# Patient Record
Sex: Male | Born: 1950 | Race: Black or African American | Hispanic: No | Marital: Married | Smoking: Former smoker
Health system: Southern US, Community
[De-identification: ages and names within clinical notes are randomized; demographics above are authoritative.]

## PROBLEM LIST (undated history)

## (undated) DIAGNOSIS — G4733 Obstructive sleep apnea (adult) (pediatric): Secondary | ICD-10-CM

## (undated) DIAGNOSIS — G4730 Sleep apnea, unspecified: Secondary | ICD-10-CM

## (undated) DIAGNOSIS — I2510 Atherosclerotic heart disease of native coronary artery without angina pectoris: Secondary | ICD-10-CM

## (undated) DIAGNOSIS — F419 Anxiety disorder, unspecified: Secondary | ICD-10-CM

## (undated) DIAGNOSIS — I252 Old myocardial infarction: Secondary | ICD-10-CM

## (undated) DIAGNOSIS — K219 Gastro-esophageal reflux disease without esophagitis: Secondary | ICD-10-CM

## (undated) DIAGNOSIS — J189 Pneumonia, unspecified organism: Secondary | ICD-10-CM

## (undated) DIAGNOSIS — M1990 Unspecified osteoarthritis, unspecified site: Secondary | ICD-10-CM

## (undated) DIAGNOSIS — I10 Essential (primary) hypertension: Secondary | ICD-10-CM

## (undated) DIAGNOSIS — E785 Hyperlipidemia, unspecified: Secondary | ICD-10-CM

## (undated) DIAGNOSIS — I253 Aneurysm of heart: Secondary | ICD-10-CM

## (undated) HISTORY — DX: Obstructive sleep apnea (adult) (pediatric): G47.33

## (undated) HISTORY — PX: HERNIA REPAIR: SHX51

## (undated) HISTORY — DX: Hyperlipidemia, unspecified: E78.5

## (undated) HISTORY — PX: CORONARY STENT PLACEMENT: SHX1402

## (undated) HISTORY — DX: Essential (primary) hypertension: I10

## (undated) HISTORY — DX: Old myocardial infarction: I25.2

## (undated) HISTORY — DX: Atherosclerotic heart disease of native coronary artery without angina pectoris: I25.10

## (undated) HISTORY — PX: CARDIAC CATHETERIZATION: SHX172

## (undated) HISTORY — DX: Aneurysm of heart: I25.3

---

## 1998-08-04 ENCOUNTER — Emergency Department (HOSPITAL_COMMUNITY): Admission: EM | Admit: 1998-08-04 | Discharge: 1998-08-04 | Payer: Self-pay | Admitting: Emergency Medicine

## 1999-02-17 ENCOUNTER — Observation Stay (HOSPITAL_COMMUNITY): Admission: EM | Admit: 1999-02-17 | Discharge: 1999-02-18 | Payer: Self-pay | Admitting: Emergency Medicine

## 1999-02-18 ENCOUNTER — Encounter: Payer: Self-pay | Admitting: Cardiology

## 1999-12-09 ENCOUNTER — Emergency Department (HOSPITAL_COMMUNITY): Admission: EM | Admit: 1999-12-09 | Discharge: 1999-12-09 | Payer: Self-pay | Admitting: Emergency Medicine

## 2000-01-22 ENCOUNTER — Encounter (INDEPENDENT_AMBULATORY_CARE_PROVIDER_SITE_OTHER): Payer: Self-pay | Admitting: Specialist

## 2000-01-22 ENCOUNTER — Ambulatory Visit (HOSPITAL_COMMUNITY): Admission: RE | Admit: 2000-01-22 | Discharge: 2000-01-22 | Payer: Self-pay | Admitting: General Surgery

## 2000-06-18 ENCOUNTER — Emergency Department (HOSPITAL_COMMUNITY): Admission: EM | Admit: 2000-06-18 | Discharge: 2000-06-19 | Payer: Self-pay | Admitting: Emergency Medicine

## 2000-06-18 ENCOUNTER — Encounter: Payer: Self-pay | Admitting: Emergency Medicine

## 2001-12-12 ENCOUNTER — Emergency Department (HOSPITAL_COMMUNITY): Admission: EM | Admit: 2001-12-12 | Discharge: 2001-12-13 | Payer: Self-pay | Admitting: Emergency Medicine

## 2003-07-29 ENCOUNTER — Emergency Department (HOSPITAL_COMMUNITY): Admission: EM | Admit: 2003-07-29 | Discharge: 2003-07-29 | Payer: Self-pay | Admitting: Emergency Medicine

## 2005-08-29 ENCOUNTER — Emergency Department (HOSPITAL_COMMUNITY): Admission: EM | Admit: 2005-08-29 | Discharge: 2005-08-29 | Payer: Self-pay | Admitting: Emergency Medicine

## 2005-12-21 ENCOUNTER — Emergency Department (HOSPITAL_COMMUNITY): Admission: EM | Admit: 2005-12-21 | Discharge: 2005-12-21 | Payer: Self-pay | Admitting: Emergency Medicine

## 2007-02-02 ENCOUNTER — Encounter: Admission: RE | Admit: 2007-02-02 | Discharge: 2007-02-02 | Payer: Self-pay | Admitting: Family Medicine

## 2008-04-05 ENCOUNTER — Emergency Department (HOSPITAL_COMMUNITY): Admission: EM | Admit: 2008-04-05 | Discharge: 2008-04-05 | Payer: Self-pay | Admitting: Emergency Medicine

## 2008-06-24 ENCOUNTER — Emergency Department (HOSPITAL_COMMUNITY): Admission: EM | Admit: 2008-06-24 | Discharge: 2008-06-24 | Payer: Self-pay | Admitting: Emergency Medicine

## 2009-05-14 ENCOUNTER — Emergency Department (HOSPITAL_COMMUNITY): Admission: EM | Admit: 2009-05-14 | Discharge: 2009-05-14 | Payer: Self-pay | Admitting: Emergency Medicine

## 2010-03-31 LAB — DIFFERENTIAL
Basophils Absolute: 0 10*3/uL (ref 0.0–0.1)
Basophils Relative: 1 % (ref 0–1)
Eosinophils Absolute: 0 10*3/uL (ref 0.0–0.7)
Eosinophils Relative: 0 % (ref 0–5)
Lymphocytes Relative: 27 % (ref 12–46)
Lymphs Abs: 1.3 10*3/uL (ref 0.7–4.0)
Monocytes Absolute: 0.5 10*3/uL (ref 0.1–1.0)
Monocytes Relative: 10 % (ref 3–12)
Neutro Abs: 3.1 10*3/uL (ref 1.7–7.7)
Neutrophils Relative %: 63 % (ref 43–77)

## 2010-03-31 LAB — COMPREHENSIVE METABOLIC PANEL
ALT: 32 U/L (ref 0–53)
AST: 28 U/L (ref 0–37)
Albumin: 4.1 g/dL (ref 3.5–5.2)
Alkaline Phosphatase: 67 U/L (ref 39–117)
BUN: 12 mg/dL (ref 6–23)
CO2: 27 mEq/L (ref 19–32)
Calcium: 8.7 mg/dL (ref 8.4–10.5)
Chloride: 107 mEq/L (ref 96–112)
Creatinine, Ser: 1.08 mg/dL (ref 0.4–1.5)
GFR calc non Af Amer: >60 mL/min (ref >60)
Glucose, Bld: 100 mg/dL — ABNORMAL HIGH (ref 70–99)
Potassium: 4.5 mEq/L (ref 3.5–5.1)
Sodium: 139 mEq/L (ref 135–145)
Total Bilirubin: 0.4 mg/dL (ref 0.3–1.2)
Total Protein: 7.1 g/dL (ref 6.0–8.3)

## 2010-03-31 LAB — APTT: aPTT: 28 seconds (ref 24–37)

## 2010-03-31 LAB — URINE MICROSCOPIC-ADD ON

## 2010-03-31 LAB — CBC
HCT: 45 % (ref 39.0–52.0)
Hemoglobin: 15.5 g/dL (ref 13.0–17.0)
MCHC: 34.5 g/dL (ref 30.0–36.0)
MCV: 92.5 fL (ref 78.0–100.0)
Platelets: 194 10*3/uL (ref 150–400)
RBC: 4.86 MIL/uL (ref 4.22–5.81)
RDW: 13.3 % (ref 11.5–15.5)
WBC: 4.9 10*3/uL (ref 4.0–10.5)

## 2010-03-31 LAB — URINE CULTURE
Colony Count: NO GROWTH
Culture: NO GROWTH

## 2010-03-31 LAB — URINALYSIS, ROUTINE W REFLEX MICROSCOPIC
Bilirubin Urine: NEGATIVE
Glucose, UA: NEGATIVE mg/dL
Ketones, ur: NEGATIVE mg/dL
Leukocytes, UA: NEGATIVE
Nitrite: NEGATIVE
Protein, ur: NEGATIVE mg/dL
Specific Gravity, Urine: 1.015 (ref 1.005–1.030)
Urobilinogen, UA: 0.2 mg/dL (ref 0.0–1.0)
pH: 6 (ref 5.0–8.0)

## 2010-03-31 LAB — PROTIME-INR
INR: 0.96 (ref 0.00–1.49)
Prothrombin Time: 12.7 seconds (ref 11.6–15.2)

## 2010-03-31 LAB — TROPONIN I

## 2010-03-31 LAB — LACTIC ACID, PLASMA: Lactic Acid, Venous: 1.4 mmol/L (ref 0.5–2.2)

## 2010-03-31 LAB — CK TOTAL AND CKMB (NOT AT ARMC)
CK, MB: 1.4 ng/mL (ref 0.3–4.0)
Relative Index: 0.7 (ref 0.0–2.5)
Total CK: 205 U/L (ref 7–232)

## 2010-04-20 LAB — POCT I-STAT, CHEM 8
BUN: 17 mg/dL (ref 6–23)
Calcium, Ion: 1.12 mmol/L (ref 1.12–1.32)
Chloride: 106 mEq/L (ref 96–112)
Creatinine, Ser: 1.1 mg/dL (ref 0.4–1.5)
Glucose, Bld: 93 mg/dL (ref 70–99)
HCT: 42 % (ref 39.0–52.0)
Hemoglobin: 14.3 g/dL (ref 13.0–17.0)
Potassium: 3.7 mEq/L (ref 3.5–5.1)
Sodium: 140 mEq/L (ref 135–145)
TCO2: 25 mmol/L (ref 0–100)

## 2010-05-29 NOTE — Op Note (Signed)
[place]  Patient:    Lawrence [name], Lawrence [name]                        MRN: [id number] Proc. Date: 01/22/00 Adm. Date:  [id number] Disc. Date: [id number] Attending:  Sonda Primes                           Operative Report  PREOPERATIVE DIAGNOSIS:  Right inguinal hernia.  POSTOPERATIVE DIAGNOSIS:  Right direct and indirect inguinal hernia.  PROCEDURE:  Right inguinal herniorrhaphy with repair with mesh patch.  SURGEON:  Mardene Celeste. Lurene Shadow, M.D.  ASSISTANT:  Nurse.  ANESTHESIA:  General.  INDICATIONS:  This patient is a 60 year old man presenting with a right-sided groin bulge extending down into the scrotum.  He was brought for repair of this right side inguinal hernia.  DESCRIPTION OF PROCEDURE:  Following the induction of anesthesia, first with sedation, the right groin was infiltrated with 1% Xylocaine with epinephrine. We eventually switched to general anesthetic due to the patients ongoing discomfort despite much medication.  Dissection was carried down through the skin and subcutaneous tissues to the external oblique aponeurosis.  This was opened up through the external inguinal ring with protection of the ilioinguinal nerve, which was retracted inferiorly and laterally.  Spermatic cord was elevated from the floor.  It contained a very large indirect sac, which was dissected free from the surrounding spermatic cord up to the internal ring.  The sac was opened and the incarcerated omentum in the sac was dissected free from the sac wall and reduced into the peritoneal cavity.  The sac was then suture ligated at its base with 2-0 silk sutures.  Redundant sac was amputated and forwarded for pathologic evaluation.  The floor of the inguinal canal then repaired with an onlay patch of Prolene mesh sewn in at the pubic tubercle with a 2-0 Novofil continued up along the conjoin tendon to the internal ring and then again from the pubic tubercle up  along the shelving edge of Pouparts ligament to the internal ring.  The mesh was split so as to allow normal protrusion of the spermatic cord and the tails of the mesh were then sutured down to the internal oblique muscles with 2-0 Prolene sutures. Sponge, instrument and sharp counts were then verified, spermatic cord returned to its usual anatomic position and the external oblique aponeurosis closed over the cord with a running 2-0 Vicryl so as to reapproximate the external ring.  Subcutaneous tissues were irrigated.  Additional bleeding points were treated with electrocautery and closed with a running suture of 3-0 Vicryl.  Skin was closed with a 4-0 Monocryl running subcuticular stitch. Wounds were then reinforced with Steri-Strips and sterile dressings were applied.  Anesthetic reversed and the patient removed from the operating room to the recovery room in stable condition.  He tolerated this procedure well. DD:  01/22/00 TD:  01/22/00 Job: [id number]

## 2010-05-29 NOTE — Discharge Summary (Signed)
[place]  Patient:    Lawrence [name], Lawrence [name]                        MRN: [id number] Adm. Date:  [id number] Disc. Date: 02/18/99 Attending:  Ronaldo Miyamoto Dictator:   Delton See, P.A. CC:         Lindell Spar. Chestine Spore, M.D.             Dietrich Pates, M.D., [facility]                           Discharge Summary  BRIEF HISTORY OF PRESENT ILLNESS:  Lawrence [name] is a 60 year old male with a history of chest pain radiating to his left arm associated with tingling of his arm. In  January of 2000, he had an exercise stress test. In January of 2000, he performed 11 minutes, 16 seconds of the Bruce protocol at 12.9 mets without EKG changes. e was seen in the emergency room in August of 2000 with similar symptoms and again February 16, 1999 at [facility]. Enzymes and an EKG revealed no  significant changes. The patient described the pain as epigastric and radiating to the left side of his chest, sharp, worse with deep inspiration, associated with  mild diaphoresis, left arm tingling, better in the emergency room following nitroglycerin x 1, no history of exertional symptoms. He was admitted on February 17, 1999 for further evaluation of the above noted symptoms.  PAST MEDICAL HISTORY:  The patient has elevated cholesterol levels with an LDL f 170. He has a history of sleep apnea and is on CPAP. He has a history of a hernia repair.  ALLERGIES:  No known drug allergies.  MEDICATIONS PRIOR TO ADMISSION:  None.  SOCIAL HISTORY:  The patient is married. He works in Airline pilot. He quit smoking 5 years ago.  HOSPITAL COURSE:  As noted, this patient was admitted for further evaluation of  atypical chest pain. He was scheduled for an exercise Cardiolite. This was performed on February 18, 1999. The patient exercised 9 minutes and 35 seconds reaching stage 4. There were no EKG changes and no symptoms other than shortness of breath and fatigue. The  preliminary Cardiolite report showed no ischemia, ejection fraction of 59%. Arrangements were made to discharge the patient later that evening with follow up with ______ . The patient also requested follow up with Dietrich Pates, M.D. He was told to call the office the following morning to get an appointment to see her.  LABORATORY DATA:  A troponin I was 0.3. A comprehensive metabolic panel was within normal limits. A CBC was within normal limits with hemoglobin 13.6, hematocrit 9, WBC 5.9000, platelets 238,000. A repeat troponin I was 0.04. Comprehensive metabolic panel on admission was also within normal limits with a BUN of 17, creatinine 1.2, potassium 3.9. A repeat CBC was also normal.  There was no chest x-ray report in the chart or in the computer at this time.  There is no EKG in the chart at this time.  DISCHARGE MEDICATIONS:  The patient was instructed to try some over-the-counter  Pepcid as needed for symptoms.  ACTIVITIES:  As tolerated.  DIET:  He was told to stay on a low-fat diet.  FOLLOW-UP:  He was to follow up with Lindell Spar. Chestine Spore, M.D. in two to three weeks, Dietrich Pates, M.D. he was told to call the office in the  morning for a follow-up appointment.  PROBLEM LIST AT TIME OF DISCHARGE: 1. Atypical chest pain. 2. Negative exercise Cardiolite performed February 18, 1999. No ischemia. Ejection    fraction 59%. 3. History of elevated LDL levels. 4. Former smoker. 5. Previous exercise treadmill January 2000 which was also negative.  The patient may need a referral to a gastroenterologist if symptoms continue. DD:  02/18/99 TD:  02/18/99 Job: [id number]

## 2010-07-22 ENCOUNTER — Telehealth: Payer: Self-pay | Admitting: Internal Medicine

## 2010-07-22 NOTE — Telephone Encounter (Signed)
I did not see any record of the pt seeing CY here in this office so I had Lawrence [name] check in old system and pt last saw CY in 2001 and the chart was here in [facility] records, so I transferred Lawrence [name] to [employer]. Lawrence [name], CMA

## 2012-01-04 ENCOUNTER — Emergency Department (INDEPENDENT_AMBULATORY_CARE_PROVIDER_SITE_OTHER): Payer: BC Managed Care – PPO

## 2012-01-04 ENCOUNTER — Emergency Department (HOSPITAL_COMMUNITY)
Admission: EM | Admit: 2012-01-04 | Discharge: 2012-01-04 | Disposition: A | Discharge status: Home / Self Care | Payer: BC Managed Care – PPO | Source: Home / Self Care | Attending: Emergency Medicine | Admitting: Emergency Medicine

## 2012-01-04 ENCOUNTER — Encounter (HOSPITAL_COMMUNITY): Payer: Self-pay | Admitting: *Deleted

## 2012-01-04 DIAGNOSIS — J209 Acute bronchitis, unspecified: Secondary | ICD-10-CM

## 2012-01-04 DIAGNOSIS — J986 Disorders of diaphragm: Secondary | ICD-10-CM

## 2012-01-04 HISTORY — DX: Essential (primary) hypertension: I10

## 2012-01-04 MED ORDER — AZITHROMYCIN 250 MG PO TABS: ORAL_TABLET | ORAL | Status: DC

## 2012-01-04 MED ORDER — HYDROCOD POLST-CHLORPHEN POLST 10-8 MG/5ML PO LQCR: 5.0000 mL | Freq: Two times a day (BID) | ORAL | Status: DC | PRN

## 2012-01-04 MED ORDER — ALBUTEROL SULFATE HFA 108 (90 BASE) MCG/ACT IN AERS: 2.0000 | INHALATION_SPRAY | Freq: Four times a day (QID) | RESPIRATORY_TRACT | Status: DC | PRN

## 2012-01-04 NOTE — ED Notes (Signed)
Pt  Reports  Symptoms  Of  Cough  /  Congested     As     Well  As  Discomfort  In  Chest  When  He  Coughs   He  Reports   Yellow  /  Blood  Tinged       Mucous       Returned        He  Is  Sitting  Upright on  Exam table  He  Is  Speaking in  Complete  sentances   He   Appears  In no    Cute  Distress

## 2012-01-04 NOTE — ED Provider Notes (Signed)
Chief Complaint  Patient presents with  . 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 He denies any wheezing, shortness of breath, fever, or chills. He has had no GI symptoms. He also has high blood pressure and hyperlipidemia and is on lisinopril and pravastatin.  Review of Systems:  Other than noted above, the patient denies any of the following symptoms. Systemic:  No fever, chills, sweats, fatigue, myalgias, headache, or anorexia. Eye:  No redness, pain or drainage. ENT:  No earache, ear congestion, nasal congestion, sneezing, rhinorrhea, sinus pressure, sinus pain, post nasal drip, or sore throat. Lungs:  No cough, sputum production, wheezing, shortness of breath, or chest pain. GI:  No abdominal pain, nausea, vomiting, or diarrhea.  PMFSH:  Past medical history, family history, social history, meds, and allergies were reviewed.  Physical Exam:   Vital signs:  BP 145/95  Pulse 96  Temp 98.4 F (36.9 C) (Oral)  Resp 18  SpO2 96% General:  Alert, in no distress. Eye:  No conjunctival injection or drainage. Lids were normal. ENT:  TMs and canals were normal, without erythema or inflammation.  Nasal mucosa was clear and uncongested, without drainage.  Mucous membranes were moist.  Pharynx was clear, without exudate or drainage.  There were no oral ulcerations or lesions. Neck:  Supple, no adenopathy, tenderness or mass. Lungs:  No respiratory distress.  Lungs were clear to auscultation, without wheezes, rales or rhonchi.  Breath sounds were clear and equal bilaterally.  Heart:  Regular rhythm, without gallops, murmers or rubs. Skin:  Clear, warm, and dry, without rash or lesions.  Radiology:  Dg Chest 2 View  01/04/2012  *RADIOLOGY REPORT*  Clinical Data: Cough and hemoptysis   CHEST - 2 VIEW  Comparison: May 14, 2009  Findings: There is stable elevation of the right hemidiaphragm. Lungs clear.  Heart size and pulmonary vascularity are normal.  No adenopathy.  No bone lesions.  IMPRESSION: No edema or consolidation.  If hemoptysis persists, correlation with chest CT may be advisable.   Original Report Authenticated By: Bretta Bang, M.D.    I reviewed the images independently and personally and concur with the radiologist's findings.  Assessment:  The primary encounter diagnosis was Acute bronchitis. 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 Patient was unaware that this has been diagnosed with this. He's not had any previous respiratory problems. I do not think this requires any further workup. I told him that as result of this he may be more susceptible to getting pneumonia, thus I elected to go ahead and start a Z-Pak, even though he only been sick for 2 or 3 days.  Plan:   1.  The following meds were prescribed:   New Prescriptions   ALBUTEROL (PROVENTIL HFA;VENTOLIN HFA) 108 (90 BASE) MCG/ACT INHALER    Inhale 2 puffs into the lungs every 6 (six) hours as needed for wheezing.   AZITHROMYCIN (ZITHROMAX Z-PAK) 250 MG TABLET    Take as directed.   CHLORPHENIRAMINE-HYDROCODONE (TUSSIONEX) 10-8 MG/5ML LQCR    Take 5 mLs by mouth every 12 (twelve) hours as needed.   2.  The patient was instructed in symptomatic care and  handouts were given. 3.  The patient was told to return if becoming worse in any way, if no better in 3 or 4 days, and given some red flag symptoms that would indicate earlier return.   Reuben Likes, MD 01/04/12 1332

## 2012-01-22 ENCOUNTER — Inpatient Hospital Stay (HOSPITAL_COMMUNITY)
Admission: EM | Admit: 2012-01-22 | Discharge: 2012-01-27 | DRG: 808 | Disposition: A | Discharge status: Home / Self Care | Payer: BC Managed Care – PPO | Attending: Interventional Cardiology | Admitting: Interventional Cardiology

## 2012-01-22 ENCOUNTER — Encounter (HOSPITAL_COMMUNITY): Payer: Self-pay | Admitting: *Deleted

## 2012-01-22 DIAGNOSIS — G4733 Obstructive sleep apnea (adult) (pediatric): Secondary | ICD-10-CM | POA: Diagnosis present

## 2012-01-22 DIAGNOSIS — I214 Non-ST elevation (NSTEMI) myocardial infarction: Principal | ICD-10-CM | POA: Diagnosis present

## 2012-01-22 DIAGNOSIS — I2582 Chronic total occlusion of coronary artery: Secondary | ICD-10-CM | POA: Diagnosis present

## 2012-01-22 DIAGNOSIS — R079 Chest pain, unspecified: Secondary | ICD-10-CM | POA: Diagnosis present

## 2012-01-22 DIAGNOSIS — E669 Obesity, unspecified: Secondary | ICD-10-CM | POA: Diagnosis present

## 2012-01-22 DIAGNOSIS — I10 Essential (primary) hypertension: Secondary | ICD-10-CM | POA: Diagnosis present

## 2012-01-22 DIAGNOSIS — I253 Aneurysm of heart: Secondary | ICD-10-CM | POA: Diagnosis present

## 2012-01-22 DIAGNOSIS — E785 Hyperlipidemia, unspecified: Secondary | ICD-10-CM | POA: Diagnosis present

## 2012-01-22 DIAGNOSIS — I252 Old myocardial infarction: Secondary | ICD-10-CM | POA: Diagnosis not present

## 2012-01-22 DIAGNOSIS — I2510 Atherosclerotic heart disease of native coronary artery without angina pectoris: Secondary | ICD-10-CM | POA: Diagnosis present

## 2012-01-22 DIAGNOSIS — Z7901 Long term (current) use of anticoagulants: Secondary | ICD-10-CM

## 2012-01-22 DIAGNOSIS — F17200 Nicotine dependence, unspecified, uncomplicated: Secondary | ICD-10-CM | POA: Diagnosis present

## 2012-01-22 NOTE — ED Notes (Signed)
Pt c/o sudden onset of burning chest pain when he was climbing stairs. Pt c/o slight radiation to L arm. Pt denies prior cardiac hx beyond HTN. Pt denies n/v at this time. Pt presents as pale, slightly diaphoretic and in some visible distress.

## 2012-01-23 ENCOUNTER — Emergency Department (HOSPITAL_COMMUNITY): Payer: BC Managed Care – PPO

## 2012-01-23 ENCOUNTER — Encounter (HOSPITAL_COMMUNITY)
Admission: EM | Disposition: A | Discharge status: Home / Self Care | Payer: Self-pay | Source: Home / Self Care | Attending: Interventional Cardiology

## 2012-01-23 ENCOUNTER — Encounter (HOSPITAL_COMMUNITY): Payer: Self-pay | Admitting: Emergency Medicine

## 2012-01-23 DIAGNOSIS — I10 Essential (primary) hypertension: Secondary | ICD-10-CM | POA: Diagnosis present

## 2012-01-23 DIAGNOSIS — I252 Old myocardial infarction: Secondary | ICD-10-CM

## 2012-01-23 DIAGNOSIS — R079 Chest pain, unspecified: Secondary | ICD-10-CM

## 2012-01-23 HISTORY — DX: Essential (primary) hypertension: I10

## 2012-01-23 HISTORY — PX: LEFT HEART CATHETERIZATION WITH CORONARY ANGIOGRAM: SHX5451

## 2012-01-23 HISTORY — DX: Old myocardial infarction: I25.2

## 2012-01-23 LAB — PRO B NATRIURETIC PEPTIDE
Pro B Natriuretic peptide (BNP): <5 pg/mL (ref 0–125)
Pro B Natriuretic peptide (BNP): 128.6 pg/mL — ABNORMAL HIGH (ref 0–125)

## 2012-01-23 LAB — CBC WITH DIFFERENTIAL/PLATELET
Basophils Absolute: 0.1 10*3/uL (ref 0.0–0.1)
Basophils Relative: 1 % (ref 0–1)
Eosinophils Absolute: 0.4 10*3/uL (ref 0.0–0.7)
Eosinophils Relative: 3 % (ref 0–5)
HCT: 42.2 % (ref 39.0–52.0)
Hemoglobin: 14.9 g/dL (ref 13.0–17.0)
Lymphocytes Relative: 56 % — ABNORMAL HIGH (ref 12–46)
Lymphs Abs: 6.8 10*3/uL — ABNORMAL HIGH (ref 0.7–4.0)
MCH: 32.1 pg (ref 26.0–34.0)
MCHC: 35.3 g/dL (ref 30.0–36.0)
MCV: 90.9 fL (ref 78.0–100.0)
Monocytes Absolute: 0.8 10*3/uL (ref 0.1–1.0)
Monocytes Relative: 7 % (ref 3–12)
Neutro Abs: 4 10*3/uL (ref 1.7–7.7)
Neutrophils Relative %: 33 % — ABNORMAL LOW (ref 43–77)
Platelets: 256 10*3/uL (ref 150–400)
RBC: 4.64 MIL/uL (ref 4.22–5.81)
RDW: 13.3 % (ref 11.5–15.5)
WBC: 12.1 10*3/uL — ABNORMAL HIGH (ref 4.0–10.5)

## 2012-01-23 LAB — TROPONIN I
Troponin I: 1.52 ng/mL (ref <0.30)
Troponin I: 8.02 ng/mL (ref <0.30)
Troponin I: >20 ng/mL (ref <0.30)

## 2012-01-23 LAB — POCT I-STAT, CHEM 8
BUN: 23 mg/dL (ref 6–23)
Calcium, Ion: 1.08 mmol/L — ABNORMAL LOW (ref 1.12–1.23)
Chloride: 108 mEq/L (ref 96–112)
Creatinine, Ser: 1.2 mg/dL (ref 0.50–1.35)
Glucose, Bld: 109 mg/dL — ABNORMAL HIGH (ref 70–99)
HCT: 44 % (ref 39.0–52.0)
Hemoglobin: 15 g/dL (ref 13.0–17.0)
Potassium: 4 mEq/L (ref 3.5–5.1)
Sodium: 139 mEq/L (ref 135–145)
TCO2: 25 mmol/L (ref 0–100)

## 2012-01-23 LAB — CBC
HCT: 41.1 % (ref 39.0–52.0)
Hemoglobin: 14.1 g/dL (ref 13.0–17.0)
MCH: 31.4 pg (ref 26.0–34.0)
MCHC: 34.3 g/dL (ref 30.0–36.0)
MCV: 91.5 fL (ref 78.0–100.0)
Platelets: 253 10*3/uL (ref 150–400)
RBC: 4.49 MIL/uL (ref 4.22–5.81)
RDW: 13.3 % (ref 11.5–15.5)
WBC: 8.1 10*3/uL (ref 4.0–10.5)

## 2012-01-23 LAB — BASIC METABOLIC PANEL
BUN: 17 mg/dL (ref 6–23)
CO2: 22 mEq/L (ref 19–32)
Calcium: 8.9 mg/dL (ref 8.4–10.5)
Chloride: 101 mEq/L (ref 96–112)
Creatinine, Ser: 0.88 mg/dL (ref 0.50–1.35)
GFR calc Af Amer: >90 mL/min (ref >90)
GFR calc non Af Amer: >90 mL/min (ref >90)
Glucose, Bld: 151 mg/dL — ABNORMAL HIGH (ref 70–99)
Potassium: 4.2 mEq/L (ref 3.5–5.1)
Sodium: 137 mEq/L (ref 135–145)

## 2012-01-23 LAB — PROTIME-INR
INR: 1 (ref 0.00–1.49)
Prothrombin Time: 13.1 seconds (ref 11.6–15.2)

## 2012-01-23 LAB — MAGNESIUM: Magnesium: 2 mg/dL (ref 1.5–2.5)

## 2012-01-23 LAB — POCT I-STAT TROPONIN I: Troponin i, poc: 0 ng/mL (ref 0.00–0.08)

## 2012-01-23 LAB — LIPID PANEL
Cholesterol: 252 mg/dL — ABNORMAL HIGH (ref 0–200)
HDL: 41 mg/dL (ref >39)
LDL Cholesterol: 167 mg/dL — ABNORMAL HIGH (ref 0–99)
Total CHOL/HDL Ratio: 6.1 RATIO
Triglycerides: 220 mg/dL — ABNORMAL HIGH (ref <150)
VLDL: 44 mg/dL — ABNORMAL HIGH (ref 0–40)

## 2012-01-23 LAB — TSH: TSH: 2.476 u[IU]/mL (ref 0.350–4.500)

## 2012-01-23 LAB — APTT: aPTT: 29 seconds (ref 24–37)

## 2012-01-23 LAB — HEMOGLOBIN A1C
Hgb A1c MFr Bld: 5.9 % — ABNORMAL HIGH (ref <5.7)
Mean Plasma Glucose: 123 mg/dL — ABNORMAL HIGH (ref <117)

## 2012-01-23 LAB — MRSA PCR SCREENING: MRSA by PCR: NEGATIVE

## 2012-01-23 SURGERY — LEFT HEART CATHETERIZATION WITH CORONARY ANGIOGRAM
Anesthesia: LOCAL

## 2012-01-23 MED ORDER — SODIUM CHLORIDE 0.9 % IJ SOLN: 3.0000 mL | Freq: Two times a day (BID) | INTRAMUSCULAR | Status: DC

## 2012-01-23 MED ORDER — NITROGLYCERIN 0.2 MG/ML ON CALL CATH LAB
INTRAVENOUS | Status: AC
  Filled 2012-01-23: qty 1

## 2012-01-23 MED ORDER — ASPIRIN EC 81 MG PO TBEC: 81.0000 mg | DELAYED_RELEASE_TABLET | Freq: Every day | ORAL | Status: DC

## 2012-01-23 MED ORDER — BIVALIRUDIN 250 MG IV SOLR
INTRAVENOUS | Status: AC
  Filled 2012-01-23: qty 250

## 2012-01-23 MED ORDER — IOHEXOL 350 MG/ML SOLN: 100.0000 mL | Freq: Once | INTRAVENOUS | Status: DC | PRN

## 2012-01-23 MED ORDER — SODIUM CHLORIDE 0.9 % IV SOLN: INTRAVENOUS | Status: DC

## 2012-01-23 MED ORDER — NITROGLYCERIN 0.4 MG SL SUBL
0.4000 mg | SUBLINGUAL_TABLET | SUBLINGUAL | Status: DC | PRN
  Administered 2012-01-23 (×2): 0.4 mg via SUBLINGUAL
  Filled 2012-01-23: qty 25

## 2012-01-23 MED ORDER — TICAGRELOR 90 MG PO TABS
90.0000 mg | ORAL_TABLET | Freq: Two times a day (BID) | ORAL | Status: DC
  Filled 2012-01-23 (×2): qty 1

## 2012-01-23 MED ORDER — LIDOCAINE HCL (PF) 1 % IJ SOLN
INTRAMUSCULAR | Status: AC
  Filled 2012-01-23: qty 30

## 2012-01-23 MED ORDER — FENTANYL CITRATE 0.05 MG/ML IJ SOLN
100.0000 ug | Freq: Once | INTRAMUSCULAR | Status: AC
  Administered 2012-01-23: 100 ug via INTRAVENOUS
  Filled 2012-01-23: qty 2

## 2012-01-23 MED ORDER — OXYCODONE-ACETAMINOPHEN 5-325 MG PO TABS
1.0000 | ORAL_TABLET | ORAL | Status: DC | PRN
  Administered 2012-01-23 (×2): 2 via ORAL
  Filled 2012-01-23 (×2): qty 2

## 2012-01-23 MED ORDER — DIAZEPAM 2 MG PO TABS: 2.0000 mg | ORAL_TABLET | ORAL | Status: DC

## 2012-01-23 MED ORDER — ATORVASTATIN CALCIUM 80 MG PO TABS
80.0000 mg | ORAL_TABLET | Freq: Every day | ORAL | Status: DC
  Administered 2012-01-23 – 2012-01-26 (×4): 80 mg via ORAL
  Filled 2012-01-23 (×5): qty 1

## 2012-01-23 MED ORDER — LISINOPRIL 10 MG PO TABS: 10.0000 mg | ORAL_TABLET | Freq: Every day | ORAL | Status: DC

## 2012-01-23 MED ORDER — ENOXAPARIN SODIUM 40 MG/0.4ML ~~LOC~~ SOLN
40.0000 mg | SUBCUTANEOUS | Status: DC
  Administered 2012-01-24: 40 mg via SUBCUTANEOUS
  Filled 2012-01-23 (×2): qty 0.4

## 2012-01-23 MED ORDER — SODIUM CHLORIDE 0.9 % IV SOLN
INTRAVENOUS | Status: AC
  Administered 2012-01-23: 13:00:00 via INTRAVENOUS

## 2012-01-23 MED ORDER — FENTANYL CITRATE 0.05 MG/ML IJ SOLN
INTRAMUSCULAR | Status: AC
  Filled 2012-01-23: qty 2

## 2012-01-23 MED ORDER — METOPROLOL TARTRATE 25 MG PO TABS
25.0000 mg | ORAL_TABLET | Freq: Two times a day (BID) | ORAL | Status: DC
  Administered 2012-01-23: 25 mg via ORAL
  Administered 2012-01-23: 12.5 mg via ORAL
  Filled 2012-01-23 (×4): qty 1

## 2012-01-23 MED ORDER — NITROGLYCERIN 0.4 MG SL SUBL: 0.4000 mg | SUBLINGUAL_TABLET | SUBLINGUAL | Status: DC | PRN

## 2012-01-23 MED ORDER — IOHEXOL 350 MG/ML SOLN
125.0000 mL | Freq: Once | INTRAVENOUS | Status: AC | PRN
  Administered 2012-01-23: 125 mL via INTRAVENOUS

## 2012-01-23 MED ORDER — MORPHINE SULFATE 2 MG/ML IJ SOLN
4.0000 mg | INTRAMUSCULAR | Status: DC | PRN
  Administered 2012-01-23: 2 mg via INTRAVENOUS
  Administered 2012-01-23 (×3): 4 mg via INTRAVENOUS
  Filled 2012-01-23: qty 2
  Filled 2012-01-23: qty 1
  Filled 2012-01-23: qty 2
  Filled 2012-01-23: qty 1

## 2012-01-23 MED ORDER — METOPROLOL TARTRATE 12.5 MG HALF TABLET
12.5000 mg | ORAL_TABLET | Freq: Two times a day (BID) | ORAL | Status: DC
  Administered 2012-01-23 (×2): 12.5 mg via ORAL
  Filled 2012-01-23 (×3): qty 1

## 2012-01-23 MED ORDER — TICAGRELOR 90 MG PO TABS
ORAL_TABLET | ORAL | Status: AC
  Filled 2012-01-23: qty 2

## 2012-01-23 MED ORDER — ASPIRIN 81 MG PO CHEW
81.0000 mg | CHEWABLE_TABLET | Freq: Every day | ORAL | Status: DC
  Administered 2012-01-23 – 2012-01-27 (×5): 81 mg via ORAL
  Filled 2012-01-23 (×5): qty 1

## 2012-01-23 MED ORDER — KETOROLAC TROMETHAMINE 60 MG/2ML IM SOLN
60.0000 mg | Freq: Once | INTRAMUSCULAR | Status: AC
  Administered 2012-01-23: 60 mg via INTRAMUSCULAR
  Filled 2012-01-23: qty 2

## 2012-01-23 MED ORDER — ONDANSETRON HCL 4 MG/2ML IJ SOLN: 4.0000 mg | Freq: Four times a day (QID) | INTRAMUSCULAR | Status: DC | PRN

## 2012-01-23 MED ORDER — ENOXAPARIN SODIUM 40 MG/0.4ML ~~LOC~~ SOLN
40.0000 mg | SUBCUTANEOUS | Status: DC
  Filled 2012-01-23: qty 0.4

## 2012-01-23 MED ORDER — ASPIRIN 325 MG PO TABS: 325.0000 mg | ORAL_TABLET | ORAL | Status: DC

## 2012-01-23 MED ORDER — TICAGRELOR 90 MG PO TABS
90.0000 mg | ORAL_TABLET | Freq: Two times a day (BID) | ORAL | Status: DC
  Administered 2012-01-23: 90 mg via ORAL
  Filled 2012-01-23 (×3): qty 1

## 2012-01-23 MED ORDER — SODIUM CHLORIDE 0.9 % IV SOLN: 250.0000 mL | INTRAVENOUS | Status: DC | PRN

## 2012-01-23 MED ORDER — ACETAMINOPHEN 325 MG PO TABS: 650.0000 mg | ORAL_TABLET | ORAL | Status: DC | PRN

## 2012-01-23 MED ORDER — HEPARIN (PORCINE) IN NACL 2-0.9 UNIT/ML-% IJ SOLN
INTRAMUSCULAR | Status: AC
  Filled 2012-01-23: qty 1500

## 2012-01-23 MED ORDER — LISINOPRIL 10 MG PO TABS
10.0000 mg | ORAL_TABLET | Freq: Every day | ORAL | Status: DC
  Administered 2012-01-23 – 2012-01-24 (×2): 10 mg via ORAL
  Filled 2012-01-23 (×3): qty 1

## 2012-01-23 MED ORDER — ASPIRIN 81 MG PO CHEW: 324.0000 mg | CHEWABLE_TABLET | ORAL | Status: DC

## 2012-01-23 MED ORDER — ASPIRIN 81 MG PO CHEW
CHEWABLE_TABLET | ORAL | Status: AC
  Filled 2012-01-23: qty 4

## 2012-01-23 MED ORDER — ASPIRIN EC 81 MG PO TBEC
81.0000 mg | DELAYED_RELEASE_TABLET | Freq: Every morning | ORAL | Status: DC
  Filled 2012-01-23: qty 1

## 2012-01-23 MED ORDER — ACETAMINOPHEN 325 MG PO TABS
650.0000 mg | ORAL_TABLET | ORAL | Status: DC | PRN
  Administered 2012-01-23: 650 mg via ORAL
  Filled 2012-01-23: qty 2

## 2012-01-23 MED ORDER — ENOXAPARIN SODIUM 120 MG/0.8ML ~~LOC~~ SOLN
110.0000 mg | Freq: Two times a day (BID) | SUBCUTANEOUS | Status: DC
  Filled 2012-01-23 (×2): qty 0.8

## 2012-01-23 MED ORDER — VERAPAMIL HCL 2.5 MG/ML IV SOLN
INTRAVENOUS | Status: AC
  Filled 2012-01-23: qty 2

## 2012-01-23 MED ORDER — NITROGLYCERIN IN D5W 200-5 MCG/ML-% IV SOLN: 3.0000 ug/min | INTRAVENOUS | Status: DC

## 2012-01-23 MED ORDER — ENOXAPARIN SODIUM 120 MG/0.8ML ~~LOC~~ SOLN
110.0000 mg | Freq: Two times a day (BID) | SUBCUTANEOUS | Status: DC
  Filled 2012-01-23 (×3): qty 0.8

## 2012-01-23 MED ORDER — MIDAZOLAM HCL 2 MG/2ML IJ SOLN
INTRAMUSCULAR | Status: AC
  Filled 2012-01-23: qty 2

## 2012-01-23 MED ORDER — NITROGLYCERIN IN D5W 200-5 MCG/ML-% IV SOLN
5.0000 ug/min | Freq: Once | INTRAVENOUS | Status: AC
  Administered 2012-01-23: 5 ug/min via INTRAVENOUS
  Filled 2012-01-23: qty 250

## 2012-01-23 MED ORDER — NITROGLYCERIN IN D5W 200-5 MCG/ML-% IV SOLN: 5.0000 ug/min | INTRAVENOUS | Status: DC

## 2012-01-23 MED ORDER — AMLODIPINE BESYLATE 10 MG PO TABS
10.0000 mg | ORAL_TABLET | Freq: Once | ORAL | Status: AC
  Administered 2012-01-23: 10 mg via ORAL
  Filled 2012-01-23: qty 1

## 2012-01-23 MED ORDER — SODIUM CHLORIDE 0.9 % IV SOLN: 0.2500 mg/kg/h | INTRAVENOUS | Status: DC

## 2012-01-23 MED ORDER — SODIUM CHLORIDE 0.9 % IJ SOLN: 3.0000 mL | INTRAMUSCULAR | Status: DC | PRN

## 2012-01-23 MED ORDER — GI COCKTAIL ~~LOC~~
30.0000 mL | Freq: Once | ORAL | Status: AC
  Administered 2012-01-23: 30 mL via ORAL
  Filled 2012-01-23: qty 30

## 2012-01-23 MED ORDER — ASPIRIN 81 MG PO CHEW
324.0000 mg | CHEWABLE_TABLET | Freq: Once | ORAL | Status: AC
  Administered 2012-01-23: 324 mg via ORAL
  Filled 2012-01-23: qty 4

## 2012-01-23 MED ORDER — HEPARIN SODIUM (PORCINE) 1000 UNIT/ML IJ SOLN
INTRAMUSCULAR | Status: AC
  Filled 2012-01-23: qty 1

## 2012-01-23 NOTE — Progress Notes (Signed)
ANTICOAGULATION CONSULT NOTE - Initial Consult  Pharmacy Consult for Lovenox Indication: chest pain/ACS  No Known Allergies  Patient Measurements: Height: 5\' 11"  (180.3 cm) Weight: 236 lb 1.8 oz (107.1 kg) IBW/kg (Calculated) : 75.3   Vital Signs: Temp: 97.6 F (36.4 C) (01/12 0430) Temp src: Oral (01/12 0430) BP: 174/102 mmHg (01/12 0508) Pulse Rate: 93  (01/12 0508)  Labs:  Basename 01/23/12 0537 01/23/12 0112 01/23/12 0100  HGB 14.1 15.0 --  HCT 41.1 44.0 42.2  PLT 253 -- 256  APTT 29 -- --  LABPROT 13.1 -- --  INR 1.00 -- --  HEPARINUNFRC -- -- --  CREATININE 0.88 1.20 --  CKTOTAL -- -- --  CKMB -- -- --  TROPONINI 1.52* -- --    Estimated Creatinine Clearance: 112.5 ml/min (by C-G formula based on Cr of 0.88).   Medical History: Past Medical History  Diagnosis Date  . Hypertension   . Bronchitis     Medications:  Scheduled:    . [COMPLETED] amLODipine  10 mg Oral Once  . aspirin      . [COMPLETED] aspirin  324 mg Oral Once  . aspirin EC  81 mg Oral q morning - 10a  . enoxaparin (LOVENOX) injection  40 mg Subcutaneous Q24H  . [COMPLETED] fentaNYL  100 mcg Intravenous Once  . [COMPLETED] gi cocktail  30 mL Oral Once  . [COMPLETED] ketorolac  60 mg Intramuscular Once  . lisinopril  10 mg Oral Daily  . metoprolol tartrate  12.5 mg Oral BID  . [COMPLETED] nitroGLYCERIN  5 mcg/min Intravenous Once  . [DISCONTINUED] aspirin EC  81 mg Oral Daily  . [DISCONTINUED] aspirin  325 mg Oral STAT   Infusions:    . nitroGLYCERIN 15 mcg/min (01/23/12 0508)   PRN: acetaminophen, [COMPLETED] iohexol, morphine, nitroGLYCERIN, ondansetron (ZOFRAN) IV, [DISCONTINUED] iohexol, [DISCONTINUED] nitroGLYCERIN  Assessment:  61 YOM admit 01/23/12 with c/o chest pain with hx of HTN and hyperlipedemia.  Troponin elevated (1.52)  Renal function wnl, CrCl ~ 92 ml/min (normalized)  CBC is wnl  Goal of Therapy:  Anti-Xa level 0.6-1.2 units/ml 4hrs after LMWH dose  given Monitor platelets by anticoagulation protocol: Yes   Plan:   Lovenox 1 mg/kg (110mg ) SQ q12h   CBC and SCr at least [address] PharmD, BCPS Pager [phone] 01/23/2012 7:21 AM

## 2012-01-23 NOTE — Progress Notes (Signed)
Patient transferred from the 4th floor with active CP. Patient rating CP 5/10. 4 mg IV Morphine given. No change in physical assessment from report given. Patient is A&Ox3, independent with all activities.

## 2012-01-23 NOTE — H&P (Addendum)
Lawrence [name] is an 62 y.o. male.    Chief Complaint: Chest pain  HPI: 62 y/o male with a past medical history of HTN and hyperlipidemia presenting for chest pain evaluation.  His chest pain started at about 10 pm while he was climbing stairs. The patient is a poor historian, but he describes the chest pain as aching, 6/10 in severity, increases with inspiration and associated with shortness of breath.  He denies radiation, nausea/vomiting, diaphoresis, PND, orthopnea or syncope. His chest pain has been persistent since presentation, and he is still having 6/10 chest pain. He has no family history of premature CAD, and he does not smoke cigarettes. His ECG shows sinus rhythm (87 bpm), LAFB no ST or T wave changes to suggest ischemia. Patient's wife states that the patient had upper respiratory tract infection about 3 weeks prior to presentation.  His prior evaluation of chest pain in the past includes a negative exercise stress test 01/1998 and a nuclear stress test 02/18/1999 which was negative for ischemia, LVEF 59%. He has a prior history of non-compliant with his anti-hypertensive medication.   Past Medical History  Diagnosis Date  . Hypertension   . Bronchitis     History reviewed. No pertinent past surgical history.  History reviewed. No pertinent family history. Social History:  reports that he has been smoking Cigars.  He does not have any smokeless tobacco history on file. He reports that he does not drink alcohol. His drug history not on file.  Allergies: No Known Allergies   (Not in a hospital admission)  Results for orders placed during the hospital encounter of 01/22/12 (from the past 48 hour(s))  PRO B NATRIURETIC PEPTIDE     Status: Normal   Collection Time   01/23/12 12:53 AM      Component Value Range Comment   Pro B Natriuretic peptide (BNP) <5.0  0 - 125 pg/mL   POCT I-STAT TROPONIN I     Status: Normal   Collection Time   01/23/12  1:10 AM      Component Value Range  Comment   Troponin i, poc 0.00  0.00 - 0.08 ng/mL    Comment 3            POCT I-STAT, CHEM 8     Status: Abnormal   Collection Time   01/23/12  1:12 AM      Component Value Range Comment   Sodium 139  135 - 145 mEq/L    Potassium 4.0  3.5 - 5.1 mEq/L    Chloride 108  96 - 112 mEq/L    BUN 23  6 - 23 mg/dL    Creatinine, Ser 2.13  0.50 - 1.35 mg/dL    Glucose, Bld 086 (*) 70 - 99 mg/dL    Calcium, Ion 5.78 (*) 1.12 - 1.23 mmol/L    TCO2 25  0 - 100 mmol/L    Hemoglobin 15.0  13.0 - 17.0 g/dL    HCT 46.9  62.9 - 52.8 %    Dg Chest Port 1 View  01/23/2012  *RADIOLOGY REPORT*  Clinical Data: Chest pain, shortness of breath.  PORTABLE CHEST - 1 VIEW  Comparison: 01/04/2012  Findings: Very low lung volumes.  Heart is mildly enlarged. Vascular congestion, interstitial prominence and bibasilar atelectasis.  No effusions.  No acute bony abnormality.  IMPRESSION: Very low lung volumes with bibasilar atelectasis.  Mild cardiomegaly and vascular congestion.  Cannot exclude interstitial edema.   Original Report Authenticated By: Caryn Bee  Kearney Hard, M.D.     Review of Systems  Constitutional: Negative for fever, chills, weight loss, malaise/fatigue and diaphoresis.  HENT: Negative for hearing loss, ear pain, nosebleeds, congestion, neck pain, tinnitus and ear discharge.   Eyes: Negative for blurred vision, double vision, photophobia, pain, discharge and redness.  Respiratory: Positive for shortness of breath. Negative for cough, hemoptysis, sputum production, wheezing and stridor.   Cardiovascular: Positive for chest pain. Negative for palpitations, orthopnea, claudication, leg swelling and PND.  Gastrointestinal: Negative for heartburn, nausea, vomiting, abdominal pain, diarrhea, constipation, blood in stool and melena.  Genitourinary: Negative for dysuria, urgency, frequency and hematuria.  Musculoskeletal: Negative for myalgias, back pain and joint pain.  Skin: Negative for itching and rash.    Neurological: Negative for dizziness, tingling, tremors, sensory change, speech change, focal weakness, seizures, loss of consciousness, weakness and headaches.  Endo/Heme/Allergies: Negative for environmental allergies and polydipsia. Does not bruise/bleed easily.  Psychiatric/Behavioral: Negative for depression, suicidal ideas, hallucinations and substance abuse. The patient is not nervous/anxious and does not have insomnia.     Blood pressure 139/92, pulse 86, resp. rate 19, height 5\' 11"  (1.803 m), weight 105.235 kg (232 lb), SpO2 98.00%. Physical Exam  Constitutional: He is oriented to person, place, and time. He appears well-developed and well-nourished. No distress.  HENT:  Head: Normocephalic and atraumatic.  Eyes: EOM are normal. Pupils are equal, round, and reactive to light. Right eye exhibits no discharge. Left eye exhibits no discharge. No scleral icterus.  Neck: Normal range of motion. Neck supple. No JVD present.  Cardiovascular: Normal rate, regular rhythm, normal heart sounds and intact distal pulses.  Exam reveals no gallop and no friction rub.   No murmur heard. Respiratory: Effort normal and breath sounds normal. No stridor. No respiratory distress. He has no wheezes. He has no rales. He exhibits no tenderness.  GI: Soft. Bowel sounds are normal. He exhibits no distension. There is no tenderness. There is no rebound and no guarding.  Musculoskeletal: Normal range of motion. He exhibits no edema and no tenderness.  Neurological: He is alert and oriented to person, place, and time. No cranial nerve deficit.  Skin: No rash noted. He is not diaphoretic. No pallor.  Psychiatric: He has a normal mood and affect.     Assessment/Plan  1. Chest pain 2. Hypertension 3. Hyperlipidemia 4. Sleep Apnea  I will admit the patient to cardiology to be observed on telemetry. I will obtain serial cardiac markers to rule out myocardial infarction and start him on Aspirin, low-dose beta  blockers and continue his Lisinopril and Statin.  Since he reportedly had upper respiratory tract infection about 3 weeks ago and he has pleuritic chest pain, I will give him Toradol tonight to see if that eases his chest pain. I will also start him on Nitro drip since he is still having 6/10 chest pain and titrate to chest pain free.  We will review his labs and re-evaluate the patient in the morning to determine if he should get a nuclear stress test (ordered) or a cardiac catheterization (if he has dynamic ECG changes or he rules in for MI). I will obtain fasting lipids in the morning.    Addendum 7am: Called by nurse that second set of troponin is now abnormal. Lovenox started. He is still on Nitro-drip. Will d/c stress test since he will likely need a cardiac cath and transfer to [facility] cardiac ICU.   Elmer Boutelle E 01/23/2012, 2:36 AM

## 2012-01-23 NOTE — ED Notes (Signed)
Patient transported to CT 

## 2012-01-23 NOTE — H&P (Signed)
This is the updated history and physical on Mr. Lawrence [name], a 62 year old gentleman who developed chest discomfort while climbing stairs last evening. The pain was of moderate to severe intensity. He came to the emergency room of [facility] where initial EKGs demonstrated left anterior hemiblock. A CT angiography chest was performed demonstrating left coronary calcification. He subsequently had resolution of discomfort that recurred early this morning. A troponin, which was initially normal is not elevated at 1.5. He is now classified as an acute coronary syndrome. Dr. Mayford Knife has requested that we cath the patient urgently. He has history of hypertension, and hyperlipidemia.  The procedure and its risks of stroke, death, myocardial infarction, emergency surgery, limb ischemia, bleeding, contrast allergy, kidney injury, among others were discussed in detail and accepted by the patient .

## 2012-01-23 NOTE — ED Notes (Signed)
Dr Nicanor Alcon at bedside speaking with wife and daughter at this time

## 2012-01-23 NOTE — ED Notes (Signed)
Pt arrived from home via POV  Drove himself,  Stated he started having chest pain about an hour ago,  When he went down stairs to get something to drink and ran back up steps and he started having chest pain and some sob and diaphoretic,  Denies nausea vomiting, diarrhea or fevers

## 2012-01-23 NOTE — CV Procedure (Addendum)
Diagnostic Cardiac Catheterization and Emergency PCI Report  Lawrence [name]  62 y.o.  male 11-04-1950  Procedure Date: 01/23/2012 Referring Physician: Maggie Font, MD Primary Cardiologist: H.W.B.Fatiha Guzy,III, MD   PROCEDURE:  Left heart catheterization with selective coronary angiography, left ventriculogram, and angioplasty of the distal LAD  INDICATIONS:  Acute coronary syndrome with elevated troponin, chest pain, but no acute EKG changes  The risks, benefits, and details of the procedure were explained to the patient.  The patient verbalized understanding and wanted to proceed.  Informed written consent was obtained.  PROCEDURE TECHNIQUE:  After Xylocaine anesthesia a 6 French sheath was placed in the right radial artery with a single anterior needle wall stick.   Coronary angiography was done using a 6 [place] A2 MP and 6 [place] XB LAD catheter.  Left ventriculography was done using a 6 French A2 MP catheter by hand injection    CONTRAST:  Total of 280 cc.  COMPLICATIONS:  None.    HEMODYNAMICS:  Aortic pressure was 143/94 mmHg; LV pressure was 146/29 mmHg; LVEDP 29 mmHg.  There was no gradient between the left ventricle and aorta.    ANGIOGRAPHIC DATA:   The left main coronary artery is widely patent.  The left anterior descending artery is contains proximal/ostial segmental 50% stenosis. This region is somewhat suggestive of recanalized thrombus. This region is slightly distal to the origin of a large first diagonal. It is non-flow-limiting. In the distal third, the LAD is totally occluded near the apex. Faint left to left collaterals are noted in the apical segment no right to left collaterals are noted..  The left circumflex artery is gives origin to one obtuse marginal that bifurcates on the lateral wall and is free of significant obstruction.  The ramus intermedius branch is free of any significant obstruction  The right coronary artery is dominant. The PDA supplies the  inferoapical segment. No significant obstruction is noted. 2 large left ventricular branches arise from the distal right coronary to.  LEFT VENTRICULOGRAM:  Left ventricular angiogram was done in the 30 RAO projection and revealed apical akinesis with EF of 50%. Poor visualization was obtained due to ventricular ectopy.  PERCUTANEOUS CORONARY INTERVENTION: The distal LAD before the apex was totally occluded. We were able to successfully advance a guidewire into the apical segment and to the inferior portion of the apex. We did overlapping balloon inflations throughout the distal segment aiming to recanalize this region. However, this was unsuccessful. In the cath lab the patient was experiencing no chest discomfort. Given the limited distribution of this vessel further efforts were aborted. I chose not to do aspiration thrombectomy for fear that to aggressive manipulation of the proximal/mid LAD may worsen the appearance of this region.  IMPRESSIONS:  1. Acute coronary syndrome with total occlusion of the apical segment of the distal LAD.  2. Angioplasty was unsuccessful at recanalizing the apical segment of the LAD. Stenting and catheter-based thrombectomy was aborted because of technical and clinical issues including the small vessel caliber, distal location, and moderate proximal LAD disease.  3. Patent ramus, circumflex, and right coronary. Moderate proximal to mid LAD disease approximately 50% stenosed. It is still possible that the proximal disease was the culprit for the ACS and the distal lesion could be embolized thrombus.  4. Apical wall motion abnormality do to LAD occlusion   RECOMMENDATION:  1. Bivalirudin infusion at 0.25 mg per kilogram per hour for 2 hours.  2. Brilinta  3. Beta blocker therapy  4. Statin therapy, glycemic control, and aggressive blood pressure control.  5. Will likely be in the hospital for 72-96 hours do to absence of reperfusion and the remote possibility  of myocardial rupture.   6. Echocardiogram to look for apical thrombus. I'm a.m. We decide to use antithrombotic therapy for 6-8 weeks because of the infarct location to

## 2012-01-23 NOTE — Progress Notes (Signed)
SUBJECTIVE:  Had 6/10 CP this am around 4am and now has 2/10 pain.  His troponin is elevated at 1.52.  EKG with loss of R wave voltage in the lateral precordial leads.  OBJECTIVE:   Vitals:   Filed Vitals:   01/23/12 0715 01/23/12 0730 01/23/12 0745 01/23/12 0800  BP: 156/106  159/101 152/90  Pulse: 94 91 94 106  Temp:      TempSrc:      Resp: 18 21 22 23   Height:      Weight:      SpO2: 99% 99% 98% 97%   I&O's:   Intake/Output Summary (Last 24 hours) at 01/23/12 0835 Last data filed at 01/23/12 0800  Gross per 24 hour  Intake     12 ml  Output    250 ml  Net   -238 ml   TELEMETRY: Reviewed telemetry pt in NSR:     PHYSICAL EXAM General: Well developed, well nourished, in no acute distress Head: Eyes PERRLA, No xanthomas.   Normal cephalic and atramatic  Lungs:   Clear bilaterally to auscultation and percussion. Heart:   HRRR S1 S2 Pulses are 2+ & equal. Abdomen: Bowel sounds are positive, abdomen soft and non-tender without masses Extremities:   No clubbing, cyanosis or edema.  DP +1 Neuro: Alert and oriented X 3. Psych:  Good affect, responds appropriately   LABS: Basic Metabolic Panel:  Basename 01/23/12 0537 01/23/12 0112  NA 137 139  K 4.2 4.0  CL 101 108  CO2 22 --  GLUCOSE 151* 109*  BUN 17 23  CREATININE 0.88 1.20  CALCIUM 8.9 --  MG 2.0 --  PHOS -- --   Liver Function Tests: No results found for this basename: AST:2,ALT:2,ALKPHOS:2,BILITOT:2,PROT:2,ALBUMIN:2 in the last 72 hours No results found for this basename: LIPASE:2,AMYLASE:2 in the last 72 hours CBC:  Basename 01/23/12 0537 01/23/12 0112 01/23/12 0100  WBC 8.1 -- 12.1*  NEUTROABS -- -- 4.0  HGB 14.1 15.0 --  HCT 41.1 44.0 --  MCV 91.5 -- 90.9  PLT 253 -- 256   Cardiac Enzymes:  Basename 01/23/12 0537  CKTOTAL --  CKMB --  CKMBINDEX --  TROPONINI 1.52*   Coag Panel:   Lab Results  Component Value Date   INR 1.00 01/23/2012   INR 0.96 05/14/2009    RADIOLOGY: Dg Chest 2  View  01/04/2012  *RADIOLOGY REPORT*  Clinical Data: Cough and hemoptysis  CHEST - 2 VIEW  Comparison: May 14, 2009  Findings: There is stable elevation of the right hemidiaphragm. Lungs clear.  Heart size and pulmonary vascularity are normal.  No adenopathy.  No bone lesions.  IMPRESSION: No edema or consolidation.  If hemoptysis persists, correlation with chest CT may be advisable.   Original Report Authenticated By: Bretta Bang, M.D.    Ct Angio Chest Pe W/cm &/or Wo Cm  01/23/2012  *RADIOLOGY REPORT*  Clinical Data:  Chest pain  CT ANGIOGRAPHY CHEST, ABDOMEN AND PELVIS  Technique:  Multidetector CT imaging through the chest, abdomen and pelvis was performed using the standard protocol during bolus administration of intravenous contrast.  Multiplanar reconstructed images including MIPs were obtained and reviewed to evaluate the vascular anatomy.  Contrast: OMNIPAQUE IOHEXOL 350 MG/ML SOLN  Comparison:   None.  CTA CHEST  Findings:  Heart is upper limits normal in size.  Aorta is normal caliber.  No dissection.  No filling defects in the pulmonary arteries to suggest pulmonary emboli.  No mediastinal, hilar, or axillary  adenopathy.  Visualized thyroid and chest wall soft tissues unremarkable.  Low lung volumes with bibasilar atelectasis.  No effusions. Coronary artery calcifications in the left coronary artery.   Review of the MIP images confirms the above findings.  IMPRESSION: No evidence of aortic aneurysm or dissection.  Coronary artery calcifications.  CTA ABDOMEN AND PELVIS  Findings:  Aorta is normal caliber.  No dissection.  Mesenteric vessels and renal arteries are widely patent.  Liver, gallbladder, spleen, pancreas, adrenals and left kidney unremarkable.  Small simple appearing cyst in the mid pole of the right kidney.  No hydronephrosis.  Diffuse colonic diverticulosis.  No active diverticulitis. Appendix is visualized and is normal.  Small bowel is decompressed. No free fluid, free air  or adenopathy.   Review of the MIP images confirms the above findings.  IMPRESSION: No evidence of aortic aneurysm or dissection.  Colonic diverticulosis.   Original Report Authenticated By: Charlett Nose, M.D.    Dg Chest Port 1 View  01/23/2012  *RADIOLOGY REPORT*  Clinical Data: Chest pain, shortness of breath.  PORTABLE CHEST - 1 VIEW  Comparison: 01/04/2012  Findings: Very low lung volumes.  Heart is mildly enlarged. Vascular congestion, interstitial prominence and bibasilar atelectasis.  No effusions.  No acute bony abnormality.  IMPRESSION: Very low lung volumes with bibasilar atelectasis.  Mild cardiomegaly and vascular congestion.  Cannot exclude interstitial edema.   Original Report Authenticated By: Charlett Nose, M.D.    Ct Angio Abd/pel W/ And/or W/o  01/23/2012  *RADIOLOGY REPORT*  Clinical Data:  Chest pain  CT ANGIOGRAPHY CHEST, ABDOMEN AND PELVIS  Technique:  Multidetector CT imaging through the chest, abdomen and pelvis was performed using the standard protocol during bolus administration of intravenous contrast.  Multiplanar reconstructed images including MIPs were obtained and reviewed to evaluate the vascular anatomy.  Contrast: OMNIPAQUE IOHEXOL 350 MG/ML SOLN  Comparison:   None.  CTA CHEST  Findings:  Heart is upper limits normal in size.  Aorta is normal caliber.  No dissection.  No filling defects in the pulmonary arteries to suggest pulmonary emboli.  No mediastinal, hilar, or axillary adenopathy.  Visualized thyroid and chest wall soft tissues unremarkable.  Low lung volumes with bibasilar atelectasis.  No effusions. Coronary artery calcifications in the left coronary artery.   Review of the MIP images confirms the above findings.  IMPRESSION: No evidence of aortic aneurysm or dissection.  Coronary artery calcifications.  CTA ABDOMEN AND PELVIS  Findings:  Aorta is normal caliber.  No dissection.  Mesenteric vessels and renal arteries are widely patent.  Liver, gallbladder,  spleen, pancreas, adrenals and left kidney unremarkable.  Small simple appearing cyst in the mid pole of the right kidney.  No hydronephrosis.  Diffuse colonic diverticulosis.  No active diverticulitis. Appendix is visualized and is normal.  Small bowel is decompressed. No free fluid, free air or adenopathy.   Review of the MIP images confirms the above findings.  IMPRESSION: No evidence of aortic aneurysm or dissection.  Colonic diverticulosis.   Original Report Authenticated By: Charlett Nose, M.D.       ASSESSMENT:  1.  NSTEMI - he continues to have 2/10 CP with elevated tropoinin despite Lovenox and IV NTG.  He has not received Lovenox dose this am yet.  Chest CT angio negative for PE, aortic dissection or pericardial effusion.  There is evidence of coronary artery calcifications on chest CT. 2.  HTN 3.  Dyslipidemia 4.  Obesity  PLAN:   1.  Hold  Lovenox for cath 2.  I have discussed case with Dr. Katrinka Blazing.  Given ongoing CP and abnormal troponin I recommend proceeding with heart cath this am.  Cardiac catheterization was discussed with the patient fully including risks on myocardial infarction, death, stroke, bleeding, arrhythmia, dye allergy, renal insufficiency or bleeding.  All patient questions and concerns were discussed and the patient understands and is willing to proceed.   3.  Transfer to [facility] now for cath    Quintella Reichert, MD  01/23/2012  8:35 AM

## 2012-01-23 NOTE — Progress Notes (Signed)
Pt transferred from ED with active CP, SOB and diaphoresis. BP 184/110, HR 90-110's, 02 sats 100% on 2L. Minimal pain relief after titrating nitro drip up to (initially @ upon arrival to floor). Patient describes his current condition as the same as how he felt in the ED. Notified Cardiologist on call, new orders received and carried out. Maralyn Sago, [facility] made aware of situation. Pt to transfer to 1241, Report called to [facility], Charity fundraiser. Wife at bedside.

## 2012-01-23 NOTE — ED Provider Notes (Signed)
History     CSN: [id number]  Arrival date & time 01/22/12  2343   First MD Initiated Contact with Patient 01/23/12 0005      Chief Complaint  Patient presents with  . Chest Pain    (Consider location/radiation/quality/duration/timing/severity/associated sxs/prior treatment) Patient is a 62 y.o. male presenting with chest pain. The history is provided by the patient.  Chest Pain The chest pain began 1 - 2 hours ago. Chest pain occurs constantly. The chest pain is unchanged. The pain is associated with exertion (walking stairs). At its most intense, the pain is at 5/10. The pain is currently at 5/10. The severity of the pain is moderate. The quality of the pain is described as dull. Chest pain is worsened by exertion. Primary symptoms include shortness of breath. Pertinent negatives for primary symptoms include no syncope, no abdominal pain and no nausea.  Associated symptoms include diaphoresis. He tried aspirin for the symptoms. Risk factors include male gender.  Pertinent negatives for past medical history include no MI.  Procedure history is negative for cardiac catheterization.     Past Medical History  Diagnosis Date  . Hypertension   . Bronchitis     History reviewed. No pertinent past surgical history.  History reviewed. No pertinent family history.  History  Substance Use Topics  . Smoking status: Current Some Day Smoker    Types: Cigars  . Smokeless tobacco: Not on file  . Alcohol Use: No      Review of Systems  Constitutional: Positive for diaphoresis.  HENT: Negative for neck pain.   Respiratory: Positive for shortness of breath.   Cardiovascular: Positive for chest pain. Negative for syncope.  Gastrointestinal: Negative for nausea and abdominal pain.  All other systems reviewed and are negative.    Allergies  Review of patient's allergies indicates no known allergies.  Home Medications   Current Outpatient Rx  Name  Route  Sig  Dispense  Refill    . ASPIRIN EC 81 MG PO TBEC   Oral   Take 81 mg by mouth every morning.         Marland Kitchen LISINOPRIL 10 MG PO TABS   Oral   Take 10 mg by mouth daily.           BP 160/103  Pulse 86  Resp 19  Ht 5\' 11"  (1.803 m)  Wt 232 lb (105.235 kg)  BMI 32.36 kg/m2  SpO2 98%  Physical Exam  Constitutional: He is oriented to person, place, and time. He appears well-developed and well-nourished. No distress.  HENT:  Head: Normocephalic and atraumatic.  Mouth/Throat: Oropharynx is clear and moist.  Eyes: Conjunctivae normal are normal. Pupils are equal, round, and reactive to light.  Neck: Normal range of motion. Neck supple. No JVD present.  Cardiovascular: Normal rate, regular rhythm and intact distal pulses.   Pulmonary/Chest: Effort normal and breath sounds normal. No stridor.  Abdominal: Soft. Bowel sounds are normal. There is no tenderness. There is no rebound and no guarding.  Musculoskeletal: Normal range of motion. He exhibits no edema.  Neurological: He is alert and oriented to person, place, and time.  Skin: Skin is warm and dry.  Psychiatric: He has a normal mood and affect.    ED Course  Procedures (including critical care time)   Labs Reviewed  CBC WITH DIFFERENTIAL  PRO B NATRIURETIC PEPTIDE   Dg Chest Port 1 View  01/23/2012  *RADIOLOGY REPORT*  Clinical Data: Chest pain, shortness of breath.  PORTABLE  CHEST - 1 VIEW  Comparison: 01/04/2012  Findings: Very low lung volumes.  Heart is mildly enlarged. Vascular congestion, interstitial prominence and bibasilar atelectasis.  No effusions.  No acute bony abnormality.  IMPRESSION: Very low lung volumes with bibasilar atelectasis.  Mild cardiomegaly and vascular congestion.  Cannot exclude interstitial edema.   Original Report Authenticated By: Charlett Nose, M.D.      No diagnosis found.    MDM   Date: 01/23/2012  Rate: 87  Rhythm: normal sinus rhythm  QRS Axis: normal  Intervals: normal  ST/T Wave abnormalities:  nonspecific ST changes  Conduction Disutrbances:left anterior fascicular block  Narrative Interpretation:   Old EKG Reviewed: none available     2 L O2 via cannula was placed prior to EDP eval.  Patient given 324 mg ASA chewed and 3 SL NTG without change in pain.  100 mcg fentanyl ordered.  Family updated on EKG and informed cardiology was consulted and call was pending.    103 am Cardiology consulted for admission at 103 am.  Will see the patient.    Pain improved post fentanyl, resting comfortably in the room  121 patient and family updated on chemistry results and troponin negative.    MDM Reviewed: nursing note and vitals Interpretation: labs, ECG and x-ray Total time providing critical care: 30-74 minutes. This excludes time spent performing separately reportable procedures and services. Consults: cardiology  CRITICAL CARE Performed by: Jasmine Awe   Total critical care time:  60 minutes Critical care time was exclusive of separately billable procedures and treating other patients.  Critical care was necessary to treat or prevent imminent or life-threatening deterioration.  Critical care was time spent personally by me on the following activities: development of treatment plan with patient and/or surrogate as well as nursing, discussions with consultants, evaluation of patient's response to treatment, examination of patient, obtaining history from patient or surrogate, ordering and performing treatments and interventions, ordering and review of laboratory studies, ordering and review of radiographic studies, pulse oximetry and re-evaluation of patient's condition.    Jasmine Awe, MD 01/23/12 [phone]

## 2012-01-23 NOTE — ED Notes (Signed)
Dr Nicanor Alcon bedside to speak with family

## 2012-01-24 DIAGNOSIS — E785 Hyperlipidemia, unspecified: Secondary | ICD-10-CM

## 2012-01-24 DIAGNOSIS — I253 Aneurysm of heart: Secondary | ICD-10-CM | POA: Diagnosis present

## 2012-01-24 HISTORY — DX: Hyperlipidemia, unspecified: E78.5

## 2012-01-24 LAB — BASIC METABOLIC PANEL
BUN: 9 mg/dL (ref 6–23)
CO2: 26 mEq/L (ref 19–32)
Calcium: 9.2 mg/dL (ref 8.4–10.5)
Chloride: 100 mEq/L (ref 96–112)
Creatinine, Ser: 0.83 mg/dL (ref 0.50–1.35)
GFR calc Af Amer: >90 mL/min (ref >90)
GFR calc non Af Amer: >90 mL/min (ref >90)
Glucose, Bld: 102 mg/dL — ABNORMAL HIGH (ref 70–99)
Potassium: 4 mEq/L (ref 3.5–5.1)
Sodium: 136 mEq/L (ref 135–145)

## 2012-01-24 LAB — HEMOGLOBIN A1C
Hgb A1c MFr Bld: 5.6 % (ref <5.7)
Mean Plasma Glucose: 114 mg/dL (ref <117)

## 2012-01-24 LAB — CBC
HCT: 44.1 % (ref 39.0–52.0)
Hemoglobin: 14.8 g/dL (ref 13.0–17.0)
MCH: 30.8 pg (ref 26.0–34.0)
MCHC: 33.6 g/dL (ref 30.0–36.0)
MCV: 91.7 fL (ref 78.0–100.0)
Platelets: 210 10*3/uL (ref 150–400)
RBC: 4.81 MIL/uL (ref 4.22–5.81)
RDW: 13.2 % (ref 11.5–15.5)
WBC: 7.4 10*3/uL (ref 4.0–10.5)

## 2012-01-24 LAB — POCT ACTIVATED CLOTTING TIME: Activated Clotting Time: 470 seconds

## 2012-01-24 LAB — PATHOLOGIST SMEAR REVIEW

## 2012-01-24 LAB — TROPONIN I: Troponin I: >20 ng/mL (ref <0.30)

## 2012-01-24 MED ORDER — METOPROLOL TARTRATE 50 MG PO TABS
50.0000 mg | ORAL_TABLET | Freq: Two times a day (BID) | ORAL | Status: DC
  Administered 2012-01-24 – 2012-01-26 (×6): 50 mg via ORAL
  Filled 2012-01-24 (×9): qty 1

## 2012-01-24 MED ORDER — LISINOPRIL 10 MG PO TABS
10.0000 mg | ORAL_TABLET | Freq: Two times a day (BID) | ORAL | Status: DC
  Administered 2012-01-24 – 2012-01-26 (×4): 10 mg via ORAL
  Filled 2012-01-24 (×7): qty 1

## 2012-01-24 MED ORDER — ENOXAPARIN SODIUM 80 MG/0.8ML ~~LOC~~ SOLN
70.0000 mg | SUBCUTANEOUS | Status: AC
  Administered 2012-01-24: 70 mg via SUBCUTANEOUS
  Filled 2012-01-24: qty 0.8

## 2012-01-24 MED ORDER — WARFARIN SODIUM 7.5 MG PO TABS
7.5000 mg | ORAL_TABLET | Freq: Once | ORAL | Status: AC
  Administered 2012-01-24: 7.5 mg via ORAL
  Filled 2012-01-24: qty 1

## 2012-01-24 MED ORDER — ENOXAPARIN SODIUM 120 MG/0.8ML ~~LOC~~ SOLN
110.0000 mg | Freq: Two times a day (BID) | SUBCUTANEOUS | Status: DC
  Administered 2012-01-24 – 2012-01-25 (×3): 110 mg via SUBCUTANEOUS
  Filled 2012-01-24 (×5): qty 0.8

## 2012-01-24 MED ORDER — WARFARIN - PHARMACIST DOSING INPATIENT
Freq: Every day | Status: DC
  Administered 2012-01-26: 17:00:00

## 2012-01-24 MED ORDER — LORAZEPAM 0.5 MG PO TABS
0.5000 mg | ORAL_TABLET | Freq: Four times a day (QID) | ORAL | Status: DC | PRN
  Administered 2012-01-24: 0.5 mg via ORAL
  Filled 2012-01-24: qty 1

## 2012-01-24 MED ORDER — HYDROCHLOROTHIAZIDE 12.5 MG PO CAPS
12.5000 mg | ORAL_CAPSULE | Freq: Every day | ORAL | Status: DC
  Administered 2012-01-25 – 2012-01-27 (×3): 12.5 mg via ORAL
  Filled 2012-01-24 (×3): qty 1

## 2012-01-24 MED ORDER — CLOPIDOGREL BISULFATE 75 MG PO TABS
75.0000 mg | ORAL_TABLET | Freq: Every day | ORAL | Status: DC
  Administered 2012-01-25 – 2012-01-27 (×3): 75 mg via ORAL
  Filled 2012-01-24 (×4): qty 1

## 2012-01-24 MED FILL — Dextrose Inj 5%: INTRAVENOUS | Qty: 50 | Status: AC

## 2012-01-24 NOTE — Progress Notes (Signed)
ANTICOAGULATION CONSULT NOTE - Initial Consult  Pharmacy Consult for warfarin + lovenox Indication: ACS s/p unsuccessful angioplasty, apical infarct w/ risk of thrombus  No Known Allergies  Patient Measurements: Height: 5\' 11"  (180.3 cm) Weight: 235 lb 3.7 oz (106.7 kg) IBW/kg (Calculated) : 75.3   Vital Signs: Temp: 98.2 F (36.8 C) (01/13 0746) Temp src: Oral (01/13 0746) BP: 136/88 mmHg (01/13 0800) Pulse Rate: 89  (01/13 0800)  Labs:  Basename 01/24/12 0545 01/23/12 2319 01/23/12 1725 01/23/12 1159 01/23/12 0537 01/23/12 0112 01/23/12 0100  HGB 14.8 -- -- -- 14.1 -- --  HCT 44.1 -- -- -- 41.1 44.0 --  PLT 210 -- -- -- 253 -- 256  APTT -- -- -- -- 29 -- --  LABPROT -- -- -- -- 13.1 -- --  INR -- -- -- -- 1.00 -- --  HEPARINUNFRC -- -- -- -- -- -- --  CREATININE 0.83 -- -- -- 0.88 1.20 --  CKTOTAL -- -- -- -- -- -- --  CKMB -- -- -- -- -- -- --  TROPONINI -- >20.00* >20.00* 8.02* -- -- --    Estimated Creatinine Clearance: 119.1 ml/min (by C-G formula based on Cr of 0.83).   Medical History: Past Medical History  Diagnosis Date  . Hypertension   . Bronchitis     Medications:  Prescriptions prior to admission  Medication Sig Dispense Refill  . aspirin EC 81 MG tablet Take 81 mg by mouth every morning.      Marland Kitchen lisinopril (PRINIVIL,ZESTRIL) 10 MG tablet Take 10 mg by mouth daily.        Assessment: 60 yom presented to the hospital with CP. Initially started on full dose lovenox but transitioned to prophylactic dosing s/p PCI w/ unsuccessful angioplasty. Now to restart full dose lovenox with coumadin due to apical infarct and risk of thrombus. Of note, pt did receive lovenox 40mg  this AM. INR and CBC are WNL.   Goal of Therapy:  INR 2-3 Anti-Xa level 0.6-1.2 units/ml 4hrs after LMWH dose given Monitor platelets by anticoagulation protocol: Yes   Plan:  1. Lovenox 70mg  SQ x 1 now to make a total of 110mg  dose this AM 2. Lovenxo 110mg  SQ Q12H starting  tonight 3. Warfarin 7.5mg  PO x  1 tonight 4. CBC Q72H while on lovenox 5. Daily INR 6. F/u echo for r/o thrombus  Stormie Ventola, Drake Leach 01/24/2012,9:25 AM

## 2012-01-24 NOTE — Progress Notes (Signed)
Chaplain visited patient after receiving a referral from the On-call Chaplain. Patient was awake, alert and responsive. Patient expressed great improvement in his heart condition and appear to be very optimistic about life. Patient has a strong family ties and support. Chaplain provided ministry of presence and empathic listing. Chaplain shared words of hope and encouragement and will follow-up as needed. Patient expressed his appreciation for Chaplain's visit and presence.

## 2012-01-24 NOTE — Progress Notes (Signed)
CARDIAC REHAB PHASE I   PRE:  Rate/Rhythm: 78SR  BP:  Supine:   Sitting: 140/99  Standing:    SaO2: 98%RA  MODE:  Ambulation: 300 ft   POST:  Rate/Rhythem: 92SR  BP:  Supine:   Sitting: 141/99  Standing:    SaO2: 98%RA 1345-1500 Pt walked 300 ft with steady gait. Tolerated well. Stated he had walked twice already. Encouraged pt to rest 3-4 hours between walks and not to do more than one round which is  300 ft. Chart states to walk gingerly. Education completed except exercise with pt and wife. Questions answered. Discussed smoking cessation since pt smokes cigars. Handouts given. Permission given to refer to [facility] Phase 2. Discussed risk factors and importance of BP control and better lipid control.   Duanne Limerick

## 2012-01-24 NOTE — Progress Notes (Addendum)
Patient Name: Lawrence [name] Date of Encounter: 01/24/2012    SUBJECTIVE: Mr. Kamphaus has had no chest discomfort since he left [facility] yesterday in route to the Cath Lab. He had a quiet night but did not sleep. There is no discomfort with breathing. He denies dyspnea. There is no radiation of discomfort. He has no neck or back pain.  TELEMETRY:  Normal sinus rhythm: Filed Vitals:   01/24/12 0600 01/24/12 0700 01/24/12 0746 01/24/12 0800  BP:    136/88  Pulse: 90 80  89  Temp:   98.2 F (36.8 C)   TempSrc:   Oral   Resp: 21 17  14   Height:      Weight:      SpO2: 98% 96%  97%    Intake/Output Summary (Last 24 hours) at 01/24/12 0842 Last data filed at 01/24/12 0700  Gross per 24 hour  Intake 1174.95 ml  Output   2900 ml  Net -1725.05 ml    LABS: Basic Metabolic Panel:  Basename 01/24/12 0545 01/23/12 0537  NA 136 137  K 4.0 4.2  CL 100 101  CO2 26 22  GLUCOSE 102* 151*  BUN 9 17  CREATININE 0.83 0.88  CALCIUM 9.2 8.9  MG -- 2.0  PHOS -- --   CBC:  Basename 01/24/12 0545 01/23/12 0537 01/23/12 0100  WBC 7.4 8.1 --  NEUTROABS -- -- 4.0  HGB 14.8 14.1 --  HCT 44.1 41.1 --  MCV 91.7 91.5 --  PLT 210 253 --   Cardiac Enzymes:  Basename 01/23/12 2319 01/23/12 1725 01/23/12 1159  CKTOTAL -- -- --  CKMB -- -- --  CKMBINDEX -- -- --  TROPONINI >20.00* >20.00* 8.02*   Hemoglobin A1C:  Basename 01/23/12 0537  HGBA1C 5.9*   Fasting Lipid Panel:  Basename 01/23/12 0537  CHOL 252*  HDL 41  LDLCALC 167*  TRIG 220*  CHOLHDL 6.1  LDLDIRECT --   BNP    Component Value Date/Time   PROBNP 128.6* 01/23/2012 1159   ECG: Loss of R-wave forces in V1 through V6 and inferiorly, Q waves are present  Radiology/Studies:  No new findings  Physical Exam: Blood pressure 136/88, pulse 89, temperature 98.2 F (36.8 C), temperature source Oral, resp. rate 14, height 5\' 11"  (1.803 m), weight 106.7 kg (235 lb 3.7 oz), SpO2 97.00%. Weight change: 1.465 kg (3 lb 3.7  oz)   No JVD is noted on neck exam.  The chest is clear to auscultation and percussion.  A soft S4 gallop is audible. No murmur or rub is heard.  Right radial cath site is unremarkable.  No neurological abnormalities are noted. He is awake, coherent, and has no focal deficits.  ASSESSMENT:  1. Apical infarction due to either spontaneous occlusion or embolus to the distal LAD. Mechanical revascularization did not work despite angioplasty throughout the region of occlusion. Flow could not be reestablished.  2. Proximal LAD disease in the 50% range with the appearance of recanalized thrombus.  3. Apical akinesis by ventriculogram places the patient at increased risk for development of an apical aneurysm and thrombus.  4. Uncontrolled risk factors including hypertension and hyperlipidemia. LDL was over 160 on presentation.    Plan:  1. Aggressive statin and blood pressure therapy  2. Heart healthy diet  3. Phase I cardiac rehabilitation  4. Begin ambulation, gingerly, as the patient did have an infarct without reperfusion  5. Watch for evidence of apical thrombus development or mechanical complications over the  next 96 hours.  Selinda Eon 01/24/2012, 8:42 AM

## 2012-01-25 LAB — BASIC METABOLIC PANEL
BUN: 14 mg/dL (ref 6–23)
CO2: 26 mEq/L (ref 19–32)
Calcium: 9.3 mg/dL (ref 8.4–10.5)
Chloride: 100 mEq/L (ref 96–112)
Creatinine, Ser: 1.19 mg/dL (ref 0.50–1.35)
GFR calc Af Amer: 76 mL/min — ABNORMAL LOW (ref >90)
GFR calc non Af Amer: 65 mL/min — ABNORMAL LOW (ref >90)
Glucose, Bld: 96 mg/dL (ref 70–99)
Potassium: 4.4 mEq/L (ref 3.5–5.1)
Sodium: 136 mEq/L (ref 135–145)

## 2012-01-25 LAB — CBC
HCT: 41.9 % (ref 39.0–52.0)
Hemoglobin: 13.8 g/dL (ref 13.0–17.0)
MCH: 30.1 pg (ref 26.0–34.0)
MCHC: 32.9 g/dL (ref 30.0–36.0)
MCV: 91.5 fL (ref 78.0–100.0)
Platelets: 214 10*3/uL (ref 150–400)
RBC: 4.58 MIL/uL (ref 4.22–5.81)
RDW: 13.1 % (ref 11.5–15.5)
WBC: 7.2 10*3/uL (ref 4.0–10.5)

## 2012-01-25 LAB — PROTIME-INR
INR: 1.04 (ref 0.00–1.49)
Prothrombin Time: 13.5 seconds (ref 11.6–15.2)

## 2012-01-25 MED ORDER — WARFARIN SODIUM 7.5 MG PO TABS
7.5000 mg | ORAL_TABLET | Freq: Once | ORAL | Status: AC
  Administered 2012-01-25: 7.5 mg via ORAL
  Filled 2012-01-25: qty 1

## 2012-01-25 NOTE — Progress Notes (Signed)
Patient Name: Lawrence [name] Date of Encounter: 01/25/2012    SUBJECTIVE: Lawrence [name] says that he feels great. He denies chest pain. He denies shortness of breath. There no medication side effects.  TELEMETRY:  Sinus tachycardia: Filed Vitals:   01/25/12 0330 01/25/12 0500 01/25/12 0722 01/25/12 0800  BP: 117/79   115/68  Pulse:      Temp: 97.6 F (36.4 C)  97.9 F (36.6 C)   TempSrc: Oral  Axillary   Resp: 18   26  Height:      Weight:  105.1 kg (231 lb 11.3 oz)    SpO2: 98%   97%    Intake/Output Summary (Last 24 hours) at 01/25/12 0833 Last data filed at 01/24/12 1915  Gross per 24 hour  Intake    660 ml  Output    500 ml  Net    160 ml    LABS: Basic Metabolic Panel:  Basename 01/25/12 0330 01/24/12 0545 01/23/12 0537  NA 136 136 --  K 4.4 4.0 --  CL 100 100 --  CO2 26 26 --  GLUCOSE 96 102* --  BUN 14 9 --  CREATININE 1.19 0.83 --  CALCIUM 9.3 9.2 --  MG -- -- 2.0  PHOS -- -- --   CBC:  Basename 01/25/12 0330 01/24/12 0545 01/23/12 0100  WBC 7.2 7.4 --  NEUTROABS -- -- 4.0  HGB 13.8 14.8 --  HCT 41.9 44.1 --  MCV 91.5 91.7 --  PLT 214 210 --   Cardiac Enzymes:  Basename 01/23/12 2319 01/23/12 1725 01/23/12 1159  CKTOTAL -- -- --  CKMB -- -- --  CKMBINDEX -- -- --  TROPONINI >20.00* >20.00* 8.02*  Hemoglobin A1C:  Basename 01/24/12 0545  HGBA1C 5.6   Fasting Lipid Panel:  Basename 01/23/12 0537  CHOL 252*  HDL 41  LDLCALC 167*  TRIG 220*  CHOLHDL 6.1  LDLDIRECT --    Radiology/Studies:  No new  Physical Exam: Blood pressure 115/68, pulse 93, temperature 97.9 F (36.6 C), temperature source Axillary, resp. rate 26, height 5\' 11"  (1.803 m), weight 105.1 kg (231 lb 11.3 oz), SpO2 97.00%. Weight change: -1.6 kg (-3 lb 8.4 oz)   S4 gallop. No rub or murmur.  No jugular vein distention is noted.  No peripheral edema.  Lungs are clear auscultation and percussion.  ASSESSMENT:  1. Apical myocardial infarction possibly related to  distal vessel embolization from proximal LAD.  2. Apical akinesis/dyskinesis demonstrated by ventriculography place and the patient at risk for apical thrombus  3. Severe hyperlipidemia  4. Hypertension  Plan:  1. Optimize antihypertensive/heart failure therapy  2. Aspirin/Plavix/Coumadin  3. Phase I into cardiac rehabilitation  4. Target discharge date Thursday morning. Echocardiogram will be performed tomorrow morning to rule out apical thrombus and assess residual LV function.  Selinda Eon 01/25/2012, 8:33 AM

## 2012-01-25 NOTE — Progress Notes (Signed)
Ambulated pt to transfer to 2037, pt tolerated, VSS stable, NT at bedside, pt placed on 2000's telemetry, confirmed with NS, RN aware of pt's arrival, spouse notified of transfer

## 2012-01-25 NOTE — Progress Notes (Signed)
Utilization Review Completed.  01/25/2012  

## 2012-01-25 NOTE — Progress Notes (Signed)
CARDIAC REHAB PHASE I   PRE:  Rate/Rhythm: 74SR  BP:  Supine:   Sitting: 119/75  Standing:    SaO2: 95%RA  MODE:  Ambulation: 700 ft   POST:  Rate/Rhythem: 73  BP:  Supine:   Sitting: 134/83  Standing:    SaO2: 98%RA 1125-1158 Pt walked 700 ft on RA with steady gait. Tolerated well. Denied CP. Completed exercise ed. Pt to recliner after walk. Encouraged pt to watch Coumadin video later.  Encouraged pt to weigh daily at home and notified cardiologist if weight gain of 3 lbs overnight or 5 lbs in week.  Lawrence [name]

## 2012-01-25 NOTE — Progress Notes (Signed)
ANTICOAGULATION CONSULT NOTE - Initial Consult  Pharmacy Consult for warfarin Indication: ACS s/p unsuccessful angioplasty, apical infarct w/ risk of thrombus  No Known Allergies  Patient Measurements: Height: 5\' 11"  (180.3 cm) Weight: 231 lb 11.3 oz (105.1 kg) IBW/kg (Calculated) : 75.3   Vital Signs: Temp: 97.9 F (36.6 C) (01/14 0722) Temp src: Axillary (01/14 0722) BP: 115/68 mmHg (01/14 0800) Pulse Rate: 93  (01/13 2210)  Labs:  Basename 01/25/12 0330 01/24/12 0545 01/23/12 2319 01/23/12 1725 01/23/12 1159 01/23/12 0537  HGB 13.8 14.8 -- -- -- --  HCT 41.9 44.1 -- -- -- 41.1  PLT 214 210 -- -- -- 253  APTT -- -- -- -- -- 29  LABPROT 13.5 -- -- -- -- 13.1  INR 1.04 -- -- -- -- 1.00  HEPARINUNFRC -- -- -- -- -- --  CREATININE 1.19 0.83 -- -- -- 0.88  CKTOTAL -- -- -- -- -- --  CKMB -- -- -- -- -- --  TROPONINI -- -- >20.00* >20.00* 8.02* --    Estimated Creatinine Clearance: 82.4 ml/min (by C-G formula based on Cr of 1.19).  Assessment: 21 yom presented to the hospital with CP. Initially started on full dose lovenox but transitioned to prophylactic dosing s/p PCI w/ unsuccessful angioplasty. Now restarted on full dose lovenox with coumadin due to apical infarct and risk of thrombus. Today INR remains subtherapeutic as anticipated. Pt continues on plavix and aspirin as well.   Goal of Therapy:  INR 2-3 Anti-Xa level 0.6-1.2 units/ml 4hrs after LMWH dose given Monitor platelets by anticoagulation protocol: Yes   Plan:  1. Continue lovenox 110mg  SQ Q12H - may need to reduce dose if weight continues to decrease 2. Repat Warfarin 7.5mg  PO x  1 tonight 3. CBC Q72H while on lovenox 4. Daily INR 5. F/u echo for r/o thrombus 6. Need coumadin + lovenox + aspirin + plavix together?? - can increase the risk of bleeding  Markee Remlinger, Drake Leach 01/25/2012,8:27 AM

## 2012-01-26 DIAGNOSIS — Z7901 Long term (current) use of anticoagulants: Secondary | ICD-10-CM

## 2012-01-26 DIAGNOSIS — G4733 Obstructive sleep apnea (adult) (pediatric): Secondary | ICD-10-CM

## 2012-01-26 HISTORY — DX: Obstructive sleep apnea (adult) (pediatric): G47.33

## 2012-01-26 LAB — PROTIME-INR
INR: 1.03 (ref 0.00–1.49)
Prothrombin Time: 13.4 seconds (ref 11.6–15.2)

## 2012-01-26 LAB — BASIC METABOLIC PANEL
BUN: 17 mg/dL (ref 6–23)
CO2: 27 mEq/L (ref 19–32)
Calcium: 9.3 mg/dL (ref 8.4–10.5)
Chloride: 99 mEq/L (ref 96–112)
Creatinine, Ser: 1.16 mg/dL (ref 0.50–1.35)
GFR calc Af Amer: 78 mL/min — ABNORMAL LOW (ref >90)
GFR calc non Af Amer: 67 mL/min — ABNORMAL LOW (ref >90)
Glucose, Bld: 104 mg/dL — ABNORMAL HIGH (ref 70–99)
Potassium: 4.2 mEq/L (ref 3.5–5.1)
Sodium: 136 mEq/L (ref 135–145)

## 2012-01-26 MED ORDER — ENOXAPARIN SODIUM 120 MG/0.8ML ~~LOC~~ SOLN
105.0000 mg | Freq: Two times a day (BID) | SUBCUTANEOUS | Status: DC
  Administered 2012-01-26 (×2): 105 mg via SUBCUTANEOUS
  Filled 2012-01-26 (×4): qty 0.8

## 2012-01-26 MED ORDER — LISINOPRIL 10 MG PO TABS
10.0000 mg | ORAL_TABLET | Freq: Two times a day (BID) | ORAL | Status: AC
  Administered 2012-01-26: 10 mg via ORAL
  Filled 2012-01-26: qty 1

## 2012-01-26 MED ORDER — WARFARIN SODIUM 2.5 MG PO TABS
12.5000 mg | ORAL_TABLET | Freq: Once | ORAL | Status: AC
  Administered 2012-01-26: 12.5 mg via ORAL
  Filled 2012-01-26: qty 1

## 2012-01-26 MED ORDER — WARFARIN VIDEO: Freq: Once | Status: DC

## 2012-01-26 MED ORDER — COUMADIN BOOK
Freq: Once | Status: AC
  Administered 2012-01-26: 12:00:00
  Filled 2012-01-26: qty 1

## 2012-01-26 MED ORDER — LISINOPRIL 20 MG PO TABS
20.0000 mg | ORAL_TABLET | Freq: Every day | ORAL | Status: DC
  Administered 2012-01-27: 20 mg via ORAL
  Filled 2012-01-26: qty 1

## 2012-01-26 MED ORDER — WARFARIN SODIUM 10 MG PO TABS
10.0000 mg | ORAL_TABLET | Freq: Once | ORAL | Status: DC
  Filled 2012-01-26: qty 1

## 2012-01-26 NOTE — Progress Notes (Signed)
1440  Cardiac Rehab Pt can ambulate independently. Checked on pt, states that he has not walked yet today, but will later. Encouraged pt to notify nurse of any problems that may arise, and where he should ambulate. Also, encouraged at least 2 walks today.

## 2012-01-26 NOTE — Progress Notes (Signed)
Patient Name: [name] Date of Encounter: 01/26/2012    SUBJECTIVE:: Lawrence [name] feels that he is been held back by restrictions on physical activity. He says he feels great. There is no dyspnea or chest pain. He denies orthopnea. No chills or fever. No pleuritic discomfort.  TELEMETRY:  Normal sinus rhythm heart rates in the 80 range: Filed Vitals:   01/25/12 1751 01/25/12 2008 01/25/12 2150 01/26/12 0316  BP: 126/87 123/86 126/81 117/83  Pulse:  79 81 72  Temp:  98.1 F (36.7 C)  98.4 F (36.9 C)  TempSrc:  Oral  Oral  Resp:  20  20  Height:      Weight:      SpO2:  98%  97%    Intake/Output Summary (Last 24 hours) at 01/26/12 0834 Last data filed at 01/25/12 1700  Gross per 24 hour  Intake    480 ml  Output      0 ml  Net    480 ml    LABS: Basic Metabolic Panel:  Basename 01/26/12 0504 01/25/12 0330  NA 136 136  K 4.2 4.4  CL 99 100  CO2 27 26  GLUCOSE 104* 96  BUN 17 14  CREATININE 1.16 1.19  CALCIUM 9.3 9.3  MG -- --  PHOS -- --   CBC:  Basename 01/25/12 0330 01/24/12 0545  WBC 7.2 7.4  NEUTROABS -- --  HGB 13.8 14.8  HCT 41.9 44.1  MCV 91.5 91.7  PLT 214 210   Cardiac Enzymes:  Basename 01/23/12 2319 01/23/12 1725 01/23/12 1159  CKTOTAL -- -- --  CKMB -- -- --  CKMBINDEX -- -- --  TROPONINI >20.00* >20.00* 8.02*   BNP: No components found with this basename: POCBNP:3 Hemoglobin A1C:  Basename 01/24/12 0545  HGBA1C 5.6   BNP    Component Value Date/Time   PROBNP 128.6* 01/23/2012 1159    Physical Exam: Blood pressure 117/83, pulse 72, temperature 98.4 F (36.9 C), temperature source Oral, resp. rate 20, height 5\' 11"  (1.803 m), weight 105.1 kg (231 lb 11.3 oz), SpO2 97.00%. Weight change:    An S4 gallop is present.No rub is heard. No murmur is heard.  The neck veins are not distended.  Lungs are clear.  The extremities reveal no edema.  ASSESSMENT:  1. Anteroapical myocardial infarction possibly due to proximal to  distal vessel emboli. Residual less than 50% proximal LAD.  2. No clinical evidence of decompensated heart failure. We have the very least have acute diastolic heart failure, compensated.  3. Hypertension under better control  4. Hyperlipidemia being aggressively treated  5. Sleep apnea, obstructive  Plan:  1. 2-D Doppler echocardiogram today to assess residual LV function and to look for evidence of apical thrombi.  2. Progressive ambulation  3. Anticipated discharge in a.m.  4. Subtherapeutic INR despite 3 days of Coumadin. We'll go ahead and give a more aggressive dose today . No plan to bridge unless we see thrombus. Selinda Eon 01/26/2012, 8:34 AM

## 2012-01-26 NOTE — Progress Notes (Signed)
*  PRELIMINARY RESULTS* Echocardiogram 2D Echocardiogram has been performed.  Jeryl Columbia 01/26/2012, 9:39 AM

## 2012-01-26 NOTE — Progress Notes (Signed)
ANTICOAGULATION CONSULT NOTE - Follow Up Consult  Pharmacy Consult for Lovenox/Coumadin Indication: Thrombotic risk  No Known Allergies  Patient Measurements: Height: 5\' 11"  (180.3 cm) Weight: 231 lb 11.3 oz (105.1 kg) IBW/kg (Calculated) : 75.3  Heparin Dosing Weight:    Vital Signs: Temp: 98.4 F (36.9 C) (01/15 0316) Temp src: Oral (01/15 0316) BP: 117/83 mmHg (01/15 0316) Pulse Rate: 72  (01/15 0316)  Labs:  Basename 01/26/12 0504 01/25/12 0330 01/24/12 0545 01/23/12 2319 01/23/12 1725 01/23/12 1159  HGB -- 13.8 14.8 -- -- --  HCT -- 41.9 44.1 -- -- --  PLT -- 214 210 -- -- --  APTT -- -- -- -- -- --  LABPROT 13.4 13.5 -- -- -- --  INR 1.03 1.04 -- -- -- --  HEPARINUNFRC -- -- -- -- -- --  CREATININE 1.16 1.19 0.83 -- -- --  CKTOTAL -- -- -- -- -- --  CKMB -- -- -- -- -- --  TROPONINI -- -- -- >20.00* >20.00* 8.02*    Estimated Creatinine Clearance: 84.6 ml/min (by C-G formula based on Cr of 1.16).  Assessment: Chest pain  PMH: HTN, bronchitis  AC: Lovenox/Coumadin for ACS, apical infarction (un-stentable) w/ risk of thrombus - INR 1.03, H/H 13.8/41.9, plts 214.  ID: Afebrile, WBC WNL, no abx  CV: ACS s/p unsuccessful angioplasty, apical infarct w/ risk of thrombus Hx HTN - BP 117/83, HR 72, troponins >20, TC 252, Trig 220, LDL 167 - s/p cath Meds: ASA 81mg , lipitor, plavix, HCTZ, lisinopril, metoprolol   Endo: Serum CBGs ok, no meds - A1c 5.6, TSH WNL  GI/Nutr: Cardiac diet  Renal: Scr 1.16  Pulm: Hx bronchitis - 97% RA - CPAP overnight  Heme/Onc: H/H 13.8/41.9 plts 214  Goal of Therapy:  Anti-Xa level 0.6-1.2 units/ml 4hrs after LMWH dose given Monitor platelets by anticoagulation protocol: Yes   Plan:  1. Change Lovenox to 105mg  sq q12 hrs 2. CBC Q72H while on LMWH 3. Coumadin 12.5mg  po x 1 today ordered by Dr. Evelina Bucy. Merilynn Finland, PharmD, BCPS Clinical Staff Pharmacist Pager [phone]  Misty Stanley  Stillinger 01/26/2012,10:26 AM

## 2012-01-27 LAB — CBC
HCT: 46.9 % (ref 39.0–52.0)
Hemoglobin: 16.1 g/dL (ref 13.0–17.0)
MCH: 31.6 pg (ref 26.0–34.0)
MCHC: 34.3 g/dL (ref 30.0–36.0)
MCV: 92.1 fL (ref 78.0–100.0)
Platelets: 266 10*3/uL (ref 150–400)
RBC: 5.09 MIL/uL (ref 4.22–5.81)
RDW: 13 % (ref 11.5–15.5)
WBC: 8.6 10*3/uL (ref 4.0–10.5)

## 2012-01-27 LAB — PROTIME-INR
INR: 1.03 (ref 0.00–1.49)
Prothrombin Time: 13.4 seconds (ref 11.6–15.2)

## 2012-01-27 MED ORDER — RIVAROXABAN 20 MG PO TABS: 20.0000 mg | ORAL_TABLET | Freq: Every day | ORAL | Status: DC

## 2012-01-27 MED ORDER — NITROGLYCERIN 0.4 MG SL SUBL: 0.4000 mg | SUBLINGUAL_TABLET | SUBLINGUAL | Status: DC | PRN

## 2012-01-27 MED ORDER — LISINOPRIL 20 MG PO TABS: 20.0000 mg | ORAL_TABLET | Freq: Every day | ORAL | Status: DC

## 2012-01-27 MED ORDER — HYDROCHLOROTHIAZIDE 12.5 MG PO CAPS
12.5000 mg | ORAL_CAPSULE | Freq: Every day | ORAL | Status: DC
Start: 2012-01-27 — End: 2013-01-31

## 2012-01-27 MED ORDER — CLOPIDOGREL BISULFATE 75 MG PO TABS: 75.0000 mg | ORAL_TABLET | Freq: Every day | ORAL | Status: DC

## 2012-01-27 MED ORDER — METOPROLOL SUCCINATE ER 100 MG PO TB24: ORAL_TABLET | ORAL | Status: DC

## 2012-01-27 MED ORDER — ATORVASTATIN CALCIUM 80 MG PO TABS: 40.0000 mg | ORAL_TABLET | Freq: Every day | ORAL | Status: DC

## 2012-01-27 MED ORDER — METOPROLOL SUCCINATE ER 100 MG PO TB24
100.0000 mg | ORAL_TABLET | Freq: Every day | ORAL | Status: DC
  Administered 2012-01-27: 100 mg via ORAL
  Filled 2012-01-27: qty 1

## 2012-01-27 MED ORDER — RIVAROXABAN 20 MG PO TABS
20.0000 mg | ORAL_TABLET | Freq: Every day | ORAL | Status: DC
  Administered 2012-01-27: 20 mg via ORAL
  Filled 2012-01-27: qty 1

## 2012-01-27 NOTE — Care Management Note (Signed)
    Page 1 of 1   01/27/2012     2:28:35 PM   CARE MANAGEMENT NOTE 01/27/2012  Patient:  Lawrence [name], Lawrence [name]   Account Number:  [id number]  Date Initiated:  01/27/2012  Documentation initiated by:  Viola Kinnick  Subjective/Objective Assessment:   PT ADM WITH NSTEMI ON 01/22/12.  PTA, PT INDEPENDENT, LIVES WITH WIFE.     Action/Plan:   MET WITH PT AND WIFE.  XARELTO COPAY CARD GIVEN WITH EXPLANATION OF CARD BENEFITS AND ACTIVATION.   Anticipated DC Date:  01/27/2012   Anticipated DC Plan:  HOME/SELF CARE      DC Planning Services  CM consult      Choice offered to / List presented to:             Status of service:  Completed, signed off Medicare Important Message given?   (If response is "NO", the following Medicare IM given date fields will be blank) Date Medicare IM given:   Date Additional Medicare IM given:    Discharge Disposition:  HOME/SELF CARE  Per UR Regulation:    If discussed at Long Length of Stay Meetings, dates discussed:    Comments:

## 2012-01-27 NOTE — Discharge Summary (Signed)
Patient ID: Lawrence [name] MRN: [id number] DOB/AGE: Sep 06, 1950 62 y.o.  Admit date: 01/22/2012 Discharge date: 01/27/2012  Patient Active Problem List  Diagnosis  . NSTEMI (non-ST elevated myocardial infarction)  . Chest pain  . HTN (hypertension)  . Left ventricular aneurysm  . Hyperlipidemia LDL goal < 70  . Obstructive sleep apnea  . Long term (current) use of anticoagulants   Primary Discharge Diagnosis: Non-ST elevation myocardial infarction Secondary Discharge Diagnosis: 1. Coronary atherosclerotic heart disease  2. Severe poorly controlled hypertension with left ventricular hypertrophy  3. Hyperlipidemia, poorly controlled  4. Obstructive sleep apnea  Significant Diagnostic Studies: 1. Coronary angiography with PTCA of the distal LAD  2. 2-D echocardiogram  Consults: None  Hospital Course:  [name] presented with severe substernal chest pain. No acute EKG changes were noted. Several hours later his troponin was elevated. Because of the elevated troponin and ongoing sense of very mild chest discomfort he underwent coronary angiography. This was approximately 12 hours after presentation and it demonstrated a segmental 50% proximal LAD stenosis which had the appearance of recanalized thrombus and total occlusion of the distal third of the LAD. Angioplasty was performed in the distal portion of the LAD but without sufficient improvement of flow to suggest excess. The response of this area to angioplasty suggestive embolic occlusion.  The patient has been essentially pain-free since the PCI procedure. He is ablated without difficulty. He did develop an S4 gallop on clinical exam. There is been no dyspnea. Medications have been adjusted. Echocardiography on January 15 demonstrated a small akinetic apical segment. No visible thrombus was noted.  Risk factors were significantly out of control on admission with elevated blood pressure and lipids. If the echo also  demonstrated evidence of long-standing poorly controlled hypertension based upon the degree of LVH.   Discharge Exam: Blood pressure 100/66, pulse 70, temperature 98.2 F (36.8 C), temperature source Oral, resp. rate 17, height 5\' 11"  (1.803 m), weight 104.01 kg (229 lb 4.8 oz), SpO2 99.00%.    S4 gallop.  Lungs clear.  Extremities reveal no edema. The radial cath site is unremarkable Labs:   Lab Results  Component Value Date   WBC 8.6 01/27/2012   HGB 16.1 01/27/2012   HCT 46.9 01/27/2012   MCV 92.1 01/27/2012   PLT 266 01/27/2012    Lab 01/26/12 0504  NA 136  K 4.2  CL 99  CO2 27  BUN 17  CREATININE 1.16  CALCIUM 9.3  PROT --  BILITOT --  ALKPHOS --  ALT --  AST --  GLUCOSE 104*   Lab Results  Component Value Date   CKTOTAL 205 05/14/2009   CKMB 1.4 05/14/2009   TROPONINI >20.00* 01/23/2012    Lab Results  Component Value Date   CHOL 252* 01/23/2012   Lab Results  Component Value Date   HDL 41 01/23/2012   Lab Results  Component Value Date   LDLCALC 167* 01/23/2012   Lab Results  Component Value Date   TRIG 220* 01/23/2012   Lab Results  Component Value Date   CHOLHDL 6.1 01/23/2012   No results found for this basename: LDLDIRECT      Radiology:  There is no evidence of CHF or cardiac enlargement.  EKG: Normal sinus rhythm with loss of anterior forces and also the presence of inferior Q waves are suggestive of apical infarction. The extent of Q-wave formation is out of proportion to the wall motion abnormality noted on echo.  FOLLOW  UP PLANS AND APPOINTMENTS Discharge Orders    Future Orders Please Complete By Expires   Amb Referral to Cardiac Rehabilitation          Medication List     As of 01/27/2012  8:03 AM    TAKE these medications         aspirin EC 81 MG tablet   Take 81 mg by mouth every morning.      atorvastatin 80 MG tablet   Commonly known as: LIPITOR   Take 0.5 tablets (40 mg total) by mouth daily at 6 PM.      clopidogrel 75  MG tablet   Commonly known as: PLAVIX   Take 1 tablet (75 mg total) by mouth daily with breakfast.      hydrochlorothiazide 12.5 MG capsule   Commonly known as: MICROZIDE   Take 1 capsule (12.5 mg total) by mouth daily.      lisinopril 20 MG tablet   Commonly known as: PRINIVIL,ZESTRIL   Take 1 tablet (20 mg total) by mouth daily.      metoprolol succinate 100 MG 24 hr tablet   Commonly known as: TOPROL-XL   Take 1 tablet each morning and 1/2 each evening.      nitroGLYCERIN 0.4 MG SL tablet   Commonly known as: NITROSTAT   Place 1 tablet (0.4 mg total) under the tongue every 5 (five) minutes x 3 doses as needed for chest pain.      Rivaroxaban 20 MG Tabs   Commonly known as: XARELTO   Take 1 tablet (20 mg total) by mouth daily before supper.           Follow-up Information    Follow up with [name], MD. On 02/07/2012. (2:30 PM)    Contact information:   [address] [place] [address] [phone]          BRING ALL MEDICATIONS WITH YOU TO FOLLOW UP APPOINTMENTS  Time spent with patient to include physician time: 30 minutes  Signed: [name] 01/27/2012, 8:03 AM

## 2012-02-10 ENCOUNTER — Encounter (HOSPITAL_COMMUNITY)
Admission: RE | Admit: 2012-02-10 | Discharge: 2012-02-10 | Disposition: A | Discharge status: Home / Self Care | Payer: BC Managed Care – PPO | Source: Ambulatory Visit | Attending: Interventional Cardiology | Admitting: Interventional Cardiology

## 2012-02-10 NOTE — Progress Notes (Addendum)
Cardiac Rehab Medication Review by a Pharmacist  Does the patient  feel that his/her medications are working for him/her?  yes  Has the patient been experiencing any side effects to the medications prescribed?  Yes,  Expresses dizziness with blood pressure medications and blood pressure dropping into 60's  Does the patient measure his/her own blood pressure or blood glucose at home?  no   Does the patient have any problems obtaining medications due to transportation or finances?   no  Understanding of regimen: good Understanding of indications: good Potential of compliance: excellent    Pharmacist comments:  Patient did not have any questions and said he had a good understand of his regimens. Patient is 100% compliance with his medications.    Dub Mikes 02/10/2012 8:32 AM

## 2012-02-14 ENCOUNTER — Encounter (HOSPITAL_COMMUNITY)
Admission: RE | Admit: 2012-02-14 | Discharge: 2012-02-14 | Disposition: A | Discharge status: Home / Self Care | Payer: BC Managed Care – PPO | Source: Ambulatory Visit | Attending: Interventional Cardiology | Admitting: Interventional Cardiology

## 2012-02-14 ENCOUNTER — Encounter (HOSPITAL_COMMUNITY): Payer: Self-pay

## 2012-02-14 DIAGNOSIS — I214 Non-ST elevation (NSTEMI) myocardial infarction: Secondary | ICD-10-CM | POA: Insufficient documentation

## 2012-02-14 NOTE — Progress Notes (Signed)
Pt started cardiac rehab today.  Pt tolerated light exercise without difficulty.   Asymptomatic.   VSS, telemetry-NSR.  Pt oriented to exercise equipment and routine.  Understanding verbalized. 

## 2012-02-16 ENCOUNTER — Encounter (HOSPITAL_COMMUNITY)
Admission: RE | Admit: 2012-02-16 | Discharge: 2012-02-16 | Disposition: A | Discharge status: Home / Self Care | Payer: BC Managed Care – PPO | Source: Ambulatory Visit | Attending: Interventional Cardiology | Admitting: Interventional Cardiology

## 2012-02-18 ENCOUNTER — Encounter (HOSPITAL_COMMUNITY)
Admission: RE | Admit: 2012-02-18 | Discharge: 2012-02-18 | Disposition: A | Discharge status: Home / Self Care | Payer: BC Managed Care – PPO | Source: Ambulatory Visit | Attending: Interventional Cardiology | Admitting: Interventional Cardiology

## 2012-02-21 ENCOUNTER — Encounter (HOSPITAL_COMMUNITY)
Admission: RE | Admit: 2012-02-21 | Discharge: 2012-02-21 | Disposition: A | Discharge status: Home / Self Care | Payer: BC Managed Care – PPO | Source: Ambulatory Visit | Attending: Interventional Cardiology | Admitting: Interventional Cardiology

## 2012-02-23 ENCOUNTER — Encounter (HOSPITAL_COMMUNITY): Payer: BC Managed Care – PPO

## 2012-02-25 ENCOUNTER — Encounter (HOSPITAL_COMMUNITY): Payer: BC Managed Care – PPO

## 2012-02-26 ENCOUNTER — Other Ambulatory Visit: Payer: Self-pay

## 2012-02-28 ENCOUNTER — Encounter (HOSPITAL_COMMUNITY)
Admission: RE | Admit: 2012-02-28 | Discharge: 2012-02-28 | Disposition: A | Discharge status: Home / Self Care | Payer: BC Managed Care – PPO | Source: Ambulatory Visit | Attending: Interventional Cardiology | Admitting: Interventional Cardiology

## 2012-02-28 ENCOUNTER — Telehealth (HOSPITAL_COMMUNITY): Payer: Self-pay | Admitting: Cardiac Rehabilitation

## 2012-02-28 NOTE — Telephone Encounter (Signed)
Pt left message he would be absent from cardiac rehab today for stomach virus

## 2012-03-01 ENCOUNTER — Encounter (HOSPITAL_COMMUNITY): Payer: BC Managed Care – PPO

## 2012-03-03 ENCOUNTER — Encounter (HOSPITAL_COMMUNITY)
Admission: RE | Admit: 2012-03-03 | Discharge: 2012-03-03 | Disposition: A | Discharge status: Home / Self Care | Payer: BC Managed Care – PPO | Source: Ambulatory Visit | Attending: Interventional Cardiology | Admitting: Interventional Cardiology

## 2012-03-03 NOTE — Progress Notes (Signed)
Pt hypotensive at cardiac rehab today.  Associated with dizziness during exercise. Lowest BP- 96/64 post exercise.  Pt is recovering from stomach virus earlier this week. Pt encourarged to increase po fluid intake. gatorade given, recheck BP-108/70  Symptoms resolved.  [place], Dr. Michaelle Copas nurse made aware.  Rehab report faxed.  Pt informed Dr. Katrinka Blazing aware.

## 2012-03-06 ENCOUNTER — Telehealth (HOSPITAL_COMMUNITY): Payer: Self-pay | Admitting: Cardiac Rehabilitation

## 2012-03-06 ENCOUNTER — Encounter (HOSPITAL_COMMUNITY): Payer: BC Managed Care – PPO

## 2012-03-06 NOTE — Telephone Encounter (Signed)
pc received from pt wife stating pt will be absent from cardiac rehab today for business meeting. Pt wife reports pt symptoms of fatigue with exertion continue.  Pt wife reports Dr Lonn [place] office called pt to decrease lisinopril 10mg  daily.  Advised pt wife continue to monitor.  If symptoms unimproved or worsen will need to make appt with Dr Katrinka Blazing for evaluation.  Understanding verbalized

## 2012-03-08 ENCOUNTER — Encounter (HOSPITAL_COMMUNITY): Payer: BC Managed Care – PPO

## 2012-03-10 ENCOUNTER — Encounter (HOSPITAL_COMMUNITY)
Admission: RE | Admit: 2012-03-10 | Discharge: 2012-03-10 | Disposition: A | Discharge status: Home / Self Care | Payer: BC Managed Care – PPO | Source: Ambulatory Visit | Attending: Interventional Cardiology | Admitting: Interventional Cardiology

## 2012-03-13 ENCOUNTER — Encounter (HOSPITAL_COMMUNITY): Payer: Self-pay

## 2012-03-13 ENCOUNTER — Encounter (HOSPITAL_COMMUNITY): Payer: BC Managed Care – PPO

## 2012-03-13 ENCOUNTER — Emergency Department (HOSPITAL_COMMUNITY)
Admission: EM | Admit: 2012-03-13 | Discharge: 2012-03-13 | Disposition: A | Discharge status: Home / Self Care | Payer: BC Managed Care – PPO | Source: Home / Self Care | Attending: Family Medicine | Admitting: Family Medicine

## 2012-03-13 ENCOUNTER — Emergency Department (INDEPENDENT_AMBULATORY_CARE_PROVIDER_SITE_OTHER): Payer: BC Managed Care – PPO

## 2012-03-13 DIAGNOSIS — M169 Osteoarthritis of hip, unspecified: Secondary | ICD-10-CM

## 2012-03-13 DIAGNOSIS — M160 Bilateral primary osteoarthritis of hip: Secondary | ICD-10-CM

## 2012-03-13 DIAGNOSIS — M1610 Unilateral primary osteoarthritis, unspecified hip: Secondary | ICD-10-CM

## 2012-03-13 MED ORDER — TRAMADOL HCL 50 MG PO TABS: 50.0000 mg | ORAL_TABLET | Freq: Four times a day (QID) | ORAL | Status: DC | PRN

## 2012-03-13 NOTE — ED Notes (Signed)
Pain left hip for 1 week; denies injury

## 2012-03-13 NOTE — ED Provider Notes (Signed)
History     CSN: [id number]  Arrival date & time 03/13/12  1223   First MD Initiated Contact with Patient 03/13/12 1403      Chief Complaint  Patient presents with  . Hip Pain    (Consider location/radiation/quality/duration/timing/severity/associated sxs/prior treatment) Patient is a 62 y.o. male presenting with hip pain. The history is provided by the patient.  Hip Pain This is a new problem. The current episode started more than 1 week ago. The problem has been gradually worsening.    Past Medical History  Diagnosis Date  . Hypertension   . Bronchitis   . Myocardial infarct     Past Surgical History  Procedure Laterality Date  . Coronary stent placement      History reviewed. No pertinent family history.  History  Substance Use Topics  . Smoking status: Current Some Day Smoker    Types: Cigars  . Smokeless tobacco: Not on file  . Alcohol Use: No      Review of Systems  Constitutional: Negative.   Musculoskeletal: Positive for myalgias. Negative for back pain, joint swelling and gait problem.  Skin: Negative.     Allergies  Review of patient's allergies indicates no known allergies.  Home Medications   Current Outpatient Rx  Name  Route  Sig  Dispense  Refill  . aspirin EC 81 MG tablet   Oral   Take 81 mg by mouth every morning.         Marland Kitchen atorvastatin (LIPITOR) 80 MG tablet   Oral   Take 0.5 tablets (40 mg total) by mouth daily at 6 PM.   30 tablet   11     Dispense as written.   . cholecalciferol (VITAMIN D) 1000 UNITS tablet   Oral   Take 2,000 Units by mouth daily.         . clopidogrel (PLAVIX) 75 MG tablet   Oral   Take 1 tablet (75 mg total) by mouth daily with breakfast.   30 tablet   11   . co-enzyme Q-10 50 MG capsule   Oral   Take 100 mg by mouth daily.         . fish oil-omega-3 fatty acids 1000 MG capsule   Oral   Take 1 g by mouth daily.         . hydrochlorothiazide (MICROZIDE) 12.5 MG capsule   Oral  Take 1 capsule (12.5 mg total) by mouth daily.   30 capsule   11   . lisinopril (PRINIVIL,ZESTRIL) 20 MG tablet   Oral   Take 1 tablet (20 mg total) by mouth daily.   30 tablet   11   . metoprolol succinate (TOPROL-XL) 100 MG 24 hr tablet      Take 1 tablet each morning and 1/2 each evening.   45 tablet   11   . Multiple Vitamins-Minerals (MULTIVITAMIN WITH MINERALS) tablet   Oral   Take 1 tablet by mouth daily.         . nitroGLYCERIN (NITROSTAT) 0.4 MG SL tablet   Sublingual   Place 1 tablet (0.4 mg total) under the tongue every 5 (five) minutes x 3 doses as needed for chest pain.   25 tablet   3   . Rivaroxaban (XARELTO) 20 MG TABS   Oral   Take 1 tablet (20 mg total) by mouth daily before supper.   30 tablet   2   . traMADol (ULTRAM) 50 MG tablet   Oral  Take 1 tablet (50 mg total) by mouth every 6 (six) hours as needed for pain.   30 tablet   0     BP 100/68  Pulse 56  Temp(Src) 98.5 F (36.9 C) (Oral)  SpO2 98%  Physical Exam  Nursing note and vitals reviewed. Constitutional: He is oriented to person, place, and time. He appears well-nourished.  Musculoskeletal: He exhibits tenderness.       Legs: Neurological: He is alert and oriented to person, place, and time.  Skin: Skin is warm and dry.  Psychiatric: He has a normal mood and affect.    ED Course  Procedures (including critical care time)  Labs Reviewed - No data to display Dg Hip Complete Left  03/13/2012  *RADIOLOGY REPORT*  Clinical Data: Left hip pain for 1 week.  No known injury.  LEFT HIP - COMPLETE 2+ VIEW  Comparison: None.  Findings: There is no acute bony or joint abnormality.  The patient has advanced bilateral hip osteoarthritis with marked joint space narrowing, subchondral cyst formation and osteophytosis seen about both hips.  Symphysis pubis and sacroiliac joints are unremarkable. Imaged soft tissue structures appear normal.  IMPRESSION:  1.  No acute finding. 2.  Advanced  bilateral hip osteoarthritis.   Original Report Authenticated By: Holley Dexter, M.D.      1. Osteoarthritis of both hips       MDM  X-rays reviewed and report per radiologist.         Linna Hoff, MD 03/13/12 [phone]

## 2012-03-15 ENCOUNTER — Encounter (HOSPITAL_COMMUNITY)
Admission: RE | Admit: 2012-03-15 | Discharge: 2012-03-15 | Disposition: A | Discharge status: Home / Self Care | Payer: BC Managed Care – PPO | Source: Ambulatory Visit | Attending: Interventional Cardiology | Admitting: Interventional Cardiology

## 2012-03-15 DIAGNOSIS — I214 Non-ST elevation (NSTEMI) myocardial infarction: Secondary | ICD-10-CM | POA: Insufficient documentation

## 2012-03-17 ENCOUNTER — Encounter (HOSPITAL_COMMUNITY): Payer: BC Managed Care – PPO

## 2012-03-20 ENCOUNTER — Encounter (HOSPITAL_COMMUNITY)
Admission: RE | Admit: 2012-03-20 | Discharge: 2012-03-20 | Disposition: A | Discharge status: Home / Self Care | Payer: BC Managed Care – PPO | Source: Ambulatory Visit | Attending: Interventional Cardiology | Admitting: Interventional Cardiology

## 2012-03-20 NOTE — Progress Notes (Signed)
Lawrence [name] 62 y.o. male Nutrition Note Spoke with pt.  Nutrition Plan and Nutrition Survey goals reviewed with pt. Pt is following Step 2 of the Therapeutic Lifestyle Changes diet. Pt wants to lose wt. Pt has been trying to lose wt by "making healthier food choices." Pt wt today 101.0 kg, which is down 1.8 kg (4 lb) over the past month. Wt loss tips briefly reviewed. Pt expressed understanding of the information reviewed. Pt aware of nutrition education classes offered and plans on attending nutrition classes.  Nutrition Diagnosis   Food-and nutrition-related knowledge deficit related to lack of exposure to information as related to diagnosis of: ? CVD   Obesity related to excessive energy intake as evidenced by a BMI of 32.8  Nutrition RX/ Estimated Daily Nutrition Needs for: wt loss  1800-2250 Kcal, 45-60 gm fat, 11-15 gm sat fat, 1.7-2.3 gm trans-fat, <1500 mg sodium   Nutrition Intervention   Pt's individual nutrition plan including cholesterol goals reviewed with pt.   Benefits of adopting Therapeutic Lifestyle Changes discussed when Medficts reviewed.   Pt to attend the Portion Distortion class - met 03/15/12   Pt to attend the  ? Nutrition I class                     ? Nutrition II class   Continue client-centered nutrition education by RD, as part of interdisciplinary care.  Goal(s)   Pt to identify food quantities necessary to achieve: ? wt loss to a goal wt of 202-214 lb (91.9-97.3 kg) at graduation from cardiac rehab.   Monitor and Evaluate progress toward nutrition goal with team. Nutrition Risk:  Low

## 2012-03-22 ENCOUNTER — Encounter (HOSPITAL_COMMUNITY)
Admission: RE | Admit: 2012-03-22 | Discharge: 2012-03-22 | Disposition: A | Discharge status: Home / Self Care | Payer: BC Managed Care – PPO | Source: Ambulatory Visit | Attending: Interventional Cardiology | Admitting: Interventional Cardiology

## 2012-03-24 ENCOUNTER — Encounter (HOSPITAL_COMMUNITY): Payer: BC Managed Care – PPO

## 2012-03-27 ENCOUNTER — Encounter (HOSPITAL_COMMUNITY): Payer: BC Managed Care – PPO

## 2012-03-29 ENCOUNTER — Encounter (HOSPITAL_COMMUNITY)
Admission: RE | Admit: 2012-03-29 | Discharge: 2012-03-29 | Disposition: A | Discharge status: Home / Self Care | Payer: BC Managed Care – PPO | Source: Ambulatory Visit | Attending: Interventional Cardiology | Admitting: Interventional Cardiology

## 2012-03-31 ENCOUNTER — Encounter (HOSPITAL_COMMUNITY): Payer: BC Managed Care – PPO

## 2012-04-03 ENCOUNTER — Encounter (HOSPITAL_COMMUNITY): Payer: BC Managed Care – PPO

## 2012-04-05 ENCOUNTER — Encounter (HOSPITAL_COMMUNITY): Payer: BC Managed Care – PPO

## 2012-04-07 ENCOUNTER — Encounter (HOSPITAL_COMMUNITY)
Admission: RE | Admit: 2012-04-07 | Discharge: 2012-04-07 | Disposition: A | Discharge status: Home / Self Care | Payer: BC Managed Care – PPO | Source: Ambulatory Visit | Attending: Interventional Cardiology | Admitting: Interventional Cardiology

## 2012-04-10 ENCOUNTER — Encounter (HOSPITAL_COMMUNITY): Payer: BC Managed Care – PPO

## 2012-04-12 ENCOUNTER — Encounter (HOSPITAL_COMMUNITY)
Admission: RE | Admit: 2012-04-12 | Discharge: 2012-04-12 | Disposition: A | Discharge status: Home / Self Care | Payer: BC Managed Care – PPO | Source: Ambulatory Visit | Attending: Interventional Cardiology | Admitting: Interventional Cardiology

## 2012-04-12 DIAGNOSIS — I214 Non-ST elevation (NSTEMI) myocardial infarction: Secondary | ICD-10-CM | POA: Insufficient documentation

## 2012-04-14 ENCOUNTER — Encounter (HOSPITAL_COMMUNITY): Payer: BC Managed Care – PPO

## 2012-04-17 ENCOUNTER — Encounter (HOSPITAL_COMMUNITY): Payer: BC Managed Care – PPO

## 2012-04-19 ENCOUNTER — Encounter (HOSPITAL_COMMUNITY): Payer: BC Managed Care – PPO

## 2012-04-21 ENCOUNTER — Encounter (HOSPITAL_COMMUNITY): Payer: BC Managed Care – PPO

## 2012-04-24 ENCOUNTER — Encounter (HOSPITAL_COMMUNITY)
Admission: RE | Admit: 2012-04-24 | Discharge: 2012-04-24 | Disposition: A | Discharge status: Home / Self Care | Payer: BC Managed Care – PPO | Source: Ambulatory Visit | Attending: Interventional Cardiology | Admitting: Interventional Cardiology

## 2012-04-26 ENCOUNTER — Encounter (HOSPITAL_COMMUNITY): Payer: BC Managed Care – PPO

## 2012-04-28 ENCOUNTER — Encounter (HOSPITAL_COMMUNITY)
Admission: RE | Admit: 2012-04-28 | Discharge: 2012-04-28 | Disposition: A | Discharge status: Home / Self Care | Payer: BC Managed Care – PPO | Source: Ambulatory Visit | Attending: Interventional Cardiology | Admitting: Interventional Cardiology

## 2012-05-01 ENCOUNTER — Encounter (HOSPITAL_COMMUNITY): Payer: BC Managed Care – PPO

## 2012-05-03 ENCOUNTER — Encounter (HOSPITAL_COMMUNITY)
Admission: RE | Admit: 2012-05-03 | Discharge: 2012-05-03 | Disposition: A | Discharge status: Home / Self Care | Payer: BC Managed Care – PPO | Source: Ambulatory Visit | Attending: Interventional Cardiology | Admitting: Interventional Cardiology

## 2012-05-05 ENCOUNTER — Encounter (HOSPITAL_COMMUNITY)
Admission: RE | Admit: 2012-05-05 | Discharge: 2012-05-05 | Disposition: A | Discharge status: Home / Self Care | Payer: BC Managed Care – PPO | Source: Ambulatory Visit | Attending: Interventional Cardiology | Admitting: Interventional Cardiology

## 2012-05-08 ENCOUNTER — Encounter (HOSPITAL_COMMUNITY): Payer: BC Managed Care – PPO

## 2012-05-10 ENCOUNTER — Encounter (HOSPITAL_COMMUNITY)
Admission: RE | Admit: 2012-05-10 | Discharge: 2012-05-10 | Disposition: A | Discharge status: Home / Self Care | Payer: BC Managed Care – PPO | Source: Ambulatory Visit | Attending: Interventional Cardiology | Admitting: Interventional Cardiology

## 2012-05-12 ENCOUNTER — Encounter (HOSPITAL_COMMUNITY)
Admission: RE | Admit: 2012-05-12 | Discharge: 2012-05-12 | Disposition: A | Discharge status: Home / Self Care | Payer: BC Managed Care – PPO | Source: Ambulatory Visit | Attending: Interventional Cardiology | Admitting: Interventional Cardiology

## 2012-05-12 DIAGNOSIS — I214 Non-ST elevation (NSTEMI) myocardial infarction: Secondary | ICD-10-CM | POA: Insufficient documentation

## 2012-05-12 NOTE — Progress Notes (Signed)
Pt reports medication adjustment per Dr. Katrinka Blazing. Pt states he is taking metoprolol 100mg  1/2 tab daily.  Med list reconciled.

## 2012-05-15 ENCOUNTER — Encounter (HOSPITAL_COMMUNITY): Payer: BC Managed Care – PPO

## 2012-05-17 ENCOUNTER — Encounter (HOSPITAL_COMMUNITY)
Admission: RE | Admit: 2012-05-17 | Discharge: 2012-05-17 | Disposition: A | Discharge status: Home / Self Care | Payer: BC Managed Care – PPO | Source: Ambulatory Visit | Attending: Interventional Cardiology | Admitting: Interventional Cardiology

## 2012-05-19 ENCOUNTER — Encounter (HOSPITAL_COMMUNITY): Payer: BC Managed Care – PPO

## 2012-06-02 ENCOUNTER — Telehealth (HOSPITAL_COMMUNITY): Payer: Self-pay | Admitting: Cardiac Rehabilitation

## 2012-06-02 ENCOUNTER — Ambulatory Visit (HOSPITAL_COMMUNITY): Payer: BC Managed Care – PPO

## 2012-06-02 NOTE — Telephone Encounter (Signed)
Pt called to report he is out of town for services.  He plans to return to rehab in next week.

## 2012-06-05 ENCOUNTER — Ambulatory Visit (HOSPITAL_COMMUNITY): Payer: BC Managed Care – PPO

## 2012-06-07 ENCOUNTER — Ambulatory Visit (HOSPITAL_COMMUNITY): Payer: BC Managed Care – PPO

## 2012-06-09 ENCOUNTER — Ambulatory Visit (HOSPITAL_COMMUNITY): Payer: BC Managed Care – PPO

## 2012-06-12 ENCOUNTER — Telehealth (HOSPITAL_COMMUNITY): Payer: Self-pay | Admitting: Cardiac Rehabilitation

## 2012-06-12 ENCOUNTER — Ambulatory Visit (HOSPITAL_COMMUNITY): Payer: BC Managed Care – PPO

## 2012-06-12 NOTE — Telephone Encounter (Signed)
Lm to assess reason for continued absence from cardiac rehab.

## 2012-06-14 ENCOUNTER — Ambulatory Visit (HOSPITAL_COMMUNITY): Payer: BC Managed Care – PPO

## 2012-06-15 ENCOUNTER — Encounter: Payer: Self-pay | Admitting: Pulmonary Disease

## 2012-06-15 ENCOUNTER — Ambulatory Visit (INDEPENDENT_AMBULATORY_CARE_PROVIDER_SITE_OTHER): Payer: BC Managed Care – PPO | Admitting: Pulmonary Disease

## 2012-06-15 VITALS — BP 118/72 | HR 85 | Temp 98.5°F | Ht 70.0 in | Wt 221.0 lb

## 2012-06-15 DIAGNOSIS — G4733 Obstructive sleep apnea (adult) (pediatric): Secondary | ICD-10-CM

## 2012-06-15 NOTE — Progress Notes (Signed)
Chief Complaint  Patient presents with  . Sleep Consult    Self referred. Currently using CPAP every night for 8 hours. Reports fatigue during the day. Epworth Score: 12.    History of Present Illness: Lawrence [name] is a 62 y.o. male for evaluation of sleep problems.  He is followed by Dr. Katrinka Blazing for his CAD and HTN.  He was diagnosed with OSA years ago, and was previously followed by Dr. Maple Hudson.  He has been using CPAP since diagnosis.  He is on the same settings from original set up.  He uses [facility] for his DME.  He has been noticing more trouble feeling sleepy during the day.  He was therefore referred for further evaluation of his sleep apnea.  He goes to sleep at 1030 pm.  He falls asleep quickly.  He wakes up 2 times to use the bathroom.  He gets out of bed at 630 am.  He feels okay in the morning.  He denies morning headache.  He does not use anything to help him fall sleep or stay awake.  He gets sleepy as the day goes on, and will take a nap.  He uses his CPAP when he naps if he plans to sleep for more than 20 minutes.  He does snore occasionally even while wearing CPAP.  He has noticed episodes of dry mouth.  This is usually associated with him feeling more tired.  He uses nasal mask.  This fits okay. He is due for a new mask.  He denies sleep walking, sleep talking, bruxism, or nightmares.  There is no history of restless legs.  He denies sleep hallucinations, sleep paralysis, or cataplexy.  The Epworth score is 12 out of 24.  Tests: CPAP 11/25/11 to 06/14/12 >> used on 180 of 203 nights with average 5 hrs 23 min.  Average AHI 2.8 with CPAP 8 cm H2O, significant leak  LEONDRO CORYELL  has a past medical history of Hypertension; Bronchitis; and Myocardial infarct.  KENSINGTON DUERST  has past surgical history that includes Coronary stent placement.  Prior to Admission medications   Medication Sig Start Date End Date Taking? Authorizing Provider  aspirin EC 81 MG tablet Take 81 mg by  mouth every morning.   Yes Historical Provider, MD  atorvastatin (LIPITOR) 80 MG tablet Take 0.5 tablets (40 mg total) by mouth daily at 6 PM. 01/27/12  Yes [name], MD  cholecalciferol (VITAMIN D) 1000 UNITS tablet Take 2,000 Units by mouth daily.   Yes Historical Provider, MD  clopidogrel (PLAVIX) 75 MG tablet Take 1 tablet (75 mg total) by mouth daily with breakfast. 01/27/12  Yes [name], MD  co-enzyme Q-10 50 MG capsule Take 100 mg by mouth daily.   Yes Historical Provider, MD  fish oil-omega-3 fatty acids 1000 MG capsule Take 1 g by mouth daily.   Yes Historical Provider, MD  hydrochlorothiazide (MICROZIDE) 12.5 MG capsule Take 1 capsule (12.5 mg total) by mouth daily. 01/27/12  Yes [name], MD  lisinopril (PRINIVIL,ZESTRIL) 20 MG tablet Take 1 tablet (20 mg total) by mouth daily. 01/27/12  Yes [name], MD  metoprolol succinate (TOPROL-XL) 100 MG 24 hr tablet Take 1/2 tablet each morning and 1/4 each evening. 01/27/12  Yes [name], MD  Multiple Vitamins-Minerals (MULTIVITAMIN WITH MINERALS) tablet Take 1 tablet by mouth daily.   Yes Historical Provider, MD  nitroGLYCERIN (NITROSTAT) 0.4 MG SL tablet Place  1 tablet (0.4 mg total) under the tongue every 5 (five) minutes x 3 doses as needed for chest pain. 01/27/12  Yes [name], MD  Rivaroxaban (XARELTO) 20 MG TABS Take 1 tablet (20 mg total) by mouth daily before supper. 01/27/12  Yes [name], MD  traMADol (ULTRAM) 50 MG tablet Take 1 tablet (50 mg total) by mouth every 6 (six) hours as needed for pain. 03/13/12  Yes Linna Hoff, MD    No Known Allergies  His family history is not on file.  He  reports that he quit smoking about 5 months ago. His smoking use included Cigars. He does not have any smokeless tobacco history on file. He reports that he does not drink alcohol or use illicit drugs.  Review of Systems  Constitutional: Negative for fever, chills, diaphoresis, activity  change, appetite change, fatigue and unexpected weight change.  HENT: Negative for hearing loss, ear pain, nosebleeds, congestion, sore throat, facial swelling, rhinorrhea, sneezing, mouth sores, trouble swallowing, neck pain, neck stiffness, dental problem, voice change, postnasal drip, sinus pressure, tinnitus and ear discharge.   Eyes: Negative for photophobia, discharge, itching and visual disturbance.  Respiratory: Negative for apnea, cough, choking, chest tightness, shortness of breath, wheezing and stridor.   Cardiovascular: Negative for chest pain, palpitations and leg swelling.  Gastrointestinal: Negative for nausea, vomiting, abdominal pain, constipation, blood in stool and abdominal distention.  Genitourinary: Negative for dysuria, urgency, frequency, hematuria, flank pain, decreased urine volume and difficulty urinating.  Musculoskeletal: Negative for myalgias, back pain, joint swelling, arthralgias and gait problem.  Skin: Negative for color change, pallor and rash.  Neurological: Negative for dizziness, tremors, seizures, syncope, speech difficulty, weakness, light-headedness, numbness and headaches.  Hematological: Negative for adenopathy. Does not bruise/bleed easily.  Psychiatric/Behavioral: Negative for confusion, sleep disturbance and agitation. The patient is not nervous/anxious.    Physical Exam:  General - No distress ENT - No sinus tenderness, no oral exudate, MP 3, no LAN, no thyromegaly, TM clear, pupils equal/reactive Cardiac - s1s2 regular, no murmur, pulses symmetric Chest - No wheeze/rales/dullness, good air entry, normal respiratory excursion Back - No focal tenderness Abd - Soft, non-tender, no organomegaly, + bowel sounds Ext - No edema Neuro - Normal strength, cranial nerves intact Skin - No rashes Psych - Normal mood, and behavior

## 2012-06-15 NOTE — Assessment & Plan Note (Signed)
He has history of OSA.  He has history of CAD and HTN.  He reports compliance, and this is confirmed on his download.  He does has significant leak from mask.  Will have him try new mask.  He is to call back if he still has trouble after getting new mask.  Otherwise plan to see him back in one year.

## 2012-06-15 NOTE — Progress Notes (Deleted)
  Subjective:    Patient ID: Lawrence [name], male    DOB: [birth year], 62 y.o.   MRN: [id number]  HPI    Review of Systems  Constitutional: Negative for fever, chills, diaphoresis, activity change, appetite change, fatigue and unexpected weight change.  HENT: Negative for hearing loss, ear pain, nosebleeds, congestion, sore throat, facial swelling, rhinorrhea, sneezing, mouth sores, trouble swallowing, neck pain, neck stiffness, dental problem, voice change, postnasal drip, sinus pressure, tinnitus and ear discharge.   Eyes: Negative for photophobia, discharge, itching and visual disturbance.  Respiratory: Negative for apnea, cough, choking, chest tightness, shortness of breath, wheezing and stridor.   Cardiovascular: Negative for chest pain, palpitations and leg swelling.  Gastrointestinal: Negative for nausea, vomiting, abdominal pain, constipation, blood in stool and abdominal distention.  Genitourinary: Negative for dysuria, urgency, frequency, hematuria, flank pain, decreased urine volume and difficulty urinating.  Musculoskeletal: Negative for myalgias, back pain, joint swelling, arthralgias and gait problem.  Skin: Negative for color change, pallor and rash.  Neurological: Negative for dizziness, tremors, seizures, syncope, speech difficulty, weakness, light-headedness, numbness and headaches.  Hematological: Negative for adenopathy. Does not bruise/bleed easily.  Psychiatric/Behavioral: Negative for confusion, sleep disturbance and agitation. The patient is not nervous/anxious.        Objective:   Physical Exam        Assessment & Plan:

## 2012-06-15 NOTE — Patient Instructions (Signed)
Call if still have trouble after getting new CPAP mask Follow up in 1 year

## 2012-06-16 ENCOUNTER — Ambulatory Visit (HOSPITAL_COMMUNITY): Payer: BC Managed Care – PPO

## 2012-06-19 ENCOUNTER — Ambulatory Visit (HOSPITAL_COMMUNITY): Payer: BC Managed Care – PPO

## 2012-06-21 ENCOUNTER — Ambulatory Visit (HOSPITAL_COMMUNITY): Payer: BC Managed Care – PPO

## 2012-06-23 ENCOUNTER — Ambulatory Visit (HOSPITAL_COMMUNITY): Payer: BC Managed Care – PPO

## 2012-06-26 ENCOUNTER — Ambulatory Visit (HOSPITAL_COMMUNITY): Payer: BC Managed Care – PPO

## 2012-06-28 ENCOUNTER — Ambulatory Visit (HOSPITAL_COMMUNITY): Payer: BC Managed Care – PPO

## 2012-06-30 ENCOUNTER — Ambulatory Visit (HOSPITAL_COMMUNITY): Payer: BC Managed Care – PPO

## 2012-07-03 ENCOUNTER — Ambulatory Visit (HOSPITAL_COMMUNITY): Payer: BC Managed Care – PPO

## 2012-07-05 ENCOUNTER — Ambulatory Visit (HOSPITAL_COMMUNITY): Payer: BC Managed Care – PPO

## 2012-07-07 ENCOUNTER — Ambulatory Visit (HOSPITAL_COMMUNITY): Payer: BC Managed Care – PPO

## 2012-07-10 ENCOUNTER — Ambulatory Visit (HOSPITAL_COMMUNITY): Payer: BC Managed Care – PPO

## 2012-07-12 ENCOUNTER — Ambulatory Visit (HOSPITAL_COMMUNITY): Payer: BC Managed Care – PPO

## 2012-07-17 ENCOUNTER — Ambulatory Visit (HOSPITAL_COMMUNITY): Payer: BC Managed Care – PPO

## 2012-11-16 ENCOUNTER — Other Ambulatory Visit: Payer: Self-pay

## 2012-11-24 ENCOUNTER — Encounter (HOSPITAL_COMMUNITY): Payer: Self-pay | Admitting: Emergency Medicine

## 2012-11-24 ENCOUNTER — Emergency Department (HOSPITAL_COMMUNITY)
Admission: EM | Admit: 2012-11-24 | Discharge: 2012-11-24 | Disposition: A | Discharge status: Home / Self Care | Payer: PRIVATE HEALTH INSURANCE | Attending: Emergency Medicine | Admitting: Emergency Medicine

## 2012-11-24 DIAGNOSIS — Z79899 Other long term (current) drug therapy: Secondary | ICD-10-CM | POA: Insufficient documentation

## 2012-11-24 DIAGNOSIS — S86112A Strain of other muscle(s) and tendon(s) of posterior muscle group at lower leg level, left leg, initial encounter: Secondary | ICD-10-CM

## 2012-11-24 DIAGNOSIS — I10 Essential (primary) hypertension: Secondary | ICD-10-CM | POA: Insufficient documentation

## 2012-11-24 DIAGNOSIS — Y9239 Other specified sports and athletic area as the place of occurrence of the external cause: Secondary | ICD-10-CM | POA: Insufficient documentation

## 2012-11-24 DIAGNOSIS — X500XXA Overexertion from strenuous movement or load, initial encounter: Secondary | ICD-10-CM | POA: Insufficient documentation

## 2012-11-24 DIAGNOSIS — Z7902 Long term (current) use of antithrombotics/antiplatelets: Secondary | ICD-10-CM | POA: Insufficient documentation

## 2012-11-24 DIAGNOSIS — Z9861 Coronary angioplasty status: Secondary | ICD-10-CM | POA: Insufficient documentation

## 2012-11-24 DIAGNOSIS — Z8709 Personal history of other diseases of the respiratory system: Secondary | ICD-10-CM | POA: Insufficient documentation

## 2012-11-24 DIAGNOSIS — I252 Old myocardial infarction: Secondary | ICD-10-CM | POA: Insufficient documentation

## 2012-11-24 DIAGNOSIS — Z87891 Personal history of nicotine dependence: Secondary | ICD-10-CM | POA: Insufficient documentation

## 2012-11-24 DIAGNOSIS — Y9367 Activity, basketball: Secondary | ICD-10-CM | POA: Insufficient documentation

## 2012-11-24 DIAGNOSIS — Z7982 Long term (current) use of aspirin: Secondary | ICD-10-CM | POA: Insufficient documentation

## 2012-11-24 DIAGNOSIS — IMO0002 Reserved for concepts with insufficient information to code with codable children: Secondary | ICD-10-CM | POA: Insufficient documentation

## 2012-11-24 NOTE — ED Notes (Signed)
Pt states he was on a treadmill about 10 days ago and felt pulling to back of his L calf near his ankle. Pt states the area got red and now he has some swelling in his ankle and ecchymosis to L foot. Pt ambulatory to exam room with steady gait.

## 2012-11-24 NOTE — ED Provider Notes (Signed)
CSN: [id number]     Arrival date & time 11/24/12  2024 History   This chart was scribed for non-physician practitioner Arthor Captain, PA-C working with Azalia Bilis, MD by Caryn Bee, ED Scribe. This patient was seen in room WTR8/WTR8 and the patient's care was started at 9:27 PM.    Chief Complaint  Patient presents with  . Foot Pain   HPI HPI Comments: Lawrence [name] is a 62 y.o. male who presents to the Emergency Department complaining of sudden onset, worsening left foot pain onset 10 days ago while pt was playing basketball at the gym. He states that he felt a pop in the back of his foot at onset when he went up for a jump shot. He was able to continue playing afterwards. Pt reports associated pain and swelling which has resolved somewhat. He regularly takes aspirin and lisinopril. Pt denies h/o DVT. Pt has h/o MI.   Past Medical History  Diagnosis Date  . Hypertension   . Bronchitis   . Myocardial infarct    Past Surgical History  Procedure Laterality Date  . Coronary stent placement     No family history on file. History  Substance Use Topics  . Smoking status: Former Smoker    Types: Cigars    Quit date: 01/12/2012  . Smokeless tobacco: Not on file  . Alcohol Use: No    Review of Systems  Musculoskeletal: Positive for arthralgias (left foot) and gait problem (slight limp).  Skin: Negative for wound.  Neurological: Negative for weakness and numbness.  Hematological: Does not bruise/bleed easily.    Allergies  Review of patient's allergies indicates no known allergies.  Home Medications   Current Outpatient Rx  Name  Route  Sig  Dispense  Refill  . aspirin EC 81 MG tablet   Oral   Take 81 mg by mouth every morning.         Marland Kitchen atorvastatin (LIPITOR) 80 MG tablet   Oral   Take 0.5 tablets (40 mg total) by mouth daily at 6 PM.   30 tablet   11     Dispense as written.   . cholecalciferol (VITAMIN D) 1000 UNITS tablet   Oral   Take 2,000 Units by  mouth daily.         . clomiPHENE (CLOMID) 50 MG tablet   Oral   Take 50 mg by mouth daily.         . clopidogrel (PLAVIX) 75 MG tablet   Oral   Take 1 tablet (75 mg total) by mouth daily with breakfast.   30 tablet   11   . co-enzyme Q-10 50 MG capsule   Oral   Take 50 mg by mouth every evening.          . fish oil-omega-3 fatty acids 1000 MG capsule   Oral   Take 1 g by mouth daily.         . hydrochlorothiazide (MICROZIDE) 12.5 MG capsule   Oral   Take 1 capsule (12.5 mg total) by mouth daily.   30 capsule   11   . lisinopril (PRINIVIL,ZESTRIL) 20 MG tablet   Oral   Take 10 mg by mouth daily.         . metoprolol succinate (TOPROL-XL) 100 MG 24 hr tablet   Oral   Take 50 mg by mouth every morning.           BP 155/93  Pulse 77  Temp(Src) 98.2 F (  36.8 C) (Oral)  Resp 18  SpO2 100%  Physical Exam  Nursing note and vitals reviewed. Constitutional: He is oriented to person, place, and time. He appears well-developed and well-nourished. No distress.  HENT:  Head: Normocephalic and atraumatic.  Eyes: EOM are normal.  Neck: Neck supple. No tracheal deviation present.  Cardiovascular: Normal rate.   Pulmonary/Chest: Effort normal. No respiratory distress.  Musculoskeletal: Normal range of motion. He exhibits edema and tenderness.  Negative Thompson's test. Tenderness in the middle of left calf. Pitting edema around the medial malleolus. Ecchymosis around the achilles and base of the left foot. FROM of the left ankle and knee. No heat. No redness. Distal pulses and sensation intact.   Neurological: He is alert and oriented to person, place, and time.  Skin: Skin is warm and dry.  Psychiatric: He has a normal mood and affect. His behavior is normal.    ED Course  Procedures (including critical care time) DIAGNOSTIC STUDIES: Oxygen Saturation is 100% on room air, normal by my interpretation.    COORDINATION OF CARE: 9:32 PM-Discussed treatment plan  with pt at bedside and pt agreed to plan.   Labs Review Labs Reviewed - No data to display Imaging Review No results found.  EKG Interpretation   None       MDM   1. Gastrocnemius muscle tear, left, initial encounter    Patient here with his wife, who is anxious about DVT. I have spoken with the patient and his wife. Doubt highly that there is any clot. Sxs and appearance characteristic of gastroc tear. Bruising and swelling in foot secondary to gravitational dependence. Patient wife insistent on US of the calf. Patient agrees to the test.   Doppler ultrasound was ordered, however I was informed that this service is unavailable at this time. Dr. Patria Mane saw the patient in shared visit and reassured the patient that the diagnosis is consistent with muscle tear and no [place] is necessary at this time. Patient advised of supportive care and to follow up with his orthopod.  Patient is in agreement with plan of care. Return precautions discussed to include increasing swelling, pain, heat redness, sob, hemoptysis, pleurisy, fever.  I personally performed the services described in this documentation, which was scribed in my presence. The recorded information has been reviewed and is accurate.     Arthor Captain, PA-C 11/25/12 [phone]

## 2012-11-27 NOTE — ED Provider Notes (Signed)
Medical screening examination/treatment/procedure(s) were performed by non-physician practitioner and as supervising physician I was immediately available for consultation/collaboration.  EKG Interpretation   None         Lyanne Co, MD 11/27/12 1500

## 2012-12-12 ENCOUNTER — Encounter: Payer: Self-pay | Admitting: *Deleted

## 2012-12-12 ENCOUNTER — Encounter: Payer: Self-pay | Admitting: Interventional Cardiology

## 2012-12-13 ENCOUNTER — Ambulatory Visit: Payer: BC Managed Care – PPO | Admitting: Interventional Cardiology

## 2013-01-05 ENCOUNTER — Encounter: Payer: Self-pay | Admitting: Interventional Cardiology

## 2013-01-22 ENCOUNTER — Telehealth: Payer: Self-pay | Admitting: *Deleted

## 2013-01-22 NOTE — Telephone Encounter (Signed)
Patient requests plavix refill [facility] [address]. Thanks, MI

## 2013-01-23 ENCOUNTER — Telehealth: Payer: Self-pay | Admitting: Interventional Cardiology

## 2013-01-23 MED ORDER — CLOPIDOGREL BISULFATE 75 MG PO TABS: 75.0000 mg | ORAL_TABLET | Freq: Every day | ORAL | Status: DC

## 2013-01-23 NOTE — Telephone Encounter (Signed)
New message     Refill plavix 75mg --------[facility]-----pt has not seen dr Katrinka Blazingsmith at new office

## 2013-01-23 NOTE — Telephone Encounter (Signed)
Done

## 2013-01-23 NOTE — Addendum Note (Signed)
Addended by: Jarvis NewcomerPARRIS-GODLEY, Alamin Mccuiston S on: 01/23/2013 09:24 AM   Modules accepted: Orders

## 2013-01-31 ENCOUNTER — Other Ambulatory Visit: Payer: Self-pay | Admitting: Interventional Cardiology

## 2013-02-15 ENCOUNTER — Telehealth: Payer: Self-pay | Admitting: *Deleted

## 2013-02-15 NOTE — Telephone Encounter (Signed)
[facility] on [facility]itor and metoprolol for patient. Thanks, MI

## 2013-02-16 MED ORDER — ATORVASTATIN CALCIUM 80 MG PO TABS: 40.0000 mg | ORAL_TABLET | Freq: Every day | ORAL | Status: DC

## 2013-02-16 MED ORDER — METOPROLOL SUCCINATE ER 50 MG PO TB24: 50.0000 mg | ORAL_TABLET | Freq: Every morning | ORAL | Status: DC

## 2013-02-16 NOTE — Telephone Encounter (Signed)
Done

## 2013-02-22 ENCOUNTER — Ambulatory Visit (INDEPENDENT_AMBULATORY_CARE_PROVIDER_SITE_OTHER): Payer: PRIVATE HEALTH INSURANCE | Admitting: Physician Assistant

## 2013-02-22 ENCOUNTER — Encounter: Payer: Self-pay | Admitting: Physician Assistant

## 2013-02-22 VITALS — BP 111/80 | HR 68 | Ht 70.0 in | Wt 233.0 lb

## 2013-02-22 DIAGNOSIS — I10 Essential (primary) hypertension: Secondary | ICD-10-CM

## 2013-02-22 DIAGNOSIS — F411 Generalized anxiety disorder: Secondary | ICD-10-CM

## 2013-02-22 DIAGNOSIS — F419 Anxiety disorder, unspecified: Secondary | ICD-10-CM

## 2013-02-22 DIAGNOSIS — I2510 Atherosclerotic heart disease of native coronary artery without angina pectoris: Secondary | ICD-10-CM

## 2013-02-22 DIAGNOSIS — E785 Hyperlipidemia, unspecified: Secondary | ICD-10-CM

## 2013-02-22 MED ORDER — ALPRAZOLAM 0.25 MG PO TABS: 0.2500 mg | ORAL_TABLET | Freq: Every evening | ORAL | Status: DC | PRN

## 2013-02-22 NOTE — Progress Notes (Signed)
[address] [place], [place]  [address] Phone: [phone] Fax:  [phone]) [phone]  Date:  02/22/2013   ID:  Lawrence [name], DOB Jun 07, 1950, MRN [id number]  PCP:  Gwynneth AlimentSANDERS,ROBYN N, MD  Cardiologist:  Dr. Verdis PrimeHenry Smith    History of Present Illness: Lawrence [name] is a 63 y.o. male with a history of CAD, status post non-STEMI in 01/2012.  Balloon angioplasty of the distal LAD was unsuccessful. He did have apical wall motion abnormality and was treated with anticoagulation for 6 weeks post MI. Other problems include sleep apnea, HTN, HL. Last seen by Dr. Katrinka BlazingSmith 06/2012.  LHC (01/2012): Ostial/proximal LAD 50%, distal LAD occluded near the apex with faint left to left collaterals, EF 50% with apical akinesis.  PCI: Balloon angioplasty of the distal LAD unsuccessful. Echocardiogram (01/26/12): Severe LVH, EF 50-55%, severe hypokinesis of the apical myocardium, mild LAE. ETT-Myoview (03/2012): Apical scar, no ischemia, EF 69%.  Since last seen, he has done well.  The patient denies chest pain, shortness of breath, syncope, orthopnea, PND or significant pedal edema.   Recent Labs: No results found for requested labs within last 365 days.  Wt Readings from Last 3 Encounters:  02/22/13 233 lb (105.688 kg)  06/15/12 221 lb (100.245 kg)  02/10/12 226 lb 10.1 oz (102.8 kg)     Past Medical History  Diagnosis Date  . Hypertension   . Bronchitis   . Myocardial infarct   . Hyperlipidemia LDL goal < 70   . Left ventricular aneurysm   . NSTEMI (non-ST elevated myocardial infarction)     Current Outpatient Prescriptions  Medication Sig Dispense Refill  . Ascorbic Acid (VITAMIN C) 1000 MG tablet Take 1,000 mg by mouth daily.      Marland Kitchen. aspirin EC 81 MG tablet Take 81 mg by mouth every morning.      Marland Kitchen. atorvastatin (LIPITOR) 80 MG tablet Take 0.5 tablets (40 mg total) by mouth daily at 6 PM.  15 tablet  5  . Cholecalciferol (VITAMIN D3) 1000 UNITS CAPS Take 1 capsule by mouth daily.       .  clopidogrel (PLAVIX) 75 MG tablet Take 1 tablet (75 mg total) by mouth daily with breakfast.  30 tablet  7  . co-enzyme Q-10 50 MG capsule Take 50 mg by mouth every evening.       . fish oil-omega-3 fatty acids 1000 MG capsule Take 1 g by mouth daily.      Marland Kitchen. guaifenesin (ROBITUSSIN) 100 MG/5ML syrup Take 200 mg by mouth 3 (three) times daily as needed for cough.      . hydrochlorothiazide (MICROZIDE) 12.5 MG capsule TAKE ONE CAPSULE BY MOUTH EVERY DAY  30 capsule  6  . lisinopril (PRINIVIL,ZESTRIL) 20 MG tablet Take 10 mg by mouth daily.      . metoprolol succinate (TOPROL-XL) 50 MG 24 hr tablet Take 1 tablet (50 mg total) by mouth every morning.  30 tablet  5   No current facility-administered medications for this visit.    Allergies:   Review of patient's allergies indicates no known allergies.   Social History:  The patient  reports that he quit smoking about 13 months ago. His smoking use included Cigars. He does not have any smokeless tobacco history on file. He reports that he does not drink alcohol or use illicit drugs.   Family History:  The patient's family history is not on file.   ROS:  Please see the history of present  illness.   He is getting ready to go out of the country. He has significant anxiety on airplanes and is requesting anxiolytic therapy.   All other systems reviewed and negative.   PHYSICAL EXAM: VS:  BP 111/80  Pulse 68  Ht 5\' 10"  (1.778 m)  Wt 233 lb (105.688 kg)  BMI 33.43 kg/m2 Well nourished, well developed, in no acute distress HEENT: normal Neck: no JVD Vascular: No carotid bruits Cardiac:  normal S1, S2; RRR; no murmur Lungs:  clear to auscultation bilaterally, no wheezing, rhonchi or rales Abd: soft, nontender, no hepatomegaly Ext: no edema Skin: warm and dry Neuro:  CNs 2-12 intact, no focal abnormalities noted  EKG:  NSR, HR 68, inferior Q waves, anterolateral Q waves, no significant change when compared to prior tracings     ASSESSMENT AND  PLAN:  1. CAD: Stable. No angina. Continue aspirin, Plavix, statin, beta blocker. 2. Hypertension: Controlled. 3. Hyperlipidemia: Continue statin. Labs are followed by primary care. 4. Anxiety: I have offered him a prescription for Xanax 0.25 mg one daily as needed. I Lawrence give him a prescription for #5, no refills. Should he need refills, he Lawrence need to obtain them from primary care. 5. Disposition: Follow up with Dr. Katrinka Blazing in 6 months.  Signed, Tereso Newcomer, PA-C  02/22/2013 12:36 PM

## 2013-02-22 NOTE — Patient Instructions (Signed)
Your physician has recommended you make the following change in your medication: 1. Xanax 0.25 MG Daily as needed for flying  Your physician wants you to follow-up in: 6 Months with Dr Marlou StarksSmith You will receive a reminder letter in the mail two months in advance. If you don't receive a letter, please call our office to schedule the follow-up appointment.

## 2013-02-24 ENCOUNTER — Other Ambulatory Visit: Payer: Self-pay | Admitting: Cardiology

## 2013-02-24 MED ORDER — NITROGLYCERIN 0.4 MG SL SUBL: 0.4000 mg | SUBLINGUAL_TABLET | SUBLINGUAL | Status: DC | PRN

## 2013-03-26 ENCOUNTER — Telehealth: Payer: Self-pay

## 2013-03-27 ENCOUNTER — Encounter: Payer: Self-pay | Admitting: Interventional Cardiology

## 2013-03-27 ENCOUNTER — Telehealth: Payer: Self-pay | Admitting: Interventional Cardiology

## 2013-03-27 NOTE — Telephone Encounter (Signed)
called pt.pt aware refill rqst for Xanax needs to be sent to his pcp.pt verbalized understanding.

## 2013-03-27 NOTE — Telephone Encounter (Signed)
Per Bing Neighb[place]Scott W. PA xanax was a 1 time [place] [facility] he will need xanax filled with his PCP.

## 2013-05-11 NOTE — Telephone Encounter (Signed)
error 

## 2013-07-27 ENCOUNTER — Emergency Department (HOSPITAL_COMMUNITY)
Admission: EM | Admit: 2013-07-27 | Discharge: 2013-07-28 | Disposition: A | Discharge status: Home / Self Care | Payer: 59 | Attending: Emergency Medicine | Admitting: Emergency Medicine

## 2013-07-27 ENCOUNTER — Emergency Department (HOSPITAL_COMMUNITY): Payer: 59

## 2013-07-27 ENCOUNTER — Encounter (HOSPITAL_COMMUNITY): Payer: Self-pay | Admitting: Emergency Medicine

## 2013-07-27 DIAGNOSIS — Z79899 Other long term (current) drug therapy: Secondary | ICD-10-CM | POA: Diagnosis not present

## 2013-07-27 DIAGNOSIS — Z87891 Personal history of nicotine dependence: Secondary | ICD-10-CM | POA: Insufficient documentation

## 2013-07-27 DIAGNOSIS — IMO0002 Reserved for concepts with insufficient information to code with codable children: Secondary | ICD-10-CM | POA: Insufficient documentation

## 2013-07-27 DIAGNOSIS — I10 Essential (primary) hypertension: Secondary | ICD-10-CM | POA: Insufficient documentation

## 2013-07-27 DIAGNOSIS — I252 Old myocardial infarction: Secondary | ICD-10-CM | POA: Diagnosis not present

## 2013-07-27 DIAGNOSIS — K219 Gastro-esophageal reflux disease without esophagitis: Secondary | ICD-10-CM | POA: Insufficient documentation

## 2013-07-27 DIAGNOSIS — J40 Bronchitis, not specified as acute or chronic: Secondary | ICD-10-CM | POA: Insufficient documentation

## 2013-07-27 DIAGNOSIS — Z7901 Long term (current) use of anticoagulants: Secondary | ICD-10-CM | POA: Insufficient documentation

## 2013-07-27 DIAGNOSIS — E785 Hyperlipidemia, unspecified: Secondary | ICD-10-CM | POA: Diagnosis not present

## 2013-07-27 DIAGNOSIS — Z9861 Coronary angioplasty status: Secondary | ICD-10-CM | POA: Diagnosis not present

## 2013-07-27 LAB — CBC
HCT: 45.4 % (ref 39.0–52.0)
Hemoglobin: 15.4 g/dL (ref 13.0–17.0)
MCH: 32 pg (ref 26.0–34.0)
MCHC: 33.9 g/dL (ref 30.0–36.0)
MCV: 94.2 fL (ref 78.0–100.0)
Platelets: 231 10*3/uL (ref 150–400)
RBC: 4.82 MIL/uL (ref 4.22–5.81)
RDW: 14 % (ref 11.5–15.5)
WBC: 7.5 10*3/uL (ref 4.0–10.5)

## 2013-07-27 LAB — COMPREHENSIVE METABOLIC PANEL
ALT: 28 U/L (ref 0–53)
AST: 31 U/L (ref 0–37)
Albumin: 4.3 g/dL (ref 3.5–5.2)
Alkaline Phosphatase: 68 U/L (ref 39–117)
Anion gap: 17 — ABNORMAL HIGH (ref 5–15)
BUN: 19 mg/dL (ref 6–23)
CO2: 25 mEq/L (ref 19–32)
Calcium: 9 mg/dL (ref 8.4–10.5)
Chloride: 99 mEq/L (ref 96–112)
Creatinine, Ser: 1.07 mg/dL (ref 0.50–1.35)
GFR calc Af Amer: 85 mL/min — ABNORMAL LOW (ref >90)
GFR calc non Af Amer: 74 mL/min — ABNORMAL LOW (ref >90)
Glucose, Bld: 104 mg/dL — ABNORMAL HIGH (ref 70–99)
Potassium: 3.9 mEq/L (ref 3.7–5.3)
Sodium: 141 mEq/L (ref 137–147)
Total Bilirubin: 0.4 mg/dL (ref 0.3–1.2)
Total Protein: 7.6 g/dL (ref 6.0–8.3)

## 2013-07-27 LAB — I-STAT TROPONIN, ED: Troponin i, poc: 0.01 ng/mL (ref 0.00–0.08)

## 2013-07-27 NOTE — ED Notes (Signed)
Last ate 1900, took nexium around 1600, sx onset this am, "indigestion all day long", describes as burning in throat area, (denies: pain, sob, nv, dizziness, fever, palpitations, fluttering, numbness or tingling). Rates 2/10 at this time. Pt alert, NAD, calm, interactive, resps e/u, speaking in clear complete sentences, VSS.

## 2013-07-27 NOTE — ED Notes (Signed)
Pt c/o acid reflux, high blood pressure, mild weakness, and dizziness. Pt denies chest pain, shortness of breath, n/v and diaphoresis. Pt states his blood pressure at home was 134/104. Pt has hx of MI year and a half ago

## 2013-07-28 LAB — TROPONIN I: Troponin I: <0.3 ng/mL (ref <0.30)

## 2013-07-28 NOTE — ED Provider Notes (Signed)
CSN: [id number]     Arrival date & time 07/27/13  2132 History   First MD Initiated Contact with Patient 07/28/13 0014     Chief Complaint  Patient presents with  . Gastrophageal Reflux  . Fatigue  . Dizziness     (Consider location/radiation/quality/duration/timing/severity/associated sxs/prior Treatment) Patient is a 63 y.o. male presenting with GERD.  Gastrophageal Reflux This is a recurrent problem. The current episode started 12 to 24 hours ago. The problem occurs constantly. The problem has been resolved. Pertinent negatives include no chest pain, no abdominal pain and no shortness of breath. Associated symptoms comments: "burning in my throat when I belched.". Exacerbated by: belching. Relieved by: nexium.    Past Medical History  Diagnosis Date  . Hypertension   . Bronchitis   . Myocardial infarct   . Hyperlipidemia LDL goal < 70   . Left ventricular aneurysm   . NSTEMI (non-ST elevated myocardial infarction)    Past Surgical History  Procedure Laterality Date  . Coronary stent placement     History reviewed. No pertinent family history. History  Substance Use Topics  . Smoking status: Former Smoker    Types: Cigars    Quit date: 01/12/2012  . Smokeless tobacco: Not on file  . Alcohol Use: No    Review of Systems  Respiratory: Negative for shortness of breath.   Cardiovascular: Negative for chest pain.  Gastrointestinal: Negative for abdominal pain.  Neurological: Positive for dizziness.  All other systems reviewed and are negative.     Allergies  Review of patient's allergies indicates no known allergies.  Home Medications   Prior to Admission medications   Medication Sig Start Date End Date Taking? Authorizing Provider  ALPRAZolam (XANAX) 0.25 MG tablet Take 1 tablet (0.25 mg total) by mouth at bedtime as needed for anxiety. 02/22/13  Yes Scott Moishe Spice Weaver, PA-C  Ascorbic Acid (VITAMIN C) 1000 MG tablet Take 1,000 mg by mouth daily.   Yes Historical  Provider, MD  aspirin EC 81 MG tablet Take 81 mg by mouth every morning.   Yes Historical Provider, MD  atorvastatin (LIPITOR) 80 MG tablet Take 0.5 tablets (40 mg total) by mouth daily at 6 PM. 02/16/13  Yes Lyn RecordsHenry W Smith III, MD  Cholecalciferol (VITAMIN D3) 1000 UNITS CAPS Take 1 capsule by mouth daily.    Yes Historical Provider, MD  clopidogrel (PLAVIX) 75 MG tablet Take 1 tablet (75 mg total) by mouth daily with breakfast. 01/23/13  Yes Lyn RecordsHenry W Smith III, MD  co-enzyme Q-10 50 MG capsule Take 50 mg by mouth every evening.    Yes Historical Provider, MD  fish oil-omega-3 fatty acids 1000 MG capsule Take 1 g by mouth daily.   Yes Historical Provider, MD  guaifenesin (ROBITUSSIN) 100 MG/5ML syrup Take 200 mg by mouth 3 (three) times daily as needed for cough.   Yes Historical Provider, MD  hydrochlorothiazide (MICROZIDE) 12.5 MG capsule TAKE ONE CAPSULE BY MOUTH EVERY DAY 01/31/13  Yes Lyn RecordsHenry W Smith III, MD  lisinopril (PRINIVIL,ZESTRIL) 10 MG tablet Take 10 mg by mouth every evening.   Yes Historical Provider, MD  metoprolol succinate (TOPROL-XL) 50 MG 24 hr tablet Take 1 tablet (50 mg total) by mouth every morning. 02/16/13  Yes Lyn RecordsHenry W Smith III, MD  nitroGLYCERIN (NITROSTAT) 0.4 MG SL tablet Place 1 tablet (0.4 mg total) under the tongue every 5 (five) minutes as needed for chest pain. 02/24/13  Yes Brittainy Simmons, PA-C   BP 94/52  Pulse 73  Temp(Src) 98.5 F (36.9 C) (Oral)  Resp 12  Ht 5\' 10"  (1.778 m)  Wt 225 lb (102.059 kg)  BMI 32.28 kg/m2  SpO2 97% Physical Exam  Nursing note and vitals reviewed. Constitutional: He is oriented to person, place, and time. He appears well-developed and well-nourished. No distress.  HENT:  Head: Normocephalic and atraumatic.  Mouth/Throat: Oropharynx is clear and moist.  Eyes: Conjunctivae are normal. Pupils are equal, round, and reactive to light. No scleral icterus.  Neck: Neck supple.  Cardiovascular: Normal rate, regular rhythm, normal heart  sounds and intact distal pulses.   No murmur heard. Pulmonary/Chest: Effort normal and breath sounds normal. No stridor. No respiratory distress. He has no wheezes. He has no rales.  Abdominal: Soft. He exhibits no distension. There is no tenderness.  Musculoskeletal: Normal range of motion. He exhibits no edema.  Neurological: He is alert and oriented to person, place, and time.  Skin: Skin is warm and dry. No rash noted.  Psychiatric: He has a normal mood and affect. His behavior is normal.    ED Course  Procedures (including critical care time) Labs Review Labs Reviewed  COMPREHENSIVE METABOLIC PANEL - Abnormal; Notable for the following:    Glucose, Bld 104 (*)    GFR calc non Af Amer 74 (*)    GFR calc Af Amer 85 (*)    Anion gap 17 (*)    All other components within normal limits  CBC  I-STAT TROPOININ, ED    Imaging Review Dg Chest 2 View  07/27/2013   CLINICAL DATA:  Mild weakness and dizziness  EXAM: CHEST  2 VIEW  COMPARISON:  CT chest 01/23/2012  FINDINGS: There is elevation of the right diaphragm. There is no focal parenchymal opacity, pleural effusion, or pneumothorax. The heart and mediastinal contours are unremarkable.  The osseous structures are unremarkable.  IMPRESSION: No active cardiopulmonary disease.   Electronically Signed   By: Elige Ko   On: 07/27/2013 22:38  All radiology studies independently viewed by me.      EKG Interpretation   Date/Time:  Friday July 27 2013 21:36:19 EDT Ventricular Rate:  84 PR Interval:  176 QRS Duration: 86 QT Interval:  376 QTC Calculation: 444 R Axis:   -72 Text Interpretation:  Normal sinus rhythm Left anterior fascicular block  Cannot rule out Inferior infarct (masked by fascicular block?) , age  undetermined Anterolateral infarct , age undetermined Abnormal ECG  compared to prior, t wave abnormality improved Confirmed by [facility]  MD,  TREY ([id number]) on 07/28/2013 1:23:06 AM      MDM   Final diagnoses:   Gastroesophageal reflux disease, esophagitis presence not specified    63 year old male presenting with chief complaint of acid reflux and hypertension. The symptoms started today. He is primarily concerned because he had a heart attack a year and half ago, and he wants to make sure that he is not having repeat heart trouble. His symptoms were very mild and have resolved at this point. He did not have any chest pain, shortness of breath, diaphoresis, or nausea. He had no abdominal pain. He reports some transient lightheadedness, which has since resolved.    I suspect his symptoms are GI, not Cardiac.  His workup is reassuring. Two troponins were negative.  Plan dc with outpatient follow up.  Candyce Churn III, MD 07/28/13 [phone]

## 2013-07-28 NOTE — ED Notes (Signed)
EDP in to see pt, pt updated, no changes, "feels better", VSS, pending disposition.

## 2013-08-27 ENCOUNTER — Ambulatory Visit: Payer: PRIVATE HEALTH INSURANCE | Admitting: Pulmonary Disease

## 2013-08-28 ENCOUNTER — Other Ambulatory Visit: Payer: Self-pay

## 2013-08-28 MED ORDER — ATORVASTATIN CALCIUM 80 MG PO TABS: 40.0000 mg | ORAL_TABLET | Freq: Every day | ORAL | Status: DC

## 2013-09-25 ENCOUNTER — Ambulatory Visit: Payer: PRIVATE HEALTH INSURANCE | Admitting: Pulmonary Disease

## 2013-11-10 ENCOUNTER — Other Ambulatory Visit: Payer: Self-pay

## 2013-11-10 MED ORDER — CLOPIDOGREL BISULFATE 75 MG PO TABS: 75.0000 mg | ORAL_TABLET | Freq: Every day | ORAL | Status: DC

## 2013-11-27 ENCOUNTER — Other Ambulatory Visit: Payer: Self-pay | Admitting: Interventional Cardiology

## 2013-11-29 ENCOUNTER — Encounter: Payer: Self-pay | Admitting: Interventional Cardiology

## 2013-11-29 ENCOUNTER — Ambulatory Visit (INDEPENDENT_AMBULATORY_CARE_PROVIDER_SITE_OTHER): Payer: 59 | Admitting: Interventional Cardiology

## 2013-11-29 VITALS — BP 132/88 | HR 70 | Ht 71.0 in | Wt 233.0 lb

## 2013-11-29 DIAGNOSIS — G4733 Obstructive sleep apnea (adult) (pediatric): Secondary | ICD-10-CM

## 2013-11-29 DIAGNOSIS — Z01810 Encounter for preprocedural cardiovascular examination: Secondary | ICD-10-CM

## 2013-11-29 DIAGNOSIS — I2510 Atherosclerotic heart disease of native coronary artery without angina pectoris: Secondary | ICD-10-CM

## 2013-11-29 DIAGNOSIS — E785 Hyperlipidemia, unspecified: Secondary | ICD-10-CM

## 2013-11-29 DIAGNOSIS — I10 Essential (primary) hypertension: Secondary | ICD-10-CM

## 2013-11-29 HISTORY — DX: Atherosclerotic heart disease of native coronary artery without angina pectoris: I25.10

## 2013-11-29 NOTE — Patient Instructions (Signed)
Your physician recommends that you continue on your current medications as directed. Please refer to the Current Medication list given to you today.  Your physician has requested that you have an exercise tolerance test. For further information please visit [email or web address]. Please also follow instruction sheet, as given.( To be scheduled asap)  Your physician recommends that you return for a FASTING lipid profile: and alt the same day as treadmill test  Your physician wants you to follow-up in: 1 year with Dr.Smith You will receive a reminder letter in the mail two months in advance. If you don't receive a letter, please call our office to schedule the follow-up appointment.

## 2013-11-29 NOTE — Progress Notes (Signed)
Patient ID: Lawrence BonnetCharles E Andujar, male   DOB: 1950/09/04, 63 y.o.   MRN: [id number]    [address] [place], [place]  [address] Phone: [phone] Fax:  [phone]  Date:  11/29/2013   ID:  Lawrence BonnetCharles E Vassar, DOB 1950/09/04, MRN [id number]  PCP:  Gwynneth AlimentSANDERS,ROBYN N, MD   ASSESSMENT:  1. 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 Hyperlipidemia, current status unknown 3. Hypertension, stable 4. Preoperative cardiovascular clearance for left shoulder arthroscopy by Dr. Caryn BeeKevin Supple  5. Obstructive sleep apnea  PLAN:  1. He is cleared for upcoming left shoulder operation mother.supple 2. Excise treadmill test to establish baseline reference now 2 years post infarct 3. Discontinue Plavix 4. Liver and lipid panel 5. Clinical follow-up in one year assuming an Performance on the exercise treadmill.   SUBJECTIVE: Lawrence [name] is a 63 y.o. male who is doing well. He has been limited physically because of a bone spur in the left shoulder. The bone spurs prevented him from walking for exercise because of discomfort. He has a difficult time sleeping.  He denies dyspnea, chest discomfort, orthopnea, PND, palpitations. His been normal lower extremity swelling. No civic medication side effects. No bleeding on aspirin and Coumadin combination.   He has not had recent lipid panel performed.   Wt Readings from Last 3 Encounters:  11/29/13 233 lb (105.688 kg)  07/27/13 225 lb (102.059 kg)  02/22/13 233 lb (105.688 kg)     Past Medical History  Diagnosis Date  . Hypertension   . Bronchitis   . Myocardial infarct   . Hyperlipidemia LDL goal < 70   . Left ventricular aneurysm   . NSTEMI (non-ST elevated myocardial infarction)     Current Outpatient Prescriptions  Medication Sig Dispense Refill  . ALPRAZolam (XANAX) 0.25 MG tablet Take 1 tablet (0.25 mg total) by mouth at bedtime as needed for anxiety. 5 tablet 0  . Ascorbic Acid (VITAMIN  C) 1000 MG tablet Take 1,000 mg by mouth daily.    Marland Kitchen. aspirin EC 81 MG tablet Take 81 mg by mouth every morning.    Marland Kitchen. atorvastatin (LIPITOR) 80 MG tablet Take 0.5 tablets (40 mg total) by mouth daily at 6 PM. 15 tablet 2  . Cholecalciferol (VITAMIN D3) 1000 UNITS CAPS Take 1 capsule by mouth daily.     . clopidogrel (PLAVIX) 75 MG tablet Take 1 tablet (75 mg total) by mouth daily with breakfast. 30 tablet 7  . co-enzyme Q-10 50 MG capsule Take 50 mg by mouth every evening.     . fish oil-omega-3 fatty acids 1000 MG capsule Take 1 g by mouth daily.    . hydrochlorothiazide (MICROZIDE) 12.5 MG capsule TAKE ONE CAPSULE BY MOUTH EVERY DAY 30 capsule 6  . lisinopril (PRINIVIL,ZESTRIL) 10 MG tablet Take 10 mg by mouth every evening.    . metoprolol succinate (TOPROL-XL) 50 MG 24 hr tablet Take 1 tablet (50 mg total) by mouth every morning. 30 tablet 5  . nitroGLYCERIN (NITROSTAT) 0.4 MG SL tablet Place 1 tablet (0.4 mg total) under the tongue every 5 (five) minutes as needed for chest pain. 25 tablet 3   No current facility-administered medications for this visit.    Allergies:   No Known Allergies  Social History:  The patient  reports that he quit smoking about 22 months ago. His smoking use included Cigars. He does not have any smokeless tobacco history on file. He reports that  he does not drink alcohol or use illicit drugs.   ROS:  Please see the history of present illness.   No transient neurological complaints. Denies claudication.   All other systems reviewed and negative.   OBJECTIVE: VS:  BP 132/88 mmHg  Pulse 70  Ht 5\' 11"  (1.803 m)  Wt 233 lb (105.688 kg)  BMI 32.51 kg/m2 Well nourished, well developed, in no acute distress, obese  HEENT: normal Neck: JVD flat. Carotid bruit 2+  Cardiac:  normal S1, S2; RRR; no murmur Lungs:  clear to auscultation bilaterally, no wheezing, rhonchi or rales Abd: soft, nontender, no hepatomegaly Ext: Edema absent. Pulses 2+  Skin: warm and  dry Neuro:  CNs 2-12 intact, no focal abnormalities noted  EKG:  Normal sinus rhythm with anterior infarct and evidence of inferior infarct. No change in EKG compared to prior tracing from one year ago. This possibility that he has left anterior hemiblock causing some contribution to the poor R-wave progression.       Signed, Darci NeedleHenry W. B. Smith III, MD 11/29/2013 3:26 PM

## 2013-12-03 ENCOUNTER — Other Ambulatory Visit: Payer: PRIVATE HEALTH INSURANCE

## 2013-12-10 ENCOUNTER — Other Ambulatory Visit: Payer: PRIVATE HEALTH INSURANCE

## 2013-12-17 ENCOUNTER — Ambulatory Visit: Payer: PRIVATE HEALTH INSURANCE | Admitting: Physician Assistant

## 2013-12-17 ENCOUNTER — Other Ambulatory Visit: Payer: PRIVATE HEALTH INSURANCE

## 2013-12-17 DIAGNOSIS — I2510 Atherosclerotic heart disease of native coronary artery without angina pectoris: Secondary | ICD-10-CM

## 2013-12-17 NOTE — Addendum Note (Signed)
Addended by: Iona CoachBROWN, LAUREN W on: 12/17/2013 12:23 PM   Modules accepted: Orders

## 2013-12-17 NOTE — Progress Notes (Signed)
xercise Treadmill Test Pre-Exercise Testing Evaluation Rhythm: normal sinus  Rate: 69     Test  Exercise Tolerance Test Ordering MD: Verdis PrimeHenry Smith, MD  Interpreting MD: Jacolyn ReedyMichele Lenze, PA-C  Unique Test No: 1  Treadmill:  1  Indication for ETT: known ASHD  Contraindication to ETT: No   Stress Modality: exercise - treadmill  Cardiac Imaging Performed: non   Protocol: standard Bruce - maximal  Max BP:  229/112  Max MPHR (bpm):  159 85% MPR (bpm):  135  MPHR obtained (bpm):  142 % MPHR obtained:  89  Reached 85% MPHR (min:sec):  6:31 Total Exercise Time (min-sec):  7:00  Workload in METS:  8.5 Borg Scale: 13  Reason ETT Terminated:  hypertension    ST Segment Analysis At Rest: normal ST segments - no evidence of significant ST depression With Exercise: no evidence of significant ST depression  Other Information Arrhythmia:  No Angina during ETT:  absent (0) Quality of ETT:  diagnostic  ETT Interpretation:  normal - no evidence of ischemia by ST analysis  Comments: Patient didn't take any BP meds prior to coming. BP got too high. He could have walked longer but stopped after reaching 85% MPHR.  Recommendations: F/U Dr.Smith

## 2013-12-20 ENCOUNTER — Encounter (HOSPITAL_COMMUNITY): Payer: Self-pay | Admitting: Interventional Cardiology

## 2013-12-20 ENCOUNTER — Encounter (INDEPENDENT_AMBULATORY_CARE_PROVIDER_SITE_OTHER): Payer: 59 | Admitting: *Deleted

## 2013-12-20 DIAGNOSIS — E785 Hyperlipidemia, unspecified: Secondary | ICD-10-CM

## 2013-12-20 LAB — LIPID PANEL
Cholesterol: 174 mg/dL (ref 0–200)
HDL: 39.8 mg/dL (ref >39.00)
LDL Cholesterol: 112 mg/dL — ABNORMAL HIGH (ref 0–99)
NonHDL: 134.2
Total CHOL/HDL Ratio: 4
Triglycerides: 113 mg/dL (ref 0.0–149.0)
VLDL: 22.6 mg/dL (ref 0.0–40.0)

## 2013-12-20 LAB — ALT: ALT: 26 U/L (ref 0–53)

## 2013-12-21 ENCOUNTER — Telehealth: Payer: Self-pay

## 2013-12-21 DIAGNOSIS — E785 Hyperlipidemia, unspecified: Secondary | ICD-10-CM

## 2013-12-21 NOTE — Telephone Encounter (Signed)
-----   Message from Lyn RecordsHenry W Smith III, MD sent at 12/20/2013  2:45 PM EST ----- Lipids are much better. Continue to diet, exercise, and strive for weight loss. Repeat a hepatic and lipid panel in 6 months

## 2013-12-21 NOTE — Progress Notes (Signed)
This encounter was created in error - please disregard.

## 2013-12-26 ENCOUNTER — Encounter (HOSPITAL_COMMUNITY): Payer: PRIVATE HEALTH INSURANCE

## 2014-01-15 ENCOUNTER — Telehealth: Payer: Self-pay | Admitting: Interventional Cardiology

## 2014-01-15 ENCOUNTER — Telehealth: Payer: Self-pay

## 2014-01-15 NOTE — Telephone Encounter (Signed)
Cardiac clearance faxed to [facility] fax # [phone] attn: Wendy Caton 

## 2014-01-15 NOTE — Telephone Encounter (Signed)
New message    Did receive medical clearance - was not indicated to stop plavix or  asa prior to surgery .   Office called Patient  To see  -  was not given that information.

## 2014-01-18 NOTE — Telephone Encounter (Signed)
Will forward to Dr. Katrinka BlazingSmith to determine when pt can stop ASA and Plavix prior to surgery.

## 2014-01-22 NOTE — Telephone Encounter (Signed)
[facility] and that documentation will be faxed to the # she left.  Requested she call back if further questions or needs.

## 2014-01-22 NOTE — Telephone Encounter (Signed)
Plavix has been discontinued. Okay to stop aspirin 3-5 days prior to surgery

## 2014-01-23 ENCOUNTER — Ambulatory Visit (HOSPITAL_COMMUNITY)
Admission: RE | Admit: 2014-01-23 | Discharge: 2014-01-23 | Disposition: A | Discharge status: Home / Self Care | Payer: 59 | Source: Ambulatory Visit | Attending: Anesthesiology | Admitting: Anesthesiology

## 2014-01-23 ENCOUNTER — Encounter (HOSPITAL_COMMUNITY): Payer: Self-pay

## 2014-01-23 ENCOUNTER — Encounter (HOSPITAL_COMMUNITY)
Admission: RE | Admit: 2014-01-23 | Discharge: 2014-01-23 | Disposition: A | Discharge status: Home / Self Care | Payer: 59 | Source: Ambulatory Visit | Attending: Orthopedic Surgery | Admitting: Orthopedic Surgery

## 2014-01-23 DIAGNOSIS — Z01818 Encounter for other preprocedural examination: Secondary | ICD-10-CM

## 2014-01-23 DIAGNOSIS — R05 Cough: Secondary | ICD-10-CM | POA: Diagnosis not present

## 2014-01-23 DIAGNOSIS — G4733 Obstructive sleep apnea (adult) (pediatric): Secondary | ICD-10-CM | POA: Diagnosis not present

## 2014-01-23 DIAGNOSIS — E785 Hyperlipidemia, unspecified: Secondary | ICD-10-CM | POA: Insufficient documentation

## 2014-01-23 DIAGNOSIS — K219 Gastro-esophageal reflux disease without esophagitis: Secondary | ICD-10-CM | POA: Insufficient documentation

## 2014-01-23 DIAGNOSIS — I252 Old myocardial infarction: Secondary | ICD-10-CM | POA: Diagnosis not present

## 2014-01-23 DIAGNOSIS — I10 Essential (primary) hypertension: Secondary | ICD-10-CM | POA: Diagnosis not present

## 2014-01-23 DIAGNOSIS — Z01812 Encounter for preprocedural laboratory examination: Secondary | ICD-10-CM | POA: Insufficient documentation

## 2014-01-23 HISTORY — DX: Sleep apnea, unspecified: G47.30

## 2014-01-23 HISTORY — DX: Anxiety disorder, unspecified: F41.9

## 2014-01-23 HISTORY — DX: Gastro-esophageal reflux disease without esophagitis: K21.9

## 2014-01-23 HISTORY — DX: Unspecified osteoarthritis, unspecified site: M19.90

## 2014-01-23 LAB — COMPREHENSIVE METABOLIC PANEL
ALT: 24 U/L (ref 0–53)
AST: 26 U/L (ref 0–37)
Albumin: 4 g/dL (ref 3.5–5.2)
Alkaline Phosphatase: 66 U/L (ref 39–117)
Anion gap: 8 (ref 5–15)
BUN: 12 mg/dL (ref 6–23)
CO2: 27 mmol/L (ref 19–32)
Calcium: 9 mg/dL (ref 8.4–10.5)
Chloride: 106 mEq/L (ref 96–112)
Creatinine, Ser: 1.1 mg/dL (ref 0.50–1.35)
GFR calc Af Amer: 82 mL/min — ABNORMAL LOW (ref >90)
GFR calc non Af Amer: 71 mL/min — ABNORMAL LOW (ref >90)
Glucose, Bld: 102 mg/dL — ABNORMAL HIGH (ref 70–99)
Potassium: 4.1 mmol/L (ref 3.5–5.1)
Sodium: 141 mmol/L (ref 135–145)
Total Bilirubin: 0.7 mg/dL (ref 0.3–1.2)
Total Protein: 7 g/dL (ref 6.0–8.3)

## 2014-01-23 LAB — CBC WITH DIFFERENTIAL/PLATELET
Basophils Absolute: 0 10*3/uL (ref 0.0–0.1)
Basophils Relative: 1 % (ref 0–1)
Eosinophils Absolute: 0.1 10*3/uL (ref 0.0–0.7)
Eosinophils Relative: 2 % (ref 0–5)
HCT: 42.9 % (ref 39.0–52.0)
Hemoglobin: 15.1 g/dL (ref 13.0–17.0)
Lymphocytes Relative: 21 % (ref 12–46)
Lymphs Abs: 1.8 10*3/uL (ref 0.7–4.0)
MCH: 32.3 pg (ref 26.0–34.0)
MCHC: 35.2 g/dL (ref 30.0–36.0)
MCV: 91.9 fL (ref 78.0–100.0)
Monocytes Absolute: 0.7 10*3/uL (ref 0.1–1.0)
Monocytes Relative: 8 % (ref 3–12)
Neutro Abs: 5.9 10*3/uL (ref 1.7–7.7)
Neutrophils Relative %: 68 % (ref 43–77)
Platelets: 212 10*3/uL (ref 150–400)
RBC: 4.67 MIL/uL (ref 4.22–5.81)
RDW: 13.3 % (ref 11.5–15.5)
WBC: 8.6 10*3/uL (ref 4.0–10.5)

## 2014-01-23 LAB — PROTIME-INR
INR: 0.96 (ref 0.00–1.49)
Prothrombin Time: 12.9 seconds (ref 11.6–15.2)

## 2014-01-23 LAB — APTT: aPTT: 29 seconds (ref 24–37)

## 2014-01-23 NOTE — Progress Notes (Signed)
Dr Rennis ChrisSupple office made aware that orders were needed in Epic for patient's PAT appointment at 1300.

## 2014-01-23 NOTE — Pre-Procedure Instructions (Signed)
Lawrence BonnetCharles E [name]  01/23/2014   Your procedure is scheduled on:  Thursday  January 21,2016 at 1300 PM  [facility] at 1100 AM.  Call this number if you have problems the morning of surgery: [phone]   Remember:   Do not eat food or drink liquids after midnight Wednesday 01/30/14   Take these medicines the morning of surgery with A SIP OF WATER: Xanax if needed for anxiety, and Metoprolol   Plavix has been discontinued, okay to stop Aspirin 3-5 days prior to surgery per Dr Katrinka BlazingSmith. Stop Co-enzyme Q-10,and Fish oil, Nsaids and herbal meds 5 days prior to surgery.   Do not wear jewelry, make-up or nail polish.  Do not wear lotions, powders, or colognes. You may wear deodorant.  Men may shave face and neck.  Do not bring valuables to the hospital.  [facility] not responsible                  for any belongings or valuables.               Contacts, dentures or bridgework may not be worn into surgery.  Leave suitcase in the car. After surgery it may be brought to your room.  For patients admitted to the hospital, discharge time is determined by your                treatment team.               Patients discharged the day of surgery Lawrence not be allowed to drive  home.    Special Instructions:  - Preparing for Surgery  Before surgery, you can play an important role.  Because skin is not sterile, your skin needs to be as free of germs as possible.  You can reduce the number of germs on you skin by washing with CHG (chlorahexidine gluconate) soap before surgery.  CHG is an antiseptic cleaner which kills germs and bonds with the skin to continue killing germs even after washing.  Please DO NOT use if you have an allergy to CHG or antibacterial soaps.  If your skin becomes reddened/irritated stop using the CHG and inform your nurse when you arrive at Short Stay.  Do not shave (including legs and underarms) for at least 48 hours prior to the first CHG  shower.  You may shave your face.  Please follow these instructions carefully:   1.  Shower with CHG Soap the night before surgery and the                                morning of Surgery.  2.  If you choose to wash your hair, wash your hair first as usual with your       normal shampoo.  3.  After you shampoo, rinse your hair and body thoroughly to remove the                      Shampoo.  4.  Use CHG as you would any other liquid soap.  You can apply chg directly       to the skin and wash gently with scrungie or a clean washcloth.  5.  Apply the CHG Soap to your body ONLY FROM THE NECK DOWN.        Do not use on open wounds or open sores.  Avoid  contact with your eyes,       ears, mouth and genitals (private parts).  Wash genitals (private parts)       with your normal soap.  6.  Wash thoroughly, paying special attention to the area where your surgery        Lawrence be performed.  7.  Thoroughly rinse your body with warm water from the neck down.  8.  DO NOT shower/wash with your normal soap after using and rinsing off       the CHG Soap.  9.  Pat yourself dry with a clean towel.            10.  Wear clean pajamas.            11.  Place clean sheets on your bed the night of your first shower and do not        sleep with pets.  Day of Surgery  Do not apply any lotions/deoderants the morning of surgery.  Please wear clean clothes to the hospital/surgery center.      Please read over the following fact sheets that you were given: Pain Booklet, Coughing and Deep Breathing and Surgical Site Infection Prevention

## 2014-01-24 ENCOUNTER — Emergency Department (HOSPITAL_COMMUNITY)
Admission: EM | Admit: 2014-01-24 | Discharge: 2014-01-24 | Disposition: A | Discharge status: Home / Self Care | Payer: 59 | Attending: Emergency Medicine | Admitting: Emergency Medicine

## 2014-01-24 ENCOUNTER — Emergency Department (HOSPITAL_COMMUNITY): Payer: 59

## 2014-01-24 ENCOUNTER — Encounter (HOSPITAL_COMMUNITY): Payer: Self-pay | Admitting: *Deleted

## 2014-01-24 ENCOUNTER — Encounter (HOSPITAL_COMMUNITY): Payer: Self-pay | Admitting: Emergency Medicine

## 2014-01-24 DIAGNOSIS — F419 Anxiety disorder, unspecified: Secondary | ICD-10-CM | POA: Diagnosis not present

## 2014-01-24 DIAGNOSIS — Z9889 Other specified postprocedural states: Secondary | ICD-10-CM | POA: Diagnosis not present

## 2014-01-24 DIAGNOSIS — J189 Pneumonia, unspecified organism: Secondary | ICD-10-CM

## 2014-01-24 DIAGNOSIS — Z9861 Coronary angioplasty status: Secondary | ICD-10-CM | POA: Insufficient documentation

## 2014-01-24 DIAGNOSIS — Z8669 Personal history of other diseases of the nervous system and sense organs: Secondary | ICD-10-CM | POA: Insufficient documentation

## 2014-01-24 DIAGNOSIS — M160 Bilateral primary osteoarthritis of hip: Secondary | ICD-10-CM | POA: Insufficient documentation

## 2014-01-24 DIAGNOSIS — I10 Essential (primary) hypertension: Secondary | ICD-10-CM | POA: Diagnosis not present

## 2014-01-24 DIAGNOSIS — K219 Gastro-esophageal reflux disease without esophagitis: Secondary | ICD-10-CM | POA: Insufficient documentation

## 2014-01-24 DIAGNOSIS — Z87891 Personal history of nicotine dependence: Secondary | ICD-10-CM | POA: Insufficient documentation

## 2014-01-24 DIAGNOSIS — Z79899 Other long term (current) drug therapy: Secondary | ICD-10-CM | POA: Diagnosis not present

## 2014-01-24 DIAGNOSIS — Z7982 Long term (current) use of aspirin: Secondary | ICD-10-CM | POA: Diagnosis not present

## 2014-01-24 DIAGNOSIS — E785 Hyperlipidemia, unspecified: Secondary | ICD-10-CM | POA: Insufficient documentation

## 2014-01-24 DIAGNOSIS — J159 Unspecified bacterial pneumonia: Secondary | ICD-10-CM | POA: Diagnosis not present

## 2014-01-24 DIAGNOSIS — R05 Cough: Secondary | ICD-10-CM | POA: Diagnosis present

## 2014-01-24 DIAGNOSIS — I252 Old myocardial infarction: Secondary | ICD-10-CM | POA: Insufficient documentation

## 2014-01-24 DIAGNOSIS — Z7902 Long term (current) use of antithrombotics/antiplatelets: Secondary | ICD-10-CM | POA: Insufficient documentation

## 2014-01-24 DIAGNOSIS — R059 Cough, unspecified: Secondary | ICD-10-CM

## 2014-01-24 MED ORDER — AZITHROMYCIN 250 MG PO TABS
500.0000 mg | ORAL_TABLET | Freq: Once | ORAL | Status: AC
  Administered 2014-01-24: 500 mg via ORAL
  Filled 2014-01-24: qty 2

## 2014-01-24 MED ORDER — AZITHROMYCIN 250 MG PO TABS: 250.0000 mg | ORAL_TABLET | Freq: Every day | ORAL | Status: DC

## 2014-01-24 MED ORDER — HYDROCOD POLST-CHLORPHEN POLST 10-8 MG/5ML PO LQCR
5.0000 mL | Freq: Every evening | ORAL | Status: DC | PRN
Start: 2014-01-24 — End: 2014-07-08

## 2014-01-24 NOTE — Progress Notes (Signed)
Anesthesia Chart Review:  Pt is 64 year old male scheduled for L shoulder arthroscopy with subacromial decompression, possible distal clavicle resection, rotator cuff repair on 01/31/2014 with Dr. Rennis ChrisSupple.   PMH includes: HTN, NSTEMI (2014), LV aneurysm, hyperlipidemia, OSA, GERD.   Medications include: ASA. Pt is no longer taking plavix.   Preoperative labs reviewed.    EKG 11/29/2013: NSR. LAFB. Cannot rule out inferior infarct, age undetermined. Anterolateral infarct, age undetermined.   Exercise tolerance test 12/17/2013:  -normal - no evidence of ischemia by ST analysis  Cardiac cath 01/23/2012: 1. Acute coronary syndrome with total occlusion of the apical segment of the distal LAD.  2. Angioplasty was unsuccessful at recanalizing the apical segment of the LAD. Stenting and catheter-based thrombectomy was aborted because of technical and clinical issues including the small vessel caliber, distal location, and moderate proximal LAD disease.  3. Patent ramus, circumflex, and right coronary. Moderate proximal to mid LAD disease approximately 50% stenosed. It is still possible that the proximal disease was the culprit for the ACS and the distal lesion could be embolized thrombus.  4. Apical wall motion abnormality do to LAD occlusion  Echo 01/26/2012: - Left ventricle: Wall thickness was increased in a pattern of severe LVH. Systolic function was normal. The estimated ejection fraction was in the range of 50% to 55%. There is severe hypokinesis of the apical myocardium. - Left atrium: The atrium was mildly dilated.  Pt has cardiac clearance from Dr. Verdis PrimeHenry Smith in Epic note dated 11/29/2013.   If no changes, I anticipate pt can proceed with surgery as scheduled.   Rica Mastngela Demosthenes Virnig, FNP-BC [facility] Phone: [phone] 01/24/2014 3:56 PM

## 2014-01-24 NOTE — ED Provider Notes (Signed)
CSN: [id number]     Arrival date & time 01/24/14  2058 History  This chart was scribed for Lawrence Anis, PA-C with Lawrence Severin, MD by Lawrence [name], ED Scribe. This patient was seen in room WTR7/WTR7 and the patient's care was started at 11:17 PM.    Chief Complaint  Patient presents with  . Cough   The history is provided by the patient. No language interpreter was used.    HPI Comments: Lawrence [name] is a 64 y.o. male who presents to the Emergency Department complaining of productive cough and night sweats with onset 2-3 days ago. He reports associated mild sore throat, chills, and chest pain when coughing only. He is unsure about fever. He denies nasal congestion.   Past Medical History  Diagnosis Date  . Hypertension   . Bronchitis   . Myocardial infarct   . Hyperlipidemia LDL goal < 70   . Left ventricular aneurysm   . NSTEMI (non-ST elevated myocardial infarction)   . Sleep apnea   . Cough   . Anxiety   . GERD (gastroesophageal reflux disease)     OTC  . Arthritis     hips  . Blood dyscrasia     bruises easily   Past Surgical History  Procedure Laterality Date  . Coronary stent placement    . Left heart catheterization with coronary angiogram N/A 01/23/2012    Procedure: LEFT HEART CATHETERIZATION WITH CORONARY ANGIOGRAM;  Surgeon: [name], MD;  Location: [facility] CATH LAB;  Service: Cardiovascular;  Laterality: N/A;  . Cardiac catheterization    . Hernia repair Left     inguinal    No family history on file. History  Substance Use Topics  . Smoking status: Former Smoker    Types: Cigars    Quit date: 01/12/2012  . Smokeless tobacco: Never Used  . Alcohol Use: No    Review of Systems  Constitutional: Positive for chills and diaphoresis.  HENT: Positive for sore throat. Negative for congestion.   Respiratory: Positive for cough.   Cardiovascular: Positive for chest pain.      Allergies  Review of patient's allergies indicates no known  allergies.  Home Medications   Prior to Admission medications   Medication Sig Start Date End Date Taking? Authorizing Provider  acetaminophen (TYLENOL) 500 MG tablet Take 1,000 mg by mouth every 6 (six) hours as needed for moderate pain.   Yes Historical Provider, MD  ALPRAZolam (XANAX) 0.25 MG tablet Take 1 tablet (0.25 mg total) by mouth at bedtime as needed for anxiety. 02/22/13  Yes Scott Moishe Spice, PA-C  Ascorbic Acid (VITAMIN C) 1000 MG tablet Take 1,000 mg by mouth daily.   Yes Historical Provider, MD  aspirin EC 81 MG tablet Take 81 mg by mouth every morning.   Yes Historical Provider, MD  atorvastatin (LIPITOR) 80 MG tablet Take 0.5 tablets (40 mg total) by mouth daily at 6 PM. 08/28/13  Yes [name], MD  Cholecalciferol (VITAMIN D3) 1000 UNITS CAPS Take 1 capsule by mouth daily.    Yes Historical Provider, MD  co-enzyme Q-10 50 MG capsule Take 50 mg by mouth every evening.    Yes Historical Provider, MD  fish oil-omega-3 fatty acids 1000 MG capsule Take 1 g by mouth daily.   Yes Historical Provider, MD  hydrochlorothiazide (MICROZIDE) 12.5 MG capsule TAKE 1 CAPSULE BY MOUTH ONCE DAILY 11/29/13  Yes [name], MD  lisinopril (PRINIVIL,ZESTRIL) 10 MG tablet Take  10 mg by mouth every evening.   Yes Historical Provider, MD  metoprolol succinate (TOPROL-XL) 50 MG 24 hr tablet Take 1 tablet (50 mg total) by mouth every morning. 02/16/13  Yes Lesleigh NoeHenry W Smith III, MD  Multiple Vitamin (MULTIVITAMIN WITH MINERALS) TABS tablet Take 1 tablet by mouth daily.   Yes Historical Provider, MD  nitroGLYCERIN (NITROSTAT) 0.4 MG SL tablet Place 1 tablet (0.4 mg total) under the tongue every 5 (five) minutes as needed for chest pain. 02/24/13  Yes [name], PA-C  azithromycin (ZITHROMAX Z-PAK) 250 MG tablet Take 1 tablet (250 mg total) by mouth daily. 01/24/14   Lawrence [name] A Catalyna Reilly, PA-C  chlorpheniramine-HYDROcodone (TUSSIONEX PENNKINETIC ER) 10-8 MG/5ML LQCR Take 5 mLs by mouth at bedtime  as needed for cough. 01/24/14   [name], PA-C  clopidogrel (PLAVIX) 75 MG tablet Take 1 tablet (75 mg total) by mouth daily with breakfast. Patient not taking: Reported on 01/23/2014 11/10/13   Lyn RecordsHenry W Smith III, MD   BP 138/87 mmHg  Pulse 100  Temp(Src) 98.7 F (37.1 C) (Oral)  Resp 20  Ht 5\' 10"  (1.778 m)  Wt 238 lb (107.956 kg)  BMI 34.15 kg/m2  SpO2 96% Physical Exam  Constitutional: He is oriented to person, place, and time. He appears well-developed and well-nourished.  Well-appearing  HENT:  Head: Normocephalic and atraumatic.  Oropharynx erythematous, no exudate  Eyes: Conjunctivae are normal.  Neck: Normal range of motion. Neck supple.  Cardiovascular: Normal rate, regular rhythm and normal heart sounds.   No murmur heard. Pulmonary/Chest: Effort normal. No respiratory distress. He has no wheezes. He has rhonchi (scattered rhonchi bilaterally). He has no rales.  Full air movement  Musculoskeletal: Normal range of motion.  Neurological: He is alert and oriented to person, place, and time.  Skin: Skin is warm and dry.  Psychiatric: He has a normal mood and affect.  Nursing note and vitals reviewed.   ED Course  Procedures (including critical care time)  DIAGNOSTIC STUDIES: Oxygen Saturation is 96% on room air, normal by my interpretation.    COORDINATION OF CARE: 11:23 PM Discussed treatment plan with patient at beside, the patient agrees with the plan and has no further questions at this time.   Labs Review Labs Reviewed - No data to display  Imaging Review Dg Chest 2 View  01/24/2014   CLINICAL DATA:  Acute onset of cough for 2 days.  Initial encounter.  EXAM: CHEST  2 VIEW  COMPARISON:  Chest radiograph from 01/23/2014  FINDINGS: There is elevation of the right hemidiaphragm, stable from prior studies. Minimal left basilar opacity likely reflects atelectasis, though mild pneumonia could conceivably have a similar appearance. No pleural effusion or  pneumothorax is seen.  The heart is borderline normal in size. No acute osseous abnormalities are identified.  IMPRESSION: 1. Minimal left basilar airspace opacity likely reflects atelectasis, though mild pneumonia could conceivably have a similar appearance, depending on the patient's symptoms. 2. Chronic stable elevation of the right hemidiaphragm.   Electronically Signed   By: Roanna RaiderJeffery  Chang M.D.   On: 01/24/2014 21:47   Dg Chest 2 View  01/23/2014   CLINICAL DATA:  Preop for left shoulder surgery, nonproductive cough  EXAM: CHEST  2 VIEW  COMPARISON:  Chest x-ray of 07/27/2013  FINDINGS: No active infiltrate or effusion is seen. Chronic elevation of the right hemidiaphragm is stable. Mediastinal and hilar contours are unchanged. The heart is borderline enlarged. No bony abnormality is seen.  IMPRESSION: No active cardiopulmonary disease. Stable chronic elevation of the right hemidiaphragm.   Electronically Signed   By: Dwyane Dee M.D.   On: 01/23/2014 15:35     EKG Interpretation None      MDM   Final diagnoses:  CAP (community acquired pneumonia)    He has constellation of symptoms that correlate with change in CXR supporting pna dx. He is clinically well appearing with normal VS, no hypoxia. Will treat with abx, discharge home and PCP follow up for recheck.   I personally performed the services described in this documentation, which was scribed in my presence. The recorded information has been reviewed and is accurate.     Arnoldo Hooker, PA-C 01/27/14 2100  Olivia Mackie, MD 01/30/14 [id number]

## 2014-01-24 NOTE — ED Notes (Signed)
Pt states that he has had a productive cough for 2 days; pt states that it is clear phlegm that he is coughing up; pt denies difficulty breathing or shortness of breath; pt denies any other symptoms present at this time

## 2014-01-24 NOTE — Discharge Instructions (Signed)

## 2014-01-25 ENCOUNTER — Other Ambulatory Visit: Payer: Self-pay

## 2014-01-25 MED ORDER — ATORVASTATIN CALCIUM 80 MG PO TABS: 40.0000 mg | ORAL_TABLET | Freq: Every day | ORAL | Status: DC

## 2014-02-21 ENCOUNTER — Ambulatory Visit (HOSPITAL_COMMUNITY): Admission: RE | Admit: 2014-02-21 | Payer: 59 | Source: Ambulatory Visit | Admitting: Orthopedic Surgery

## 2014-02-21 ENCOUNTER — Encounter (HOSPITAL_COMMUNITY): Admission: RE | Payer: Self-pay | Source: Ambulatory Visit

## 2014-02-21 SURGERY — SHOULDER ARTHROSCOPY WITH SUBACROMIAL DECOMPRESSION, ROTATOR CUFF REPAIR AND BICEP TENDON REPAIR
Anesthesia: General | Laterality: Left

## 2014-03-25 ENCOUNTER — Other Ambulatory Visit: Payer: Self-pay | Admitting: Interventional Cardiology

## 2014-04-17 ENCOUNTER — Other Ambulatory Visit: Payer: Self-pay | Admitting: *Deleted

## 2014-04-17 MED ORDER — ATORVASTATIN CALCIUM 80 MG PO TABS: 40.0000 mg | ORAL_TABLET | Freq: Every day | ORAL | Status: DC

## 2014-04-22 ENCOUNTER — Other Ambulatory Visit: Payer: Self-pay | Admitting: *Deleted

## 2014-04-22 MED ORDER — ATORVASTATIN CALCIUM 80 MG PO TABS: 40.0000 mg | ORAL_TABLET | Freq: Every day | ORAL | Status: DC

## 2014-07-01 ENCOUNTER — Emergency Department (HOSPITAL_COMMUNITY): Payer: 59

## 2014-07-01 ENCOUNTER — Emergency Department (HOSPITAL_COMMUNITY)
Admission: EM | Admit: 2014-07-01 | Discharge: 2014-07-01 | Disposition: A | Discharge status: Home / Self Care | Payer: 59 | Attending: Emergency Medicine | Admitting: Emergency Medicine

## 2014-07-01 ENCOUNTER — Encounter (HOSPITAL_COMMUNITY): Payer: Self-pay

## 2014-07-01 DIAGNOSIS — E785 Hyperlipidemia, unspecified: Secondary | ICD-10-CM | POA: Diagnosis not present

## 2014-07-01 DIAGNOSIS — I252 Old myocardial infarction: Secondary | ICD-10-CM | POA: Diagnosis not present

## 2014-07-01 DIAGNOSIS — M1990 Unspecified osteoarthritis, unspecified site: Secondary | ICD-10-CM | POA: Diagnosis not present

## 2014-07-01 DIAGNOSIS — IMO0001 Reserved for inherently not codable concepts without codable children: Secondary | ICD-10-CM

## 2014-07-01 DIAGNOSIS — Z87891 Personal history of nicotine dependence: Secondary | ICD-10-CM | POA: Insufficient documentation

## 2014-07-01 DIAGNOSIS — I10 Essential (primary) hypertension: Secondary | ICD-10-CM | POA: Diagnosis not present

## 2014-07-01 DIAGNOSIS — Z7902 Long term (current) use of antithrombotics/antiplatelets: Secondary | ICD-10-CM | POA: Diagnosis not present

## 2014-07-01 DIAGNOSIS — R0602 Shortness of breath: Secondary | ICD-10-CM | POA: Diagnosis present

## 2014-07-01 DIAGNOSIS — Z7982 Long term (current) use of aspirin: Secondary | ICD-10-CM | POA: Diagnosis not present

## 2014-07-01 DIAGNOSIS — K219 Gastro-esophageal reflux disease without esophagitis: Secondary | ICD-10-CM | POA: Diagnosis not present

## 2014-07-01 DIAGNOSIS — Z79899 Other long term (current) drug therapy: Secondary | ICD-10-CM | POA: Diagnosis not present

## 2014-07-01 DIAGNOSIS — Z9861 Coronary angioplasty status: Secondary | ICD-10-CM | POA: Diagnosis not present

## 2014-07-01 LAB — I-STAT CHEM 8, ED
BUN: 18 mg/dL (ref 6–20)
Calcium, Ion: 1.17 mmol/L (ref 1.13–1.30)
Chloride: 102 mmol/L (ref 101–111)
Creatinine, Ser: 1.1 mg/dL (ref 0.61–1.24)
Glucose, Bld: 93 mg/dL (ref 65–99)
HCT: 45 % (ref 39.0–52.0)
Hemoglobin: 15.3 g/dL (ref 13.0–17.0)
Potassium: 4.5 mmol/L (ref 3.5–5.1)
Sodium: 139 mmol/L (ref 135–145)
TCO2: 25 mmol/L (ref 0–100)

## 2014-07-01 LAB — I-STAT TROPONIN, ED
Troponin i, poc: 0 ng/mL (ref 0.00–0.08)
Troponin i, poc: 0 ng/mL (ref 0.00–0.08)

## 2014-07-01 MED ORDER — FAMOTIDINE 20 MG PO TABS: 20.0000 mg | ORAL_TABLET | Freq: Two times a day (BID) | ORAL | Status: DC

## 2014-07-01 NOTE — ED Notes (Signed)
Pt reports acid reflux discomfort that began at 0700 this morning.  Pt reports he has a hx and is on medications for the same.  Pt does reports some nausea but denies any SOB, dizziness or diaphoresis.  Pt reports a burning sensation to midsternal chest.

## 2014-07-01 NOTE — Discharge Instructions (Signed)
Follow-up closely with your cardiologist and primary Dr. as reflux symptoms sometimes can be cardiac in origin. Return to the ER for worsening symptoms.  If you were given medicines take as directed.  If you are on coumadin or contraceptives realize their levels and effectiveness is altered by many different medicines.  If you have any reaction (rash, tongues swelling, other) to the medicines stop taking and see a physician.    If your blood pressure was elevated in the ER make sure you follow up for management with a primary doctor or return for chest pain, shortness of breath or stroke symptoms.  Please follow up as directed and return to the ER or see a physician for new or worsening symptoms.  Thank you. Filed Vitals:   07/01/14 1151  BP: 131/82  Pulse: 74  Temp: 98.4 F (36.9 C)  TempSrc: Oral  Resp: 26  Height: 5\' 11"  (1.803 m)  Weight: 225 lb (102.059 kg)  SpO2: 97%

## 2014-07-01 NOTE — ED Provider Notes (Signed)
CSN: [id number]     Arrival date & time 07/01/14  1139 History   First MD Initiated Contact with Patient 07/01/14 1227     Chief Complaint  Patient presents with  . Gastrophageal Reflux     (Consider location/radiation/quality/duration/timing/severity/associated sxs/prior Treatment) HPI Comments: 64 year old male with sleep apnea, heart attack, high blood pressure, lipids presents with burning reflux symptoms worsen the throats and upper chest that occurred at 7:00 this morning. Currently no symptoms. Patient had actual chest pain with his last heart issue. Patient currently has no chest pain no shortness of breath. Significant other states that he has appeared short of breath the past few weeks, patient denies that himself. No blood clot history, no recent surgeries, no fever or cough. No leg swelling or weight gain. No heart failure history. Currently patient denies any symptoms. Patient has sleep apnea. Patient normally takes acid medications for reflux. No gallbladder or ulcer history.  Patient is a 64 y.o. male presenting with GERD. The history is provided by the patient.  Gastrophageal Reflux Pertinent negatives include no chest pain, no abdominal pain, no headaches and no shortness of breath.    Past Medical History  Diagnosis Date  . Hypertension   . Bronchitis   . Myocardial infarct   . Hyperlipidemia LDL goal < 70   . Left ventricular aneurysm   . NSTEMI (non-ST elevated myocardial infarction)   . Sleep apnea   . Cough   . Anxiety   . GERD (gastroesophageal reflux disease)     OTC  . Arthritis     hips  . Blood dyscrasia     bruises easily   Past Surgical History  Procedure Laterality Date  . Coronary stent placement    . Left heart catheterization with coronary angiogram N/A 01/23/2012    Procedure: LEFT HEART CATHETERIZATION WITH CORONARY ANGIOGRAM;  Surgeon: [name], MD;  Location: [facility] CATH LAB;  Service: Cardiovascular;  Laterality: N/A;  . Cardiac  catheterization    . Hernia repair Left     inguinal    History reviewed. No pertinent family history. History  Substance Use Topics  . Smoking status: Former Smoker    Types: Cigars    Quit date: 01/12/2012  . Smokeless tobacco: Never Used  . Alcohol Use: No    Review of Systems  Constitutional: Negative for fever and chills.  HENT: Negative for congestion.   Eyes: Negative for visual disturbance.  Respiratory: Negative for shortness of breath.   Cardiovascular: Negative for chest pain.  Gastrointestinal: Negative for vomiting and abdominal pain.  Genitourinary: Negative for dysuria and flank pain.  Musculoskeletal: Negative for back pain, neck pain and neck stiffness.  Skin: Negative for rash.  Neurological: Negative for light-headedness and headaches.      Allergies  Review of patient's allergies indicates no known allergies.  Home Medications   Prior to Admission medications   Medication Sig Start Date End Date Taking? Authorizing Provider  acetaminophen (TYLENOL) 500 MG tablet Take 1,000 mg by mouth every 6 (six) hours as needed for moderate pain.   Yes Historical Provider, MD  Ascorbic Acid (VITAMIN C) 1000 MG tablet Take 1,000 mg by mouth daily.   Yes Historical Provider, MD  aspirin EC 81 MG tablet Take 81 mg by mouth every morning.   Yes Historical Provider, MD  atorvastatin (LIPITOR) 80 MG tablet Take 0.5 tablets (40 mg total) by mouth daily at 6 PM. 04/22/14  Yes Lyn Records, MD  Cholecalciferol (VITAMIN  D3) 1000 UNITS CAPS Take 4,000 Units by mouth daily.    Yes Historical Provider, MD  co-enzyme Q-10 50 MG capsule Take 50 mg by mouth every evening.    Yes Historical Provider, MD  fish oil-omega-3 fatty acids 1000 MG capsule Take 1 g by mouth daily.   Yes Historical Provider, MD  hydrochlorothiazide (MICROZIDE) 12.5 MG capsule TAKE 1 CAPSULE BY MOUTH ONCE DAILY 11/29/13  Yes Lyn Records, MD  lisinopril (PRINIVIL,ZESTRIL) 10 MG tablet Take 10 mg by mouth  every evening.   Yes Historical Provider, MD  metoprolol succinate (TOPROL-XL) 25 MG 24 hr tablet Take 25 mg by mouth daily.   Yes Historical Provider, MD  Multiple Vitamin (MULTIVITAMIN WITH MINERALS) TABS tablet Take 1 tablet by mouth daily.   Yes Historical Provider, MD  nitroGLYCERIN (NITROSTAT) 0.4 MG SL tablet Place 1 tablet (0.4 mg total) under the tongue every 5 (five) minutes as needed for chest pain. 02/24/13  Yes Brittainy Sherlynn Carbon, PA-C  ALPRAZolam Prudy Feeler) 0.25 MG tablet Take 1 tablet (0.25 mg total) by mouth at bedtime as needed for anxiety. Patient not taking: Reported on 07/01/2014 02/22/13   Beatrice Lecher, PA-C  azithromycin (ZITHROMAX Z-PAK) 250 MG tablet Take 1 tablet (250 mg total) by mouth daily. Patient not taking: Reported on 07/01/2014 01/24/14   [name], PA-C  chlorpheniramine-HYDROcodone (TUSSIONEX PENNKINETIC ER) 10-8 MG/5ML LQCR Take 5 mLs by mouth at bedtime as needed for cough. Patient not taking: Reported on 07/01/2014 01/24/14   [name], PA-C  clopidogrel (PLAVIX) 75 MG tablet Take 1 tablet (75 mg total) by mouth daily with breakfast. Patient not taking: Reported on 01/23/2014 11/10/13   Lyn Records, MD  famotidine (PEPCID) 20 MG tablet Take 1 tablet (20 mg total) by mouth 2 (two) times daily. 07/01/14   Blane Ohara, MD  metoprolol succinate (TOPROL-XL) 50 MG 24 hr tablet TAKE 1 TABLET (50 MG TOTAL) BY MOUTH EVERY MORNING. Patient not taking: Reported on 07/01/2014 03/26/14   Lyn Records, MD   BP 123/73 mmHg  Pulse 65  Temp(Src) 98.4 F (36.9 C) (Oral)  Resp 25  Ht 5\' 11"  (1.803 m)  Wt 225 lb (102.059 kg)  BMI 31.39 kg/m2  SpO2 98% Physical Exam  Constitutional: He is oriented to person, place, and time. He appears well-developed and well-nourished.  HENT:  Head: Normocephalic and atraumatic.  Eyes: Conjunctivae are normal. Right eye exhibits no discharge. Left eye exhibits no discharge.  Neck: Normal range of motion. Neck supple. No tracheal  deviation present.  Cardiovascular: Normal rate and regular rhythm.   Pulmonary/Chest: Effort normal and breath sounds normal.  Abdominal: Soft. He exhibits no distension. There is no tenderness. There is no guarding.  Musculoskeletal: He exhibits no edema.  Neurological: He is alert and oriented to person, place, and time.  Skin: Skin is warm. No rash noted.  Psychiatric: He has a normal mood and affect.  Nursing note and vitals reviewed.   ED Course  Procedures (including critical care time) Labs Review Labs Reviewed  I-STAT TROPOININ, ED  I-STAT CHEM 8, ED  I-STAT TROPOININ, ED    Imaging Review Dg Chest 2 View  07/01/2014   CLINICAL DATA:  Shortness of breath and intermittent chest pain for a few days.  EXAM: CHEST  2 VIEW  COMPARISON:  01/24/2014  FINDINGS: Cardiomediastinal silhouette is within normal limits. There is persistent elevation of the right hemidiaphragm with mild overlying atelectasis in the right lung base. Minimal left  basilar opacity on the prior study has essentially resolved. There is no evidence of acute airspace consolidation, edema, pleural effusion, or pneumothorax. No acute osseous abnormality is identified.  IMPRESSION: No active cardiopulmonary disease.   Electronically Signed   By: Sebastian Ache   On: 07/01/2014 13:44     EKG Interpretation   Date/Time:  Monday July 01 2014 11:50:02 EDT Ventricular Rate:  75 PR Interval:  186 QRS Duration: 92 QT Interval:  404 QTC Calculation: 451 R Axis:   -113 Text Interpretation:  Sinus rhythm Inferior infarct, old Consider  anterolateral infarct Similar to previous Confirmed by Danne Scardina  MD, Alexie Samson  ([id number]) on 07/01/2014 12:00:04 PM      MDM   Final diagnoses:  SOB (shortness of breath)  Reflux   Patient presents with clinical concern for acid reflux. No symptoms currently. Patient has medications at home for the same. With cardiac history plan for cardiac screen troponin EKG. EKG similar to previous.  Troponin negative. Patient has no symptoms on recheck is comfortable close outpatient follow-up as he may need a repeat stress test, last stress test was brushing one year ago. Patient and significant other comfortable with this plan.  Results and differential diagnosis were discussed with the patient/parent/guardian. Close follow up outpatient was discussed, comfortable with the plan.   Medications - No data to display  Filed Vitals:   07/01/14 1400 07/01/14 1415 07/01/14 1430 07/01/14 1441  BP: 121/68 124/79 130/74 123/73  Pulse: 71 69 73 65  Temp:      TempSrc:      Resp: Height:      Weight:      SpO2: 99% 98% 98% 98%    Final diagnoses:  SOB (shortness of breath)  Reflux       Blane Ohara, MD 07/01/14 1701

## 2014-07-08 ENCOUNTER — Encounter (HOSPITAL_COMMUNITY): Payer: Self-pay | Admitting: Emergency Medicine

## 2014-07-08 ENCOUNTER — Emergency Department (INDEPENDENT_AMBULATORY_CARE_PROVIDER_SITE_OTHER)
Admission: EM | Admit: 2014-07-08 | Discharge: 2014-07-08 | Disposition: A | Discharge status: Home / Self Care | Payer: 59 | Source: Home / Self Care | Attending: Family Medicine | Admitting: Family Medicine

## 2014-07-08 DIAGNOSIS — J302 Other seasonal allergic rhinitis: Secondary | ICD-10-CM

## 2014-07-08 MED ORDER — IPRATROPIUM BROMIDE 0.06 % NA SOLN: 2.0000 | Freq: Four times a day (QID) | NASAL | Status: DC

## 2014-07-08 MED ORDER — HYDROCOD POLST-CPM POLST ER 10-8 MG/5ML PO SUER: 5.0000 mL | Freq: Two times a day (BID) | ORAL | Status: DC | PRN

## 2014-07-08 NOTE — ED Notes (Deleted)
History of kidney stones.  Pain for 2 days, pain in low abdomen

## 2014-07-08 NOTE — Discharge Instructions (Signed)
Drink plenty of fluids as discussed, use medicine as prescribed, and mucinex or delsym for cough. see your doctor if further problems °

## 2014-07-08 NOTE — ED Provider Notes (Signed)
CSN: [id number]     Arrival date & time 07/08/14  1912 History   First MD Initiated Contact with Patient 07/08/14 1954     Chief Complaint  Patient presents with  . Cough   (Consider location/radiation/quality/duration/timing/severity/associated sxs/prior Treatment) Patient is a 64 y.o. male presenting with cough. The history is provided by the patient and the spouse.  Cough Cough characteristics:  Non-productive (occas clear phlegm., no fever or sob.) Severity:  Mild Onset quality:  Gradual Duration:  2 weeks Progression:  Unchanged Chronicity:  New Smoker: no   Context: upper respiratory infection and weather changes   Context: not sick contacts   Relieved by:  None tried Worsened by:  Nothing tried Ineffective treatments:  None tried Associated symptoms: rhinorrhea, sinus congestion and wheezing   Associated symptoms: no chest pain, no fever, no shortness of breath and no sore throat     Past Medical History  Diagnosis Date  . Hypertension   . Bronchitis   . Myocardial infarct   . Hyperlipidemia LDL goal < 70   . Left ventricular aneurysm   . NSTEMI (non-ST elevated myocardial infarction)   . Sleep apnea   . Cough   . Anxiety   . GERD (gastroesophageal reflux disease)     OTC  . Arthritis     hips  . Blood dyscrasia     bruises easily   Past Surgical History  Procedure Laterality Date  . Coronary stent placement    . Left heart catheterization with coronary angiogram N/A 01/23/2012    Procedure: LEFT HEART CATHETERIZATION WITH CORONARY ANGIOGRAM;  Surgeon: [name], MD;  Location: [facility] CATH LAB;  Service: Cardiovascular;  Laterality: N/A;  . Cardiac catheterization    . Hernia repair Left     inguinal    No family history on file. History  Substance Use Topics  . Smoking status: Former Smoker    Types: Cigars    Quit date: 01/12/2012  . Smokeless tobacco: Never Used  . Alcohol Use: No    Review of Systems  Constitutional: Negative.  Negative  for fever.  HENT: Positive for congestion, postnasal drip and rhinorrhea. Negative for sore throat.   Respiratory: Positive for cough and wheezing. Negative for shortness of breath.   Cardiovascular: Negative for chest pain.    Allergies  Review of patient's allergies indicates no known allergies.  Home Medications   Prior to Admission medications   Medication Sig Start Date End Date Taking? Authorizing Provider  acetaminophen (TYLENOL) 500 MG tablet Take 1,000 mg by mouth every 6 (six) hours as needed for moderate pain.    Historical Provider, MD  ALPRAZolam Prudy Feeler) 0.25 MG tablet Take 1 tablet (0.25 mg total) by mouth at bedtime as needed for anxiety. Patient not taking: Reported on 07/01/2014 02/22/13   Beatrice Lecher, PA-C  Ascorbic Acid (VITAMIN C) 1000 MG tablet Take 1,000 mg by mouth daily.    Historical Provider, MD  aspirin EC 81 MG tablet Take 81 mg by mouth every morning.    Historical Provider, MD  atorvastatin (LIPITOR) 80 MG tablet Take 0.5 tablets (40 mg total) by mouth daily at 6 PM. 04/22/14   Lyn Records, MD  azithromycin (ZITHROMAX Z-PAK) 250 MG tablet Take 1 tablet (250 mg total) by mouth daily. Patient not taking: Reported on 07/01/2014 01/24/14   [name], PA-C  chlorpheniramine-HYDROcodone [facility] PENNKINETIC ER) 10-8 MG/5ML SUER Take 5 mLs by mouth every 12 (twelve) hours as needed for cough.  07/08/14   Linna HoffJames D Buzz Axel, MD  Cholecalciferol (VITAMIN D3) 1000 UNITS CAPS Take 4,000 Units by mouth daily.     Historical Provider, MD  clopidogrel (PLAVIX) 75 MG tablet Take 1 tablet (75 mg total) by mouth daily with breakfast. Patient not taking: Reported on 01/23/2014 11/10/13   Lyn RecordsHenry W Smith, MD  co-enzyme Q-10 50 MG capsule Take 50 mg by mouth every evening.     Historical Provider, MD  famotidine (PEPCID) 20 MG tablet Take 1 tablet (20 mg total) by mouth 2 (two) times daily. 07/01/14   Blane OharaJoshua Zavitz, MD  fish oil-omega-3 fatty acids 1000 MG capsule Take 1 g by mouth  daily.    Historical Provider, MD  hydrochlorothiazide (MICROZIDE) 12.5 MG capsule TAKE 1 CAPSULE BY MOUTH ONCE DAILY 11/29/13   Lyn RecordsHenry W Smith, MD  ipratropium (ATROVENT) 0.06 % nasal spray Place 2 sprays into both nostrils 4 (four) times daily. 07/08/14   Linna HoffJames D Ellie Spickler, MD  lisinopril (PRINIVIL,ZESTRIL) 10 MG tablet Take 10 mg by mouth every evening.    Historical Provider, MD  metoprolol succinate (TOPROL-XL) 25 MG 24 hr tablet Take 25 mg by mouth daily.    Historical Provider, MD  metoprolol succinate (TOPROL-XL) 50 MG 24 hr tablet TAKE 1 TABLET (50 MG TOTAL) BY MOUTH EVERY MORNING. Patient not taking: Reported on 07/01/2014 03/26/14   Lyn RecordsHenry W Smith, MD  Multiple Vitamin (MULTIVITAMIN WITH MINERALS) TABS tablet Take 1 tablet by mouth daily.    Historical Provider, MD  nitroGLYCERIN (NITROSTAT) 0.4 MG SL tablet Place 1 tablet (0.4 mg total) under the tongue every 5 (five) minutes as needed for chest pain. 02/24/13   Brittainy M Simmons, PA-C   BP 125/89 mmHg  Pulse 80  Temp(Src) 98.4 F (36.9 C) (Oral)  Resp 12  SpO2 98% Physical Exam  Constitutional: He is oriented to person, place, and time. He appears well-developed and well-nourished. No distress.  HENT:  Right Ear: External ear normal.  Left Ear: External ear normal.  Mouth/Throat: Oropharynx is clear and moist.  Eyes: Pupils are equal, round, and reactive to light.  Neck: Normal range of motion. Neck supple.  Cardiovascular: Normal rate, normal heart sounds and intact distal pulses.   Pulmonary/Chest: Effort normal and breath sounds normal. He has no wheezes.  Lymphadenopathy:    He has no cervical adenopathy.  Neurological: He is alert and oriented to person, place, and time.  Skin: Skin is warm and dry.  Nursing note and vitals reviewed.   ED Course  Procedures (including critical care time) Labs Review Labs Reviewed - No data to display  Imaging Review No results found.   MDM   1. Seasonal allergic rhinitis         Linna HoffJames D Zian Mohamed, MD 07/08/14 2024

## 2014-07-08 NOTE — ED Notes (Signed)
Phlegm is clear, cough is keeping patient awake at night.  Patient had pneumonia in February 2016.  No fever.  Has clammy episodes.

## 2014-07-27 ENCOUNTER — Other Ambulatory Visit: Payer: Self-pay | Admitting: Interventional Cardiology

## 2014-08-06 ENCOUNTER — Other Ambulatory Visit: Payer: Self-pay | Admitting: Interventional Cardiology

## 2014-11-07 ENCOUNTER — Other Ambulatory Visit: Payer: Self-pay | Admitting: Interventional Cardiology

## 2014-12-05 ENCOUNTER — Other Ambulatory Visit: Payer: Self-pay | Admitting: Interventional Cardiology

## 2015-03-03 ENCOUNTER — Telehealth: Payer: Self-pay | Admitting: Interventional Cardiology

## 2015-03-03 NOTE — Telephone Encounter (Signed)
Follow Up   Wanted to make sure that this message was routed to [facility] nurse

## 2015-03-03 NOTE — Telephone Encounter (Signed)
Returned Lawrence [name] wife's call. She sts that Lawrence [name] has been complaining of feeling weak. They are currently at an urgent care waiting for Lawrence [name] to be evaluated. Lawrence [name] denies chest pain, sob, swelling, palpitations, nausea, vomiting. Lawrence [name] is heading out of town next week and is past due fort cardiology f/u. They are concerned and would like Lawrence [name] seen prior to leaving town next week. Lawrence [name] already has an appt scheduled with Lilian Coma for 2/22 @ 8:50am. Lawrence [name] does not have any cardiac complaints, only generalized weakness., adv Lawrence [name] to complete the eval by urgent care today. F/u as planned with Lilian Coma. Call the office tomorrow if cardiac symptoms develop, or if it is  determined that Lawrence [name] symptoms are cardiac related. Lawrence [name] and Lawrence [name] wife agreeable with plan and verbalized understanding

## 2015-03-03 NOTE — Telephone Encounter (Signed)
New messaage  Pt wife called staqtes that the pt is very week, pt was recently diagnosed with pnemonia. Pt wife is requesting a call back to discuss if he weakness is heart related and if he could be seen this week.  Made appt with PA Scott weaver for 03/05/2015 at 8:50p. Pt wife states that he is also going out of town next week and she really wants him checked before. Please call back to discuss.

## 2015-03-04 NOTE — Progress Notes (Signed)
Cardiology Office Note:    Date:  03/05/2015   ID:  Lawrence [name], DOB May 18, 1950, MRN [id number]  PCP:  Gwynneth Aliment, MD  Cardiologist:  Dr. Verdis Prime   Electrophysiologist:  n/a  Chief Complaint  Patient presents with  . Coronary Artery Disease    Follow up    History of Present Illness:     Lawrence [name] is a 65 y.o. male with a hx of CAD, status post non-STEMI (apical infarct) in 01/2012. Balloon angioplasty of the distal LAD was unsuccessful. He did have apical wall motion abnormality and was treated with anticoagulation for 6 weeks post MI. Other problems include sleep apnea, HTN, HL. Last seen by Dr. Katrinka Blazing 12/15.  Follow-up treadmill test was normal. Plavix was stopped.  Returns for FU.  Overall, doing well. The patient denies chest pain, shortness of breath, syncope, orthopnea, PND or significant pedal edema. Recently treated for pneumonia.  He is feeling much better.  He is going to [place] for vacation in the next week.  He gets nervous on the plane and would like me to Rx Xanax again for him (I did this 2 years ago).     Past Medical History  Diagnosis Date  . Hypertension   . CAD (coronary artery disease)     a. NSTEMI 1/14 - LHC: Ostial/proximal LAD 50%, distal LAD occluded near the apex with faint left to left collaterals, EF 50% with apical akinesis.;  b. Echo 1/14: Severe LVH, EF 50-55%, apical HK, mild LAE;  c. ETT-Myoview 3/14: EF 69%, apical scar, no ischemia;   d. ETT 12/15: no ST ischemic ST changes   . Hyperlipidemia LDL goal < 70   . Left ventricular aneurysm   . History of MI (myocardial infarction)   . Sleep apnea     on CPAP  . Anxiety   . GERD (gastroesophageal reflux disease)     OTC  . Arthritis     hips    Past Surgical History  Procedure Laterality Date  . Coronary stent placement    . Left heart catheterization with coronary angiogram N/A 01/23/2012    Procedure: LEFT HEART CATHETERIZATION WITH CORONARY ANGIOGRAM;  Surgeon: [name], MD;  Location: [facility] CATH LAB;  Service: Cardiovascular;  Laterality: N/A;  . Cardiac catheterization    . Hernia repair Left     inguinal     Current Medications: Outpatient Prescriptions Prior to Visit  Medication Sig Dispense Refill  . Ascorbic Acid (VITAMIN C) 1000 MG tablet Take 1,000 mg by mouth daily.    Marland Kitchen aspirin EC 81 MG tablet Take 81 mg by mouth every morning.    Marland Kitchen azithromycin (ZITHROMAX Z-PAK) 250 MG tablet Take 1 tablet (250 mg total) by mouth daily. 4 tablet 0  . Cholecalciferol (VITAMIN D3) 1000 UNITS CAPS Take 4,000 Units by mouth daily.     Marland Kitchen co-enzyme Q-10 50 MG capsule Take 50 mg by mouth every evening.     . fish oil-omega-3 fatty acids 1000 MG capsule Take 1 g by mouth daily.    . Multiple Vitamin (MULTIVITAMIN WITH MINERALS) TABS tablet Take 1 tablet by mouth daily.    Marland Kitchen ALPRAZolam (XANAX) 0.25 MG tablet Take 1 tablet (0.25 mg total) by mouth at bedtime as needed for anxiety. 5 tablet 0  . atorvastatin (LIPITOR) 80 MG tablet TAKE 1/2 TABLET EVERY DAY AT 6PM 15 tablet 3  . clopidogrel (PLAVIX) 75 MG tablet Take 1 tablet (75 mg  total) by mouth daily with breakfast. 30 tablet 7  . hydrochlorothiazide (MICROZIDE) 12.5 MG capsule TAKE ONE CAPSULE BY MOUTH EVERY DAY 30 capsule 3  . lisinopril (PRINIVIL,ZESTRIL) 10 MG tablet Take 10 mg by mouth every evening.    . nitroGLYCERIN (NITROSTAT) 0.4 MG SL tablet Place 1 tablet (0.4 mg total) under the tongue every 5 (five) minutes as needed for chest pain. 25 tablet 3  . acetaminophen (TYLENOL) 500 MG tablet Take 1,000 mg by mouth every 6 (six) hours as needed for moderate pain. Reported on 03/05/2015    . chlorpheniramine-HYDROcodone (TUSSIONEX PENNKINETIC ER) 10-8 MG/5ML SUER Take 5 mLs by mouth every 12 (twelve) hours as needed for cough. (Patient not taking: Reported on 03/05/2015) 140 mL 0  . famotidine (PEPCID) 20 MG tablet Take 1 tablet (20 mg total) by mouth 2 (two) times daily. (Patient not taking: Reported on  03/05/2015) 10 tablet 0  . ipratropium (ATROVENT) 0.06 % nasal spray Place 2 sprays into both nostrils 4 (four) times daily. (Patient not taking: Reported on 03/05/2015) 15 mL 1  . metoprolol succinate (TOPROL-XL) 25 MG 24 hr tablet Take 25 mg by mouth daily. Reported on 03/05/2015    . metoprolol succinate (TOPROL-XL) 50 MG 24 hr tablet TAKE 1 TABLET (50 MG TOTAL) BY MOUTH EVERY MORNING. (Patient not taking: Reported on 07/01/2014) 30 tablet 5   No facility-administered medications prior to visit.     Allergies:   Review of patient's allergies indicates no known allergies.   Social History   Social History  . Marital Status: Married    Spouse Name: N/A  . Number of Children: N/A  . Years of Education: N/A   Occupational History  . [employer] Director    Social History Main Topics  . Smoking status: Former Smoker    Types: Cigars    Quit date: 01/12/2012  . Smokeless tobacco: Never Used  . Alcohol Use: No  . Drug Use: No  . Sexual Activity: Not Asked   Other Topics Concern  . None   Social History Narrative     Family History:  The patient's family history is negative for Fainting, Heart attack, Heart disease, Heart failure, Hyperlipidemia, Hypertension, Anemia, Asthma, and Clotting disorder.   ROS:   Please see the history of present illness.    ROS All other systems reviewed and are negative.   Physical Exam:    VS:  BP 124/80 mmHg  Pulse 76  Ht 5\' 11"  (1.803 m)  Wt 231 lb 1.9 oz (104.835 kg)  BMI 32.25 kg/m2   GEN: Well nourished, well developed, in no acute distress HEENT: normal Neck: no JVD, no masses Cardiac: Normal S1/S2, RRR; no murmurs, no edema  Respiratory:  clear to auscultation bilaterally; no wheezing, rhonchi or rales GI: soft, nontender MS: no deformity or atrophy Skin: warm and dry Neuro: no focal deficits  Psych: Alert and oriented x 3, normal affect  Wt Readings from Last 3 Encounters:  03/05/15 231 lb 1.9 oz (104.835 kg)  07/01/14 225  lb (102.059 kg)  01/24/14 238 lb (107.956 kg)      Studies/Labs Reviewed:     EKG:  EKG is  ordered today.  The ekg ordered today demonstrates NSR, HR 76, LAD, Q waves in V3-V6, QTc 429 ms, no significant change when compared to prior tracings  Recent Labs: 07/01/2014: BUN 18; Creatinine, Ser 1.10; Hemoglobin 15.3; Potassium 4.5; Sodium 139   Recent Lipid Panel    Component Value Date/Time  CHOL 174 12/20/2013 0840   TRIG 113.0 12/20/2013 0840   HDL 39.80 12/20/2013 0840   CHOLHDL 4 12/20/2013 0840   VLDL 22.6 12/20/2013 0840   LDLCALC 112* 12/20/2013 0840    Additional studies/ records that were reviewed today include:   ETT 12/15 No evidence of ischemia by ST analysis  LHC (01/2012):  Ostial/proximal LAD 50%, distal LAD occluded near the apex with faint left to left collaterals, EF 50% with apical akinesis.  PCI: Balloon angioplasty of the distal LAD unsuccessful.  Echocardiogram (01/26/12):  Severe LVH, EF 50-55%, severe hypokinesis of the apical myocardium, mild LAE.  ETT-Myoview (03/2012):  Apical scar, no ischemia, EF 69%.   ASSESSMENT:     1. Coronary artery disease involving native coronary artery of native heart without angina pectoris   2. Essential hypertension   3. Hyperlipidemia   4. Obstructive sleep apnea   5. Anxiety     PLAN:     In order of problems listed above:  1. CAD - History of apical infarct with unsuccessful angioplasty of the distal LAD in 2014. Exercise treadmill test in 12/15 with no ischemic ST changes. He is now off of Plavix. Overall, doing well. Denies angina. Continue aspirin, statin, beta blocker.   2. HL - LDL in 12/15 was 112. Continue moderate intensity statin therapy. Arrange follow-up lipids, LFTs.    3. HTN -  Blood pressure is controlled. Continue current therapy. Arrange follow-up BMET.   4. OSA  - Continue CPAP.  5. Anxiety - He has anxiety with flying and I filled Xanax for him 2 years ago. He is leaving for a trip  to [place] in a couple days.  I have agreed to give him Xanax 0.25 mg QD prn anxiety, #5, no refills. Future refills should be obtained from PCP.     Medication Adjustments/Labs and Tests Ordered: Current medicines are reviewed at length with the patient today.  Concerns regarding medicines are outlined above.  Medication changes, Labs and Tests ordered today are outlined in the Patient Instructions noted below. Patient Instructions  Medication Instructions:  Your physician recommends that you continue on your current medications as directed. Please refer to the Current Medication list given to you today. We have sent in 90 day refills for your medications.  Labwork: WHEN YOU RETURN:  FASTING LIPID                                        LFT                                        BMET  Testing/Procedures: NONE ORDERED  Follow-Up: Your physician wants you to follow-up in: 1 YEAR WITH DR. Katrinka Blazing.  You will receive a reminder letter in the mail two months in advance. If you don't receive a letter, please call our office to schedule the follow-up appointment.  Any Other Special Instructions Will Be Listed Below (If Applicable).  If you need a refill on your cardiac medications before your next appointment, please call your pharmacy.   Signed, Tereso Newcomer, PA-C  03/05/2015 10:03 AM    [facility] [address] [place], [place], [place]  [address] Phone: [phone]; Fax: [phone]

## 2015-03-05 ENCOUNTER — Encounter: Payer: Self-pay | Admitting: Physician Assistant

## 2015-03-05 ENCOUNTER — Ambulatory Visit (INDEPENDENT_AMBULATORY_CARE_PROVIDER_SITE_OTHER): Payer: 59 | Admitting: Physician Assistant

## 2015-03-05 VITALS — BP 124/80 | HR 76 | Ht 71.0 in | Wt 231.1 lb

## 2015-03-05 DIAGNOSIS — E785 Hyperlipidemia, unspecified: Secondary | ICD-10-CM | POA: Diagnosis not present

## 2015-03-05 DIAGNOSIS — F419 Anxiety disorder, unspecified: Secondary | ICD-10-CM

## 2015-03-05 DIAGNOSIS — I10 Essential (primary) hypertension: Secondary | ICD-10-CM | POA: Diagnosis not present

## 2015-03-05 DIAGNOSIS — G4733 Obstructive sleep apnea (adult) (pediatric): Secondary | ICD-10-CM | POA: Diagnosis not present

## 2015-03-05 DIAGNOSIS — I2510 Atherosclerotic heart disease of native coronary artery without angina pectoris: Secondary | ICD-10-CM | POA: Diagnosis not present

## 2015-03-05 MED ORDER — ATORVASTATIN CALCIUM 80 MG PO TABS: ORAL_TABLET | ORAL | Status: DC

## 2015-03-05 MED ORDER — ALPRAZOLAM 0.25 MG PO TABS: 0.2500 mg | ORAL_TABLET | Freq: Every evening | ORAL | Status: DC | PRN

## 2015-03-05 MED ORDER — METOPROLOL SUCCINATE ER 50 MG PO TB24: ORAL_TABLET | ORAL | Status: DC

## 2015-03-05 MED ORDER — HYDROCHLOROTHIAZIDE 12.5 MG PO CAPS: 12.5000 mg | ORAL_CAPSULE | Freq: Every day | ORAL | Status: DC

## 2015-03-05 MED ORDER — LISINOPRIL 10 MG PO TABS: 10.0000 mg | ORAL_TABLET | Freq: Every evening | ORAL | Status: DC

## 2015-03-05 MED ORDER — NITROGLYCERIN 0.4 MG SL SUBL: 0.4000 mg | SUBLINGUAL_TABLET | SUBLINGUAL | Status: DC | PRN

## 2015-03-05 NOTE — Patient Instructions (Addendum)
Medication Instructions:  Your physician recommends that you continue on your current medications as directed. Please refer to the Current Medication list given to you today. We have sent in 90 day refills for your medications.  Labwork: WHEN YOU RETURN:  FASTING LIPID                                        LFT                                        BMET  Testing/Procedures: NONE ORDERED  Follow-Up: Your physician wants you to follow-up in: 1 YEAR WITH DR. Katrinka Blazing.  You will receive a reminder letter in the mail two months in advance. If you don't receive a letter, please call our office to schedule the follow-up appointment.  Any Other Special Instructions Will Be Listed Below (If Applicable).  If you need a refill on your cardiac medications before your next appointment, please call your pharmacy.

## 2015-03-17 ENCOUNTER — Ambulatory Visit: Payer: 59 | Admitting: Interventional Cardiology

## 2015-03-31 ENCOUNTER — Other Ambulatory Visit: Payer: Self-pay | Admitting: Interventional Cardiology

## 2015-06-24 ENCOUNTER — Ambulatory Visit (INDEPENDENT_AMBULATORY_CARE_PROVIDER_SITE_OTHER): Payer: 59 | Admitting: Pulmonary Disease

## 2015-06-24 ENCOUNTER — Encounter: Payer: Self-pay | Admitting: Pulmonary Disease

## 2015-06-24 VITALS — BP 124/82 | HR 68 | Ht 70.0 in | Wt 229.6 lb

## 2015-06-24 DIAGNOSIS — G4733 Obstructive sleep apnea (adult) (pediatric): Secondary | ICD-10-CM

## 2015-06-24 DIAGNOSIS — Z9989 Dependence on other enabling machines and devices: Principal | ICD-10-CM

## 2015-06-24 NOTE — Patient Instructions (Signed)
Will arrange for new CPAP machine Follow up in 1 year 

## 2015-06-24 NOTE — Progress Notes (Signed)
Current Outpatient Prescriptions on File Prior to Visit  Medication Sig  . ALPRAZolam (XANAX) 0.25 MG tablet Take 1 tablet (0.25 mg total) by mouth at bedtime as needed for anxiety.  . Ascorbic Acid (VITAMIN C) 1000 MG tablet Take 1,000 mg by mouth daily.  Marland Kitchen. aspirin EC 81 MG tablet Take 81 mg by mouth every morning.  Marland Kitchen. atorvastatin (LIPITOR) 80 MG tablet TAKE 1/2 TABLET EVERY DAY AT 6PM  . Cholecalciferol (VITAMIN D3) 1000 UNITS CAPS Take 4,000 Units by mouth daily.   Marland Kitchen. co-enzyme Q-10 50 MG capsule Take 50 mg by mouth every evening.   . fish oil-omega-3 fatty acids 1000 MG capsule Take 1 g by mouth daily.  . hydrochlorothiazide (MICROZIDE) 12.5 MG capsule Take 1 capsule (12.5 mg total) by mouth daily.  Marland Kitchen. lisinopril (PRINIVIL,ZESTRIL) 10 MG tablet Take 1 tablet (10 mg total) by mouth every evening.  . metoprolol succinate (TOPROL-XL) 50 MG 24 hr tablet TAKE 1/2  TABLET (25 MG TOTAL)  BY MOUTH ONCE DAILY  . Multiple Vitamin (MULTIVITAMIN WITH MINERALS) TABS tablet Take 1 tablet by mouth daily.  . nitroGLYCERIN (NITROSTAT) 0.4 MG SL tablet Place 1 tablet (0.4 mg total) under the tongue every 5 (five) minutes as needed for chest pain.  . predniSONE (STERAPRED UNI-PAK 21 TAB) 5 MG (21) TBPK tablet Take 5 mg by mouth daily. Reported on 06/24/2015   No current facility-administered medications on file prior to visit.     Chief Complaint  Patient presents with  . Follow-up    Needs new CPAP machine - current machine's motor is about to go out. Wears CPAP nightly but does not feel rested. DME: [facility].     Tests Echo 01/26/12 >> EF 50 to 55%  Past medical hx HTN, CAD, HLD, Anxiety, GERD, Arthritis  Past surgical hx, Allergies, Family hx, Social hx all reviewed.  Vital Signs BP 124/82 mmHg  Pulse 68  Ht 5\' 10"  (1.778 m)  Wt 229 lb 9.6 oz (104.146 kg)  BMI 32.94 kg/m2  SpO2 96%  History of Present Illness Lawrence BonnetCharles E Sabino is a 65 y.o. male with obstructive sleep apnea.  He has been using  CPAP.  He gets about 5.5 hrs with machine, but sleeps longer.  His machine is starting to wear out.  He has nasal mask > no issue with mask fit.  Physical Exam  General - No distress ENT - No sinus tenderness, no oral exudate, no LAN, MP 3 Cardiac - s1s2 regular, no murmur Chest - No wheeze/rales/dullness Back - No focal tenderness Abd - Soft, non-tender Ext - No edema Neuro - Normal strength Skin - No rashes Psych - normal mood, and behavior   Assessment/Plan  Obstructive sleep apnea. - Lawrence arrange for new CPAP machine at 8 cm H2O - explained he might need new sleep study for insurance coverage of supplies > he would be able to do home sleep study - encouraged him to use CPAP for entire time he is asleep   Patient Instructions  Lawrence arrange for new CPAP machine  Follow up in 1 year     Coralyn HellingVineet Darcella Shiffman, MD [facility] Pager:  [phone] 06/24/2015, 5:10 PM

## 2015-07-07 ENCOUNTER — Telehealth: Payer: Self-pay | Admitting: Pulmonary Disease

## 2015-07-07 DIAGNOSIS — G4733 Obstructive sleep apnea (adult) (pediatric): Secondary | ICD-10-CM

## 2015-07-07 NOTE — Telephone Encounter (Signed)
[facility] States that the pt will need a repeat sleep study to update his CPAP machine. Current sleep study is to old.  lmtcb x1 for pt.

## 2015-07-07 NOTE — Telephone Encounter (Signed)
Pt calling back [phone]

## 2015-07-07 NOTE — Telephone Encounter (Signed)
Spoke with pt. He would like to do a home sleep study. At his last OV, VS stated that this could be ordered if necessary. Order has been placed. Nothing further was needed.

## 2015-07-11 ENCOUNTER — Telehealth: Payer: Self-pay | Admitting: Pulmonary Disease

## 2015-07-11 NOTE — Telephone Encounter (Signed)
Called spoke with pt's wife. She states that according to our office we sent a precert on 07/07/15 and she was wanting to see if the insurance company had responded. I explained to her that I would need to [facility] progress and she is out of the office at this time. I explained to her that I would send a message to her to address on 07/14/15. She voiced understanding and had no further questions.   [facility] follow [facility].

## 2015-07-12 ENCOUNTER — Emergency Department (HOSPITAL_COMMUNITY): Payer: 59

## 2015-07-12 ENCOUNTER — Encounter (HOSPITAL_COMMUNITY): Payer: Self-pay | Admitting: Emergency Medicine

## 2015-07-12 ENCOUNTER — Emergency Department (HOSPITAL_COMMUNITY)
Admission: EM | Admit: 2015-07-12 | Discharge: 2015-07-12 | Disposition: A | Discharge status: Home / Self Care | Payer: 59 | Attending: Emergency Medicine | Admitting: Emergency Medicine

## 2015-07-12 DIAGNOSIS — H538 Other visual disturbances: Secondary | ICD-10-CM | POA: Diagnosis not present

## 2015-07-12 DIAGNOSIS — I252 Old myocardial infarction: Secondary | ICD-10-CM | POA: Diagnosis not present

## 2015-07-12 DIAGNOSIS — I10 Essential (primary) hypertension: Secondary | ICD-10-CM | POA: Insufficient documentation

## 2015-07-12 DIAGNOSIS — Z87891 Personal history of nicotine dependence: Secondary | ICD-10-CM | POA: Insufficient documentation

## 2015-07-12 DIAGNOSIS — E785 Hyperlipidemia, unspecified: Secondary | ICD-10-CM | POA: Insufficient documentation

## 2015-07-12 DIAGNOSIS — Z7982 Long term (current) use of aspirin: Secondary | ICD-10-CM | POA: Diagnosis not present

## 2015-07-12 DIAGNOSIS — Z79899 Other long term (current) drug therapy: Secondary | ICD-10-CM | POA: Diagnosis not present

## 2015-07-12 DIAGNOSIS — H539 Unspecified visual disturbance: Secondary | ICD-10-CM

## 2015-07-12 DIAGNOSIS — I2510 Atherosclerotic heart disease of native coronary artery without angina pectoris: Secondary | ICD-10-CM | POA: Diagnosis not present

## 2015-07-12 DIAGNOSIS — M1610 Unilateral primary osteoarthritis, unspecified hip: Secondary | ICD-10-CM | POA: Insufficient documentation

## 2015-07-12 LAB — CBC WITH DIFFERENTIAL/PLATELET
Basophils Absolute: 0 10*3/uL (ref 0.0–0.1)
Basophils Relative: 1 %
Eosinophils Absolute: 0.2 10*3/uL (ref 0.0–0.7)
Eosinophils Relative: 3 %
HCT: 41.8 % (ref 39.0–52.0)
Hemoglobin: 14.5 g/dL (ref 13.0–17.0)
Lymphocytes Relative: 46 %
Lymphs Abs: 3.1 10*3/uL (ref 0.7–4.0)
MCH: 32 pg (ref 26.0–34.0)
MCHC: 34.7 g/dL (ref 30.0–36.0)
MCV: 92.3 fL (ref 78.0–100.0)
Monocytes Absolute: 0.4 10*3/uL (ref 0.1–1.0)
Monocytes Relative: 7 %
Neutro Abs: 2.8 10*3/uL (ref 1.7–7.7)
Neutrophils Relative %: 43 %
Platelets: 218 10*3/uL (ref 150–400)
RBC: 4.53 MIL/uL (ref 4.22–5.81)
RDW: 13.2 % (ref 11.5–15.5)
WBC: 6.5 10*3/uL (ref 4.0–10.5)

## 2015-07-12 LAB — BASIC METABOLIC PANEL
Anion gap: 7 (ref 5–15)
BUN: 15 mg/dL (ref 6–20)
CO2: 26 mmol/L (ref 22–32)
Calcium: 9 mg/dL (ref 8.9–10.3)
Chloride: 106 mmol/L (ref 101–111)
Creatinine, Ser: 1.01 mg/dL (ref 0.61–1.24)
GFR calc Af Amer: >60 mL/min (ref >60)
GFR calc non Af Amer: >60 mL/min (ref >60)
Glucose, Bld: 95 mg/dL (ref 65–99)
Potassium: 3.9 mmol/L (ref 3.5–5.1)
Sodium: 139 mmol/L (ref 135–145)

## 2015-07-12 NOTE — Discharge Instructions (Signed)
You are given referral to neurologist to discuss further workup of your transient blurry vision. Please call if you do not hear from them on Monday or Tuesday. You are also given referral for ophthalmology for dedicated eye exam. Return without fail for recurrent symptoms or any new symptoms including difficulty speaking, difficulty walking, numbness or weakness on one side, or any other symptoms concerning to you.  Blurred Vision Having blurred vision means that you cannot see things clearly. Your vision may seem fuzzy or out of focus. Blurred vision is a very common symptom of an eye or vision problem. Blurred vision is often a gradual blur that occurs in one eye or both eyes. There are many causes of blurred vision, including cataracts, macular degeneration, and diabetic retinopathy. Blurred vision can be diagnosed based on your symptoms and a physical exam. Tell your health care provider about any other health problems you have, any recent eye injury, and any prior surgeries. You may need to see a health care provider who specializes in eye problems (ophthalmologist). Your treatment depends on what is causing your blurred vision.  HOME CARE INSTRUCTIONS  Tell your health care provider about any changes in your blurred vision.  Do not drive or operate heavy machinery if your vision is blurry.  Keep all follow-up visits as directed by your health care provider. This is important. SEEK MEDICAL CARE IF:  Your symptoms get worse.  You have new symptoms.  You have trouble seeing at night.  You have trouble seeing up close or far away.  You have trouble noticing the difference between colors. SEEK IMMEDIATE MEDICAL CARE IF:  You have severe eye pain.  You have a severe headache.  You have flashing lights in your field of vision.  You have a sudden change in vision.  You have a sudden loss of vision.  You have vision change after an injury.  You notice drainage coming from your  eyes.  You notice a rash around your eyes.   This information is not intended to replace advice given to you by your health care provider. Make sure you discuss any questions you have with your health care provider.   Document Released: 12/31/2002 Document Revised: 05/14/2014 Document Reviewed: 11/21/2013 Elsevier Interactive Patient Education [employer]2016 [employer]

## 2015-07-12 NOTE — ED Provider Notes (Signed)
CSN: [id number]     Arrival date & time 07/12/15  1536 History   First MD Initiated Contact with Patient 07/12/15 1553     Chief Complaint  Patient presents with  . Blurred Vision     (Consider location/radiation/quality/duration/timing/severity/associated sxs/prior Treatment) HPI  65 year old male who presents with blurry vision, now resolved. History of CAD, HTN, and HLD. States being in his usual state of health. This afternoon at around 11-12PM he noticed blurry vision from the right eye. States it lasted for an hour and went away on it's own. Denies any associating headache, diplopia, dysphasia, focal numbness or weakness, slurred speech or word finding difficulties, difficulty walking, vomiting, chest pain or shortness of breath, palpitations, syncope or near syncope. This is never happened to him before. Currently states that all of his symptoms are resolved.  Past Medical History  Diagnosis Date  . Hypertension   . CAD (coronary artery disease)     a. NSTEMI 1/14 - LHC: Ostial/proximal LAD 50%, distal LAD occluded near the apex with faint left to left collaterals, EF 50% with apical akinesis.;  b. Echo 1/14: Severe LVH, EF 50-55%, apical HK, mild LAE;  c. ETT-Myoview 3/14: EF 69%, apical scar, no ischemia;   d. ETT 12/15: no ST ischemic ST changes   . Hyperlipidemia LDL goal < 70   . Left ventricular aneurysm   . History of MI (myocardial infarction)   . Sleep apnea     on CPAP  . Anxiety   . GERD (gastroesophageal reflux disease)     OTC  . Arthritis     hips   Past Surgical History  Procedure Laterality Date  . Coronary stent placement    . Left heart catheterization with coronary angiogram N/A 01/23/2012    Procedure: LEFT HEART CATHETERIZATION WITH CORONARY ANGIOGRAM;  Surgeon: [name], MD;  Location: [facility] CATH LAB;  Service: Cardiovascular;  Laterality: N/A;  . Cardiac catheterization    . Hernia repair Left     inguinal    Family History  Problem Relation  Age of Onset  . Fainting Neg Hx   . Heart attack Neg Hx   . Heart disease Neg Hx   . Heart failure Neg Hx   . Hyperlipidemia Neg Hx   . Hypertension Neg Hx   . Anemia Neg Hx   . Asthma Neg Hx   . Clotting disorder Neg Hx    Social History  Substance Use Topics  . Smoking status: Former Smoker    Types: Cigars    Quit date: 01/12/2012  . Smokeless tobacco: Never Used  . Alcohol Use: No    Review of Systems 10/14 systems reviewed and are negative other than those stated in the HPI    Allergies  Review of patient's allergies indicates no known allergies.  Home Medications   Prior to Admission medications   Medication Sig Start Date End Date Taking? Authorizing Provider  ALPRAZolam (XANAX) 0.25 MG tablet Take 1 tablet (0.25 mg total) by mouth at bedtime as needed for anxiety. 03/05/15  Yes Beatrice Lecher, PA-C  Ascorbic Acid (VITAMIN C) 1000 MG tablet Take 1,000 mg by mouth daily.   Yes Historical Provider, MD  aspirin EC 81 MG tablet Take 81 mg by mouth every morning.   Yes Historical Provider, MD  atorvastatin (LIPITOR) 80 MG tablet TAKE 1/2 TABLET EVERY DAY AT 6PM 03/05/15  Yes Beatrice Lecher, PA-C  Cholecalciferol (VITAMIN D3) 1000 UNITS CAPS Take  4,000 Units by mouth daily.    Yes Historical Provider, MD  co-enzyme Q-10 50 MG capsule Take 50 mg by mouth every evening.    Yes Historical Provider, MD  fish oil-omega-3 fatty acids 1000 MG capsule Take 1 g by mouth daily.   Yes Historical Provider, MD  hydrochlorothiazide (MICROZIDE) 12.5 MG capsule Take 1 capsule (12.5 mg total) by mouth daily. 03/05/15  Yes Beatrice LecherScott T Weaver, PA-C  lisinopril (PRINIVIL,ZESTRIL) 10 MG tablet Take 1 tablet (10 mg total) by mouth every evening. 03/05/15  Yes Beatrice LecherScott T Weaver, PA-C  metoprolol succinate (TOPROL-XL) 50 MG 24 hr tablet TAKE 1/2  TABLET (25 MG TOTAL)  BY MOUTH ONCE DAILY 03/05/15  Yes Beatrice LecherScott T Weaver, PA-C  Multiple Vitamin (MULTIVITAMIN WITH MINERALS) TABS tablet Take 1 tablet by mouth daily.    Yes Historical Provider, MD  nitroGLYCERIN (NITROSTAT) 0.4 MG SL tablet Place 1 tablet (0.4 mg total) under the tongue every 5 (five) minutes as needed for chest pain. 03/05/15  Yes Scott T Weaver, PA-C   BP 150/99 mmHg  Pulse 58  Temp(Src) 98 F (36.7 C) (Oral)  Resp 21  Ht 5\' 10"  (1.778 m)  Wt 229 lb (103.874 kg)  BMI 32.86 kg/m2  SpO2 97% Physical Exam Physical Exam  Nursing note and vitals reviewed. Constitutional: Well developed, well nourished, non-toxic, and in no acute distress Head: Normocephalic and atraumatic.  Mouth/Throat: Oropharynx is clear and moist.  Neck: Normal range of motion. Neck supple.  Cardiovascular: Normal rate and regular rhythm.   Pulmonary/Chest: Effort normal and breath sounds normal.  Abdominal: Soft. There is no tenderness. There is no rebound and no guarding.  Musculoskeletal: Normal range of motion.  Skin: Skin is warm and dry.  Psychiatric: Cooperative Neurological:  Alert, oriented to person, place, time, and situation. Memory grossly in tact. Fluent speech. No dysarthria or aphasia.  Cranial nerves: VF are full. Fundoscopic exam normal bilaterally-no papilledema. Pupils are symmetric, and reactive to light. EOMI without nystagmus. No gaze deviation. Facial muscles symmetric with activation. Sensation to light touch over face in tact bilaterally. Hearing grossly in tact. Palate elevates symmetrically. Head turn and shoulder shrug are intact. Tongue midline.  Reflexes defered.  Muscle bulk and tone normal. No pronator drift. Moves all extremities symmetrically. Sensation to light touch is in tact throughout in bilateral upper and lower extremities. Coordination reveals no dysmetria with finger to nose. Gait is narrow-based and steady. Non-ataxic.   ED Course  Procedures (including critical care time) Labs Review Labs Reviewed  CBC WITH DIFFERENTIAL/PLATELET  BASIC METABOLIC PANEL    Imaging Review Ct Head Wo Contrast  07/12/2015   CLINICAL DATA:  Nausea and blurred vision beginning this morning. Hypertension. EXAM: CT HEAD WITHOUT CONTRAST TECHNIQUE: Contiguous axial images were obtained from the base of the skull through the vertex without intravenous contrast. COMPARISON:  05/14/2009 FINDINGS: No evidence of intracranial hemorrhage, brain edema, or other signs of acute infarction. No evidence of intracranial mass lesion or mass effect. No abnormal extraaxial fluid collections identified. Ventricles are normal in size. No skull abnormality identified. Increased mucosal thickening seen involving the left sphenoid sinus since 2011 exam. Ethmoid mucosal thickening shows no significant change. IMPRESSION: No acute intracranial abnormality. Chronic ethmoid and sphenoid sinusitis incidentally noted. Electronically Signed   By: Myles RosenthalJohn  Stahl M.D.   On: 07/12/2015 16:56   I have personally reviewed and evaluated these images and lab results as part of my medical decision-making.   EKG Interpretation   Date/Time:  Saturday July 12 2015 16:47:59 EDT Ventricular Rate:  59 PR Interval:    QRS Duration: 94 QT Interval:  413 QTC Calculation: 410 R Axis:   -73 Text Interpretation:  Sinus rhythm Left anterior fascicular block Low  voltage, precordial leads Consider anterior infarct No significant change  since last tracing Confirmed by Deeana Atwater MD, Railey Glad ([id number]) on 07/12/2015 4:59:24  PM      MDM   Final diagnoses:  Monocular visual disturbance  Blurry vision    65 year old male who presents with transient right blurry vision, which is now resolved. He has a normal neurological exam on presentation. No papilledema noted on endoscopy. Unremarkable EKG, basic blood work, and CT head. ? embolic phenomenon given his risk factors. Discussed with Dr. Ricki RodriguezEsharghi from neurology. Given resolved symptoms, he did not feel he required further imaging or further work-up. He recommended neurology referral for potential echo and carotid ultrasounds. The  patient is also given referral to ophthalmology along with our neurology for outpatient follow-up. Did discuss strict return instructions for recurrent symptoms or any new neurological symptoms. Strict return and follow-up instructions reviewed. He expressed understanding of all discharge instructions and felt comfortable with the plan of care.    Lavera Guiseana Duo Memphis Decoteau, MD 07/12/15 [id number]

## 2015-07-12 NOTE — ED Notes (Signed)
Pt from home with complaints of blurred vision that is worse in his right eye than his left eye. Pt also states he has nausea that was gradual in onset that began this morning. Pt states that the blurred vision was gradual onset as well. Pt states he has a history of hypertension and is hypertensive at time of assessment at 143/101. Aside from the vision changes, pt has no neuro deficits and pt is able to ambulate without difficulty

## 2015-07-12 NOTE — ED Notes (Signed)
Pt states 1hr ago he had blurry vision in his right which has since resolved. Pt denies vision changes in eye bilaterally at this time. Pt denies HA.

## 2015-07-14 NOTE — Telephone Encounter (Signed)
Spoke to [employer] HST not authorized yet forms given to dr sood to fill out that need to be faxed wife aware when Berkley Harveyauth is received we will call pt and set up appt to have him come pick of machine for study Tobe SosSally E Ottinger

## 2015-08-15 DIAGNOSIS — G4733 Obstructive sleep apnea (adult) (pediatric): Secondary | ICD-10-CM | POA: Diagnosis not present

## 2015-08-20 ENCOUNTER — Telehealth: Payer: Self-pay | Admitting: Pulmonary Disease

## 2015-08-20 DIAGNOSIS — Z9989 Dependence on other enabling machines and devices: Principal | ICD-10-CM

## 2015-08-20 DIAGNOSIS — G4733 Obstructive sleep apnea (adult) (pediatric): Secondary | ICD-10-CM

## 2015-08-20 NOTE — Telephone Encounter (Signed)
HST 08/15/15 >> AHI 16.8, SaO2 low 83%.  Will have my nurse inform pt that sleep study shows moderate sleep apnea.  Please inform patient that I have sent order to arrange for new CPAP machine.

## 2015-08-20 NOTE — Telephone Encounter (Signed)
Patient notified of Dr. Sood's recommendations. Nothing further needed.  

## 2015-08-21 DIAGNOSIS — G4733 Obstructive sleep apnea (adult) (pediatric): Secondary | ICD-10-CM

## 2015-08-22 ENCOUNTER — Other Ambulatory Visit: Payer: Self-pay | Admitting: *Deleted

## 2015-08-22 DIAGNOSIS — G4733 Obstructive sleep apnea (adult) (pediatric): Secondary | ICD-10-CM

## 2015-09-02 ENCOUNTER — Telehealth: Payer: Self-pay | Admitting: Pulmonary Disease

## 2015-09-02 NOTE — Telephone Encounter (Signed)
Called and spoke with pts wife and she stated that the pt is set up with [facility] for tomorrow to be fitted with the mask for the cpap.  They advised the pt to have [facility] to [employer] [facility] at [phone]---they use [employer] and [facility] advised them they do all the billing for [employer] and the order will have to go through [facility] so [facility] will know what the part the pt will have to pay.  [facility]. thanks

## 2015-09-02 NOTE — Telephone Encounter (Signed)
Melissa called back to advise she had spoken with pt's spouse.  Melissa was advised by the spouse that pt's 3rd party biller [employer] [facility] will not release benefit info to [facility].  Pt was needing info regarding the type of CPAP he will be getting so he can call [employer] to obtain his benefit info.  Melissa states she provided this info to patient's wife. Nothing further needed.

## 2015-09-02 NOTE — Telephone Encounter (Addendum)
I called & spoke with Lawrence NewcomerMelissa [name] @[facility]  concerning this since they get the authorizations and benefit info prior to scheduling pt an appt with their RT.  Lawrence [facility] and was advised that [employer] is needing the claim form.   This would be filed once the patient has had his appt with the RT. Lawrence [name] is going to contact patient/spouse and advise them of this.

## 2015-10-07 ENCOUNTER — Telehealth: Payer: Self-pay

## 2015-10-07 NOTE — Telephone Encounter (Signed)
Cardiac clearance placed in MR nurse fax box to be faxed to [facility] 

## 2016-01-12 HISTORY — PX: TOTAL HIP ARTHROPLASTY: SHX124

## 2016-01-13 ENCOUNTER — Other Ambulatory Visit: Payer: Self-pay | Admitting: Physician Assistant

## 2016-02-10 ENCOUNTER — Other Ambulatory Visit: Payer: Self-pay | Admitting: Physician Assistant

## 2016-02-19 ENCOUNTER — Encounter: Payer: Self-pay | Admitting: Physician Assistant

## 2016-02-22 NOTE — Progress Notes (Signed)
Cardiology Office Note    Date:  02/23/2016   ID:  Lawrence [name], DOB [birth year], MRN [id number]  PCP:  Lawrence Aliment, MD  Cardiologist:  Dr. Katrinka [name]   CC: cardiac clearance - hip replacement  History of Present Illness:  Lawrence [name] is a 66 y.o. male with a history of CAD, s/p NSTEMI (apical infarct) in 01/2012 s/p unsuccessful POBA of the distal LAD, subsequent apical wall motion abnormality and was treated with anticoagulation for 6 weeks post MI, OSA on CPAP, HTN, and HLD who presents to clinic for pre op clearance prior to hip arthroplasty.   Last seen by Dr. Katrinka [name] 12/15.  Follow-up treadmill test was normal. Plavix was stopped. He had done quite well since and was last seen by Lawrence Newcomer PA-C in 02/2015.   Today he presents to clinic for follow up. Upon review of notes, Dr. Katrinka [name] had already cleared Mr. Lawrence [name] for surgery back in 09/2015. 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 He has not has been as physically active due hip pain. Surgery going to be done at [name] in first of march. Wants to get back to playing tennis. No exertional chest pain or SOB. No LE edema , orthopnea or PND. No pain in legs with walking. No dizziness or syncope. No palpitations.    Past Medical History:  Diagnosis Date  . Anxiety   . Arthritis    hips  . CAD (coronary artery disease)    a. NSTEMI 1/14 - LHC: Ostial/proximal LAD 50%, distal LAD occluded near the apex with faint left to left collaterals, EF 50% with apical akinesis.;  b. Echo 1/14: Severe LVH, EF 50-55%, apical HK, mild LAE;  c. ETT-Myoview 3/14: EF 69%, apical scar, no ischemia;   d. ETT 12/15: no ST ischemic ST changes   . GERD (gastroesophageal reflux disease)    OTC  . History of MI (myocardial infarction)   . Hyperlipidemia LDL goal < 70   . Hypertension   . Left ventricular aneurysm   . Sleep apnea    on CPAP    Past Surgical History:  Procedure Laterality Date  . CARDIAC CATHETERIZATION    . CORONARY STENT  PLACEMENT    . HERNIA REPAIR Left    inguinal   . LEFT HEART CATHETERIZATION WITH CORONARY ANGIOGRAM N/A 01/23/2012   Procedure: LEFT HEART CATHETERIZATION WITH CORONARY ANGIOGRAM;  Surgeon: Lawrence Noe, MD;  Location: [facility] CATH LAB;  Service: Cardiovascular;  Laterality: N/A;    Current Medications: Outpatient Medications Prior to Visit  Medication Sig Dispense Refill  . ALPRAZolam (XANAX) 0.25 MG tablet Take 1 tablet (0.25 mg total) by mouth at bedtime as needed for anxiety. 5 tablet 0  . Ascorbic Acid (VITAMIN C) 1000 MG tablet Take 1,000 mg by mouth daily.    Marland Kitchen aspirin EC 81 MG tablet Take 81 mg by mouth every morning.    Marland Kitchen atorvastatin (LIPITOR) 80 MG tablet TAKE 1/2 TABLET EVERY DAY AT 6PM 90 tablet 3  . Cholecalciferol (VITAMIN D3) 1000 UNITS CAPS Take 4,000 Units by mouth daily.     Marland Kitchen co-enzyme Q-10 50 MG capsule Take 50 mg by mouth every evening.     . fish oil-omega-3 fatty acids 1000 MG capsule Take 1 g by mouth daily.    . hydrochlorothiazide (MICROZIDE) 12.5 MG capsule Take 1 capsule (12.5 mg total) by mouth daily. 90 capsule 3  . lisinopril (PRINIVIL,ZESTRIL) 10 MG tablet Take 1  tablet (10 mg total) by mouth every evening. *Please call an schedule a one year follow up appointment* 30 tablet 0  . metoprolol succinate (TOPROL-XL) 50 MG 24 hr tablet TAKE 1/2  TABLET (25 MG TOTAL)  BY MOUTH ONCE DAILY 45 tablet 3  . Multiple Vitamin (MULTIVITAMIN WITH MINERALS) TABS tablet Take 1 tablet by mouth daily.    . nitroGLYCERIN (NITROSTAT) 0.4 MG SL tablet Place 1 tablet (0.4 mg total) under the tongue every 5 (five) minutes as needed for chest pain. 25 tablet 3   No facility-administered medications prior to visit.      Allergies:   Patient has no known allergies.   Social History   Social History  . Marital status: Married    Spouse name: N/A  . Number of children: N/A  . Years of education: N/A   Occupational History  . [name] Director    Social History Main  Topics  . Smoking status: Former Smoker    Types: Cigars    Quit date: 01/12/2012  . Smokeless tobacco: Never Used  . Alcohol use No  . Drug use: No  . Sexual activity: Not Asked   Other Topics Concern  . None   Social History Narrative  . None   Family History:  The patient's  Mother had pancreatic cancer. Siblings with HTN   ROS:   Please see the history of present illness.    ROS All other systems reviewed and are negative.   PHYSICAL EXAM:   VS:  BP 130/72   Pulse 71   Ht 5\' 10"  (1.778 m)   Wt 234 lb 6.4 oz (106.3 kg)   BMI 33.63 kg/m    GEN: Well nourished, well developed, in no acute distress  HEENT: normal  Neck: no JVD, carotid bruits, or masses Cardiac: RRR; no murmurs, rubs, or gallops,no edema  Respiratory:  clear to auscultation bilaterally, normal work of breathing GI: soft, nontender, nondistended, + BS MS: no deformity or atrophy  Skin: warm and dry, no rash Neuro:  Alert and Oriented x 3, Strength and sensation are intact Psych: euthymic mood, full affect   Wt Readings from Last 3 Encounters:  02/23/16 234 lb 6.4 oz (106.3 kg)  07/12/15 229 lb (103.9 kg)  06/24/15 229 lb 9.6 oz (104.1 kg)      Studies/Labs Reviewed:   EKG:  EKG is NOT ordered today.    Recent Labs: 07/12/2015: BUN 15; Creatinine, Ser 1.01; Hemoglobin 14.5; Platelets 218; Potassium 3.9; Sodium 139   Lipid Panel    Component Value Date/Time   CHOL 174 12/20/2013 0840   TRIG 113.0 12/20/2013 0840   HDL 39.80 12/20/2013 0840   CHOLHDL 4 12/20/2013 0840   VLDL 22.6 12/20/2013 0840   LDLCALC 112 (H) 12/20/2013 0840    Additional studies/ records that were reviewed today include:  2D ECHO: 01/26/2012 LV EF: 50% -  55% Study Conclusions - Left ventricle: Wall thickness was increased in a pattern of severe LVH. Systolic function was normal. The estimated ejection fraction was in the range of 50% to 55%. There is severe hypokinesis of the apical myocardium. - Left  atrium: The atrium was mildly dilated.   ASSESSMENT & PLAN:   Pre op clearance: cleared for hip surgery at [name]  CAD: stable. Continue ASA, statin and BB   OSA on CPAP: followed by Dr. Maple Hudson    HTN: BP well controlled today  HLD: continue statin   Medication Adjustments/Labs and Tests Ordered: Current  medicines are reviewed at length with the patient today.  Concerns regarding medicines are outlined above.  Medication changes, Labs and Tests ordered today are listed in the Patient Instructions below. Patient Instructions  Medication Instructions:  Your physician recommends that you continue on your current medications as directed. Please refer to the Current Medication list given to you today.   Labwork: None ordered  Testing/Procedures: None ordered  Follow-Up: Your physician wants you to follow-up in: 1 YEAR WITH DR. Marlou StarksSMITH  You will receive a reminder letter in the mail two months in advance. If you don't receive a letter, please call our office to schedule the follow-up appointment.   Any Other Special Instructions Will Be Listed Below (If Applicable).   If you need a refill on your cardiac medications before your next appointment, please call your pharmacy.      Signed, Cline CrockKathryn Kaho Selle, PA-C  02/23/2016 3:37 PM    [facility] [address], [place], [place]  [address] Phone: [phone]; Fax: [phone]) [phone]

## 2016-02-23 ENCOUNTER — Ambulatory Visit (INDEPENDENT_AMBULATORY_CARE_PROVIDER_SITE_OTHER): Payer: 59 | Admitting: Physician Assistant

## 2016-02-23 ENCOUNTER — Encounter: Payer: Self-pay | Admitting: *Deleted

## 2016-02-23 VITALS — BP 130/72 | HR 71 | Ht 70.0 in | Wt 234.4 lb

## 2016-02-23 DIAGNOSIS — Z01818 Encounter for other preprocedural examination: Secondary | ICD-10-CM

## 2016-02-23 DIAGNOSIS — Z9989 Dependence on other enabling machines and devices: Secondary | ICD-10-CM

## 2016-02-23 DIAGNOSIS — G4733 Obstructive sleep apnea (adult) (pediatric): Secondary | ICD-10-CM

## 2016-02-23 DIAGNOSIS — E785 Hyperlipidemia, unspecified: Secondary | ICD-10-CM | POA: Diagnosis not present

## 2016-02-23 DIAGNOSIS — I10 Essential (primary) hypertension: Secondary | ICD-10-CM | POA: Diagnosis not present

## 2016-02-23 DIAGNOSIS — I2510 Atherosclerotic heart disease of native coronary artery without angina pectoris: Secondary | ICD-10-CM | POA: Diagnosis not present

## 2016-02-23 NOTE — Patient Instructions (Addendum)
Medication Instructions:  Your physician recommends that you continue on your current medications as directed. Please refer to the Current Medication list given to you today.   Labwork: None ordered  Testing/Procedures: None ordered  Follow-Up: Your physician wants you to follow-up in: 1 YEAR WITH DR. SMITH  You will receive a reminder letter in the mail two months in advance. If you don't receive a letter, please call our office to schedule the follow-up appointment.   Any Other Special Instructions Will Be Listed Below (If Applicable).    If you need a refill on your cardiac medications before your next appointment, please call your pharmacy.   

## 2016-03-05 ENCOUNTER — Other Ambulatory Visit: Payer: Self-pay | Admitting: Physician Assistant

## 2016-03-05 DIAGNOSIS — E785 Hyperlipidemia, unspecified: Secondary | ICD-10-CM

## 2016-03-05 NOTE — Telephone Encounter (Signed)
Patient was seen recently but does not have a recent lipid panel in epic. Okay to refill? Please advise. Thanks, MI

## 2016-03-10 ENCOUNTER — Other Ambulatory Visit: Payer: Self-pay | Admitting: Physician Assistant

## 2016-03-10 MED ORDER — ATORVASTATIN CALCIUM 80 MG PO TABS: ORAL_TABLET | ORAL | 3 refills | Status: DC

## 2016-03-10 NOTE — Telephone Encounter (Signed)
Okay to refill atorvastatin? Patient does not have a recent lipid panel in epic. Patients wife called and stated that the patient is having hip replacement surgery tomorrow and really needs this to be refilled today. Please advise. Thanks, MI

## 2016-03-10 NOTE — Telephone Encounter (Signed)
Left message to call back Pt needs lab appt for fasting labs.

## 2016-03-11 NOTE — Telephone Encounter (Signed)
Appears this was filled yesterday.  I have attempted to contact pt and have him come in for labs.

## 2016-03-16 ENCOUNTER — Ambulatory Visit
Admission: RE | Admit: 2016-03-16 | Discharge: 2016-03-16 | Disposition: A | Discharge status: Home / Self Care | Payer: 59 | Source: Ambulatory Visit | Attending: Physician Assistant | Admitting: Physician Assistant

## 2016-03-16 ENCOUNTER — Other Ambulatory Visit: Payer: Self-pay | Admitting: Physician Assistant

## 2016-03-16 ENCOUNTER — Ambulatory Visit: Payer: Self-pay | Admitting: Orthopedic Surgery

## 2016-03-16 DIAGNOSIS — M79604 Pain in right leg: Secondary | ICD-10-CM | POA: Diagnosis present

## 2016-03-16 DIAGNOSIS — M7989 Other specified soft tissue disorders: Secondary | ICD-10-CM | POA: Diagnosis present

## 2016-03-16 DIAGNOSIS — Z96641 Presence of right artificial hip joint: Secondary | ICD-10-CM | POA: Diagnosis not present

## 2016-03-16 DIAGNOSIS — R609 Edema, unspecified: Secondary | ICD-10-CM

## 2016-03-16 DIAGNOSIS — R52 Pain, unspecified: Secondary | ICD-10-CM

## 2016-04-07 ENCOUNTER — Inpatient Hospital Stay: Admit: 2016-04-07 | Payer: 59 | Admitting: Orthopedic Surgery

## 2016-04-07 SURGERY — ARTHROPLASTY, HIP, TOTAL, ANTERIOR APPROACH
Anesthesia: Choice | Site: Hip | Laterality: Right

## 2016-06-23 ENCOUNTER — Ambulatory Visit
Admission: RE | Admit: 2016-06-23 | Discharge: 2016-06-23 | Disposition: A | Discharge status: Home / Self Care | Payer: 59 | Source: Ambulatory Visit | Attending: Physician Assistant | Admitting: Physician Assistant

## 2016-06-23 ENCOUNTER — Other Ambulatory Visit: Payer: Self-pay | Admitting: Physician Assistant

## 2016-06-23 ENCOUNTER — Ambulatory Visit: Admission: RE | Admit: 2016-06-23 | Payer: 59 | Source: Ambulatory Visit

## 2016-06-23 DIAGNOSIS — M7989 Other specified soft tissue disorders: Secondary | ICD-10-CM

## 2016-06-23 DIAGNOSIS — R600 Localized edema: Secondary | ICD-10-CM | POA: Insufficient documentation

## 2016-06-23 DIAGNOSIS — M79669 Pain in unspecified lower leg: Secondary | ICD-10-CM

## 2016-06-24 ENCOUNTER — Telehealth: Payer: Self-pay | Admitting: Interventional Cardiology

## 2016-06-24 NOTE — Telephone Encounter (Signed)
Patient is scheduled to come in tomorrow to see Dr. Katrinka BlazingSmith and would like to know if he needs to have blood work completed. Thanks.

## 2016-06-24 NOTE — Progress Notes (Signed)
Cardiology Office Note    Date:  06/25/2016   ID:  Lawrence [name], DOB Jan 30, 1950, MRN [id number]  PCP:  Dorothyann Peng, MD  Cardiologist: [name], MD   Chief Complaint  Patient presents with  . Coronary Artery Disease    History of Present Illness:  Lawrence [name] is a 66 y.o. male with a history of CAD, s/p NSTEMI (apical infarct) in 01/2012 s/p unsuccessful POBA of the distal LAD, subsequent apical wall motion abnormality and was treated with anticoagulation for 6 weeks post MI, OSA on CPAP, HTN, and HLD who presents to clinic for pre op clearance prior to hip arthroplasty.  He has no cardiac complaints. Denies chest pain. No exertional dyspnea. He is concerned about the recent development of right lower extremity swelling. This began after a tennis injury where he felt discomfort in the back of his right leg. Since then, he feels her right knee has been tight and there is been some swelling in right lower extremity. A DVT study was done in [facility] yesterday and there was no evidence of clot.   Past Medical History:  Diagnosis Date  . Anxiety   . Arthritis    hips  . CAD (coronary artery disease)    a. NSTEMI 1/14 - LHC: Ostial/proximal LAD 50%, distal LAD occluded near the apex with faint left to left collaterals, EF 50% with apical akinesis.;  b. Echo 1/14: Severe LVH, EF 50-55%, apical HK, mild LAE;  c. ETT-Myoview 3/14: EF 69%, apical scar, no ischemia;   d. ETT 12/15: no ST ischemic ST changes   . GERD (gastroesophageal reflux disease)    OTC  . History of MI (myocardial infarction)   . Hyperlipidemia LDL goal < 70   . Hypertension   . Left ventricular aneurysm   . Sleep apnea    on CPAP    Past Surgical History:  Procedure Laterality Date  . CARDIAC CATHETERIZATION    . CORONARY STENT PLACEMENT    . HERNIA REPAIR Left    inguinal   . LEFT HEART CATHETERIZATION WITH CORONARY ANGIOGRAM N/A 01/23/2012   Procedure: LEFT HEART CATHETERIZATION WITH  CORONARY ANGIOGRAM;  Surgeon: [name], MD;  Location: [facility] CATH LAB;  Service: Cardiovascular;  Laterality: N/A;    Current Medications: Outpatient Medications Prior to Visit  Medication Sig Dispense Refill  . ALPRAZolam (XANAX) 0.25 MG tablet Take 1 tablet (0.25 mg total) by mouth at bedtime as needed for anxiety. 5 tablet 0  . Ascorbic Acid (VITAMIN C) 1000 MG tablet Take 1,000 mg by mouth daily.    Marland Kitchen aspirin EC 81 MG tablet Take 81 mg by mouth every morning.    Marland Kitchen atorvastatin (LIPITOR) 80 MG tablet TAKE 1/2 TABLET EVERY DAY AT 6PM 90 tablet 3  . Cholecalciferol (VITAMIN D3) 1000 UNITS CAPS Take 4,000 Units by mouth daily.     Marland Kitchen co-enzyme Q-10 50 MG capsule Take 50 mg by mouth every evening.     . fish oil-omega-3 fatty acids 1000 MG capsule Take 1 g by mouth daily.    . hydrochlorothiazide (MICROZIDE) 12.5 MG capsule TAKE ONE CAPSULE BY MOUTH EVERY DAY 90 capsule 3  . lisinopril (PRINIVIL,ZESTRIL) 10 MG tablet Take 1 tablet (10 mg total) by mouth daily. 30 tablet 11  . metoprolol succinate (TOPROL-XL) 50 MG 24 hr tablet TAKE 1/2 TABLET (25 MG TOTAL) BY MOUTH ONCE DAILY 45 tablet 3  . Multiple Vitamin (MULTIVITAMIN WITH MINERALS) TABS tablet  Take 1 tablet by mouth daily.    . nitroGLYCERIN (NITROSTAT) 0.4 MG SL tablet Place 1 tablet (0.4 mg total) under the tongue every 5 (five) minutes as needed for chest pain. 25 tablet 3   No facility-administered medications prior to visit.      Allergies:   Patient has no known allergies.   Social History   Social History  . Marital status: Married    Spouse name: N/A  . Number of children: N/A  . Years of education: N/A   Occupational History  . [employer]    Social History Main Topics  . Smoking status: Former Smoker    Types: Cigars    Quit date: 01/12/2012  . Smokeless tobacco: Never Used  . Alcohol use No  . Drug use: No  . Sexual activity: Not Asked   Other Topics Concern  . None   Social History Narrative    . None     Family History:  The patient's family history is not on file.   ROS:   Please see the history of present illness.    Right ankle swelling, tightness/swelling in right knee over the past 2 weeks. This occurred after playing tennis. Status post right hip arthroplasty without complications. No palpitations, syncope, or neurological complaints.  All other systems reviewed and are negative.   PHYSICAL EXAM:   VS:  BP 126/88 (BP Location: Left Arm)   Pulse 76   Ht 5\' 10"  (1.778 m)   Wt 233 lb 6.4 oz (105.9 kg)   BMI 33.49 kg/m    GEN: Well nourished, well developed, in no acute distress  HEENT: normal  Neck: no JVD, carotid bruits, or masses Cardiac: RRR; no murmurs, rubs, or gallops. There is right ankle edema. There is fullness in the posterior right knee area possibly consistent with a small collection of fluid/Baker's cyst. He does have superficial varicose veins in the right thigh. Arterial pulses are 2+ and symmetric bilateral. Respiratory:  clear to auscultation bilaterally, normal work of breathing GI: soft, nontender, nondistended, + BS MS: no deformity or atrophy  Skin: warm and dry, no rash Neuro:  Alert and Oriented x 3, Strength and sensation are intact Psych: euthymic mood, full affect  Wt Readings from Last 3 Encounters:  06/25/16 233 lb 6.4 oz (105.9 kg)  02/23/16 234 lb 6.4 oz (106.3 kg)  07/12/15 229 lb (103.9 kg)      Studies/Labs Reviewed:   EKG:  EKG  Anteroapical infarct with Q waves. Left axis deviation. Normal sinus rhythm  Recent Labs: 07/12/2015: BUN 15; Creatinine, Ser 1.01; Hemoglobin 14.5; Platelets 218; Potassium 3.9; Sodium 139   Lipid Panel    Component Value Date/Time   CHOL 174 12/20/2013 0840   TRIG 113.0 12/20/2013 0840   HDL 39.80 12/20/2013 0840   CHOLHDL 4 12/20/2013 0840   VLDL 22.6 12/20/2013 0840   LDLCALC 112 (H) 12/20/2013 0840    Additional studies/ records that were reviewed today include:   ECHOCARDIOGRAM  01/2012: Study Conclusions  - Left ventricle: Wall thickness was increased in a pattern of severe LVH. Systolic function was normal. The estimated ejection fraction was in the range of 50% to 55%. There is severe hypokinesis of the apical myocardium. - Left atrium: The atrium was mildly dilated.   ASSESSMENT:    1. Coronary artery disease involving native coronary artery of native heart without angina pectoris   2. Essential hypertension   3. Hyperlipidemia, unspecified hyperlipidemia type   4.  Obstructive sleep apnea   5. NSTEMI. old.   6. Edema of right foot      PLAN:  In order of problems listed above:  1. No anginal complaints. Stable abnormal baseline EKG showing evidence of prior infarction. Exercise treadmill test prior to the next office visit in one year. 2. Excellent blood pressure control on the current medical regimen. Low salt diet, weight control, and aerobic activity or encouraged. 3. LDL cholesterol less than 70. A lipid panel will be obtained today. 4. Continue positive airway pressure as management of this problem. 5. No evidence of volume overload or heart failure. 6. Ankle and foot trace to 1+ edema on the right with normal exam on the left. There is fullness in the right knee posteriorly. I'm concerned he may have Baker's cyst. I have recommended ibuprofen 400 mg twice daily with food for one week. If the swelling continues to be a problem he will need to see an orthopedist. He had a DVT study done yesterday at an outpatient clinic and was told there is no "clot".  I encouraged aerobic activity. I instructed consideration of a compression stocking on the right lower extremity. Anti-inflammatory therapy as prescribed and if no improvement in swelling behind the knee and lower extremity swelling he will need to see an orthopedist to be evaluated for Baker's cyst. Dressed has a bead on prior to her on next visit. A lipid panel was obtained today.  Medication  Adjustments/Labs and Tests Ordered: Current medicines are reviewed at length with the patient today.  Concerns regarding medicines are outlined above.  Medication changes, Labs and Tests ordered today are listed in the Patient Instructions below. Patient Instructions  Medication Instructions:  1) TAKE Ibuprofen 400mg  twice daily with food for a week.  If symptoms persist, please see an orthopaedic doctor to rule out baker's cyst.   Labwork: None  Testing/Procedures: Your physician has requested that you have an exercise tolerance test about 6-8 weeks prior to your one year follow up with Dr. Katrinka Blazing,. For further information please visit [email or web address] Please also follow instruction sheet, as given.    Follow-Up: Your physician wants you to follow-up in: 1 year with Dr. Katrinka Blazing.  You will receive a reminder letter in the mail two months in advance. If you don't receive a letter, please call our office to schedule the follow-up appointment.   Any Other Special Instructions Will Be Listed Below (If Applicable).  Dr. Katrinka Blazing would like for you to wear compression stockings on your right leg to see if this helps with right leg swelling.    If you need a refill on your cardiac medications before your next appointment, please call your pharmacy.      Signed, [name], MD  06/25/2016 11:17 AM    [facility] [address] [place], [place], [place]  [address] Phone: [phone]; Fax: [phone]

## 2016-06-24 NOTE — Telephone Encounter (Signed)
Spoke with wife, DPR on file.  Advised her to have pt come fasting so we can get fasting labs as he is overdue.  Wife verbalized understanding and was in agreement with this plan.

## 2016-06-25 ENCOUNTER — Ambulatory Visit (INDEPENDENT_AMBULATORY_CARE_PROVIDER_SITE_OTHER): Payer: 59 | Admitting: Interventional Cardiology

## 2016-06-25 ENCOUNTER — Encounter: Payer: Self-pay | Admitting: Interventional Cardiology

## 2016-06-25 VITALS — BP 126/88 | HR 76 | Ht 70.0 in | Wt 233.4 lb

## 2016-06-25 DIAGNOSIS — I252 Old myocardial infarction: Secondary | ICD-10-CM

## 2016-06-25 DIAGNOSIS — I2510 Atherosclerotic heart disease of native coronary artery without angina pectoris: Secondary | ICD-10-CM

## 2016-06-25 DIAGNOSIS — R600 Localized edema: Secondary | ICD-10-CM

## 2016-06-25 DIAGNOSIS — G4733 Obstructive sleep apnea (adult) (pediatric): Secondary | ICD-10-CM | POA: Diagnosis not present

## 2016-06-25 DIAGNOSIS — I10 Essential (primary) hypertension: Secondary | ICD-10-CM | POA: Diagnosis not present

## 2016-06-25 DIAGNOSIS — E785 Hyperlipidemia, unspecified: Secondary | ICD-10-CM | POA: Diagnosis not present

## 2016-06-25 LAB — LIPID PANEL
Chol/HDL Ratio: 3.9 ratio (ref 0.0–5.0)
Cholesterol, Total: 189 mg/dL (ref 100–199)
HDL: 49 mg/dL (ref >39)
LDL Calculated: 102 mg/dL — ABNORMAL HIGH (ref 0–99)
Triglycerides: 191 mg/dL — ABNORMAL HIGH (ref 0–149)
VLDL Cholesterol Cal: 38 mg/dL (ref 5–40)

## 2016-06-25 LAB — HEPATIC FUNCTION PANEL
ALT: 22 IU/L (ref 0–44)
AST: 21 IU/L (ref 0–40)
Albumin: 4.8 g/dL (ref 3.6–4.8)
Alkaline Phosphatase: 71 IU/L (ref 39–117)
Bilirubin Total: 0.7 mg/dL (ref 0.0–1.2)
Bilirubin, Direct: 0.18 mg/dL (ref 0.00–0.40)
Total Protein: 7.2 g/dL (ref 6.0–8.5)

## 2016-06-25 NOTE — Patient Instructions (Addendum)
Medication Instructions:  1) TAKE Ibuprofen 400mg  twice daily with food for a week.  If symptoms persist, please see an orthopaedic doctor to rule out baker's cyst.   Labwork: None  Testing/Procedures: Your physician has requested that you have an exercise tolerance test about 6-8 weeks prior to your one year follow up with Dr. Katrinka BlazingSmith,. For further information please visit [email or web address] Please also follow instruction sheet, as given.    Follow-Up: Your physician wants you to follow-up in: 1 year with Dr. Katrinka BlazingSmith.  You will receive a reminder letter in the mail two months in advance. If you don't receive a letter, please call our office to schedule the follow-up appointment.   Any Other Special Instructions Will Be Listed Below (If Applicable).  Dr. Katrinka BlazingSmith would like for you to wear compression stockings on your right leg to see if this helps with right leg swelling.    If you need a refill on your cardiac medications before your next appointment, please call your pharmacy.

## 2016-06-28 ENCOUNTER — Telehealth: Payer: Self-pay | Admitting: *Deleted

## 2016-06-28 DIAGNOSIS — E785 Hyperlipidemia, unspecified: Secondary | ICD-10-CM

## 2016-06-28 NOTE — Telephone Encounter (Signed)
Spoke with pt and went over labs and recommendations per Dr. Katrinka BlazingSmith.  Pt states that he has not been eating the proper diet for awhile now and feels this may have caused the change in his labs.  Pt would like to hold off on Zetia for now, improve diet and recheck labs 08/11/16 before going on Zetia.  Scheduled labs for 08/11/16.  Spoke with pt about importance of improving diet and possibility that Zetia may still need to be started if no or little improvement in LDL at follow up labs.  Pt appreciative for call. Will route to Dr. Katrinka BlazingSmith to make him aware.

## 2016-06-28 NOTE — Telephone Encounter (Signed)
-----   Message from Lyn RecordsHenry W Smith, MD sent at 06/25/2016  7:15 PM EDT ----- Let the patient know the LDL is not optimal. I would recommend Zetia 10 mg per day. That him know that we are cocking this information to Dr. Allyne GeeSanders. Repeat lipid panel in 6-8 weeks. Target LDL less than 70. Currently 102. A copy will be sent to Dorothyann PengSanders, Robyn, MD

## 2016-06-30 NOTE — Telephone Encounter (Signed)
Okay 

## 2016-07-26 ENCOUNTER — Emergency Department (HOSPITAL_COMMUNITY)
Admission: EM | Admit: 2016-07-26 | Discharge: 2016-07-26 | Disposition: A | Discharge status: Home / Self Care | Payer: 59 | Attending: Emergency Medicine | Admitting: Emergency Medicine

## 2016-07-26 ENCOUNTER — Other Ambulatory Visit: Payer: Self-pay

## 2016-07-26 ENCOUNTER — Encounter (HOSPITAL_COMMUNITY): Payer: Self-pay

## 2016-07-26 ENCOUNTER — Emergency Department (HOSPITAL_COMMUNITY): Payer: 59

## 2016-07-26 DIAGNOSIS — R072 Precordial pain: Secondary | ICD-10-CM | POA: Diagnosis present

## 2016-07-26 DIAGNOSIS — Z7982 Long term (current) use of aspirin: Secondary | ICD-10-CM | POA: Diagnosis not present

## 2016-07-26 DIAGNOSIS — Z955 Presence of coronary angioplasty implant and graft: Secondary | ICD-10-CM | POA: Insufficient documentation

## 2016-07-26 DIAGNOSIS — I2510 Atherosclerotic heart disease of native coronary artery without angina pectoris: Secondary | ICD-10-CM | POA: Diagnosis not present

## 2016-07-26 DIAGNOSIS — Z79899 Other long term (current) drug therapy: Secondary | ICD-10-CM | POA: Insufficient documentation

## 2016-07-26 DIAGNOSIS — Z87891 Personal history of nicotine dependence: Secondary | ICD-10-CM | POA: Diagnosis not present

## 2016-07-26 DIAGNOSIS — I10 Essential (primary) hypertension: Secondary | ICD-10-CM | POA: Diagnosis not present

## 2016-07-26 LAB — CBC
HCT: 43.3 % (ref 39.0–52.0)
Hemoglobin: 14.9 g/dL (ref 13.0–17.0)
MCH: 31.6 pg (ref 26.0–34.0)
MCHC: 34.4 g/dL (ref 30.0–36.0)
MCV: 91.9 fL (ref 78.0–100.0)
Platelets: 236 10*3/uL (ref 150–400)
RBC: 4.71 MIL/uL (ref 4.22–5.81)
RDW: 14 % (ref 11.5–15.5)
WBC: 6.4 10*3/uL (ref 4.0–10.5)

## 2016-07-26 LAB — BASIC METABOLIC PANEL
Anion gap: 7 (ref 5–15)
BUN: 18 mg/dL (ref 6–20)
CO2: 27 mmol/L (ref 22–32)
Calcium: 9.2 mg/dL (ref 8.9–10.3)
Chloride: 106 mmol/L (ref 101–111)
Creatinine, Ser: 1.11 mg/dL (ref 0.61–1.24)
GFR calc Af Amer: >60 mL/min (ref >60)
GFR calc non Af Amer: >60 mL/min (ref >60)
Glucose, Bld: 111 mg/dL — ABNORMAL HIGH (ref 65–99)
Potassium: 4.1 mmol/L (ref 3.5–5.1)
Sodium: 140 mmol/L (ref 135–145)

## 2016-07-26 LAB — POCT I-STAT TROPONIN I: Troponin i, poc: 0 ng/mL (ref 0.00–0.08)

## 2016-07-26 LAB — TROPONIN I: Troponin I: <0.03 ng/mL (ref <0.03)

## 2016-07-26 MED ORDER — ESOMEPRAZOLE MAGNESIUM 20 MG PO CPDR: 20.0000 mg | DELAYED_RELEASE_CAPSULE | Freq: Every day | ORAL | 0 refills | Status: DC

## 2016-07-26 NOTE — ED Triage Notes (Addendum)
Pt reporting chest pressure that started 10 mins ago. He also endorses SOB.  He reports a hx of MI 4 years prior. Not diaphoretic, no dizziness reported. A&Ox4. Ambulatory.

## 2016-07-26 NOTE — ED Provider Notes (Signed)
[facility]-EMERGENCY DEPT Provider Note   CSN: [id number] Arrival date & time: 07/26/16  1604     History   Chief Complaint Chief Complaint  Patient presents with  . Chest Pain    HPI Lawrence [name] is a 66 y.o. male presenting with acute onset chest pain.  PMH of NSTEMI in 2014 with stent placement.  Pt states he started to have some nausea just prior to having some central chest pain while driving. The pain was located in the center of his chest, and felt like a bubbling/burning that went up his chest. He denies  Associated diaphoresis, vomiting, SOB, or abd pain. He states it felt different than when he had his heart attack 4 years prior. Pt states that the sxs lasted for about 45 minutes, and resolved without intervention. He currently has no chest pain, SOB, or nausea. He denies fevers, chills, dizziness, weakness, urinary sxs, or abnormal bowel movements. He denies leg pain or swelling. He follows up with his cardiologist regularly, and his next apt is 08/11/2016. He used to have GERD sxs, but states that they come on infrequently, and he does not take medication for it.   HPI  Past Medical History:  Diagnosis Date  . Anxiety   . Arthritis    hips  . CAD (coronary artery disease)    a. NSTEMI 1/14 - LHC: Ostial/proximal LAD 50%, distal LAD occluded near the apex with faint left to left collaterals, EF 50% with apical akinesis.;  b. Echo 1/14: Severe LVH, EF 50-55%, apical HK, mild LAE;  c. ETT-Myoview 3/14: EF 69%, apical scar, no ischemia;   d. ETT 12/15: no ST ischemic ST changes   . GERD (gastroesophageal reflux disease)    OTC  . History of MI (myocardial infarction)   . Hyperlipidemia LDL goal < 70   . Hypertension   . Left ventricular aneurysm   . Sleep apnea    on CPAP    Patient Active Problem List   Diagnosis Date Noted  . Coronary artery disease involving native heart 11/29/2013  . Obstructive sleep apnea 01/26/2012  . Hyperlipidemia 01/24/2012  . NSTEMI. old.  01/23/2012  . HTN (hypertension) 01/23/2012    Past Surgical History:  Procedure Laterality Date  . CARDIAC CATHETERIZATION    . CORONARY STENT PLACEMENT    . HERNIA REPAIR Left    inguinal   . LEFT HEART CATHETERIZATION WITH CORONARY ANGIOGRAM N/A 01/23/2012   Procedure: LEFT HEART CATHETERIZATION WITH CORONARY ANGIOGRAM;  Surgeon: [name], MD;  Location: [facility] CATH LAB;  Service: Cardiovascular;  Laterality: N/A;       Home Medications    Prior to Admission medications   Medication Sig Start Date End Date Taking? Authorizing Provider  Ascorbic Acid (VITAMIN C) 1000 MG tablet Take 1,000 mg by mouth daily.   Yes [provider]  aspirin EC 81 MG tablet Take 81 mg by mouth every evening.    Yes [provider]  atorvastatin (LIPITOR) 80 MG tablet TAKE 1/2 TABLET EVERY DAY AT 6PM 03/10/16  Yes Weaver, Scott T, PA-C  Cholecalciferol (VITAMIN D3) 1000 UNITS CAPS Take 4,000 Units by mouth daily.    Yes [provider]  co-enzyme Q-10 50 MG capsule Take 50 mg by mouth every evening.    Yes [provider]  hydrochlorothiazide (MICROZIDE) 12.5 MG capsule TAKE ONE CAPSULE BY MOUTH EVERY DAY 03/05/16  Yes Lyn Records, MD  lisinopril (PRINIVIL,ZESTRIL) 10 MG tablet Take 1 tablet (  10 mg total) by mouth daily. 03/10/16  Yes Lyn Records, MD  metoprolol succinate (TOPROL-XL) 50 MG 24 hr tablet TAKE 1/2 TABLET (25 MG TOTAL) BY MOUTH ONCE DAILY 03/10/16  Yes Lyn Records, MD  Multiple Vitamin (MULTIVITAMIN WITH MINERALS) TABS tablet Take 1 tablet by mouth daily.   Yes [provider]  ALPRAZolam (XANAX) 0.25 MG tablet Take 1 tablet (0.25 mg total) by mouth at bedtime as needed for anxiety. Patient not taking: Reported on 07/26/2016 03/05/15   Tereso Newcomer T, PA-C  esomeprazole (NEXIUM) 20 MG capsule Take 1 capsule (20 mg total) by mouth daily. 07/26/16   Lamya Lausch, PA-C  nitroGLYCERIN (NITROSTAT) 0.4 MG SL tablet Place 1 tablet (0.4 mg  total) under the tongue every 5 (five) minutes as needed for chest pain. 03/05/15   Beatrice Lecher, PA-C    Family History Family History  Problem Relation Age of Onset  . Fainting Neg Hx   . Heart attack Neg Hx   . Heart disease Neg Hx   . Heart failure Neg Hx   . Hyperlipidemia Neg Hx   . Hypertension Neg Hx   . Anemia Neg Hx   . Asthma Neg Hx   . Clotting disorder Neg Hx     Social History Social History  Substance Use Topics  . Smoking status: Former Smoker    Types: Cigars    Quit date: 01/12/2012  . Smokeless tobacco: Never Used  . Alcohol use No     Allergies   Patient has no known allergies.   Review of Systems Review of Systems  All other systems reviewed and are negative.    Physical Exam Updated Vital Signs BP 130/83   Pulse 66   Temp 97.7 F (36.5 C) (Oral)   Resp (!) 22   SpO2 98%   Physical Exam  Constitutional: He is oriented to person, place, and time. He appears well-developed and well-nourished. No distress.  HENT:  Head: Normocephalic and atraumatic.  Eyes: Pupils are equal, round, and reactive to light.  Neck: Normal range of motion.  Cardiovascular: Normal rate, regular rhythm and intact distal pulses.   Pulmonary/Chest: Effort normal and breath sounds normal. No respiratory distress. He has no wheezes.  Abdominal: Soft. Bowel sounds are normal. He exhibits no distension. There is no tenderness. There is no guarding.  Musculoskeletal: Normal range of motion.  Neurological: He is alert and oriented to person, place, and time. No cranial nerve deficit or sensory deficit. GCS eye subscore is 4. GCS verbal subscore is 5. GCS motor subscore is 6.  Skin: Skin is warm and dry. He is not diaphoretic.  Psychiatric: He has a normal mood and affect.  Nursing note and vitals reviewed.    ED Treatments / Results  Labs (all labs ordered are listed, but only abnormal results are displayed) Labs Reviewed  BASIC METABOLIC PANEL - Abnormal; Notable  for the following:       Result Value   Glucose, Bld 111 (*)    All other components within normal limits  CBC  TROPONIN I  I-STAT TROPOININ, ED  POCT I-STAT TROPONIN I    EKG  EKG Interpretation  Date/Time:  Monday July 26 2016 16:19:53 EDT Ventricular Rate:  86 PR Interval:    QRS Duration: 88 QT Interval:  369 QTC Calculation: 442 R Axis:   -85 Text Interpretation:  Sinus rhythm Anterolateral infarct, age indeterminate No significant change since last tracing Confirmed by Richardean Canal (  [id number]) on 07/26/2016 7:45:14 PM       Radiology Dg Chest 2 View  Result Date: 07/26/2016 CLINICAL DATA:  Chest pressure.  Shortness of breath . EXAM: CHEST  2 VIEW COMPARISON:  07/01/2014.  01/24/2014. FINDINGS: Mediastinum hilar structures normal. Heart size normal. Mild peribronchial cuffing, bronchitis cannot be excluded. Basilar atelectasis. No pleural effusion or pneumothorax. Stable elevation right hemidiaphragm. IMPRESSION: Mild peribronchial cuffing. Bronchitis cannot be excluded. Basilar atelectasis. Stable elevation right hemidiaphragm. Electronically Signed   By: Maisie Fushomas  Register   On: 07/26/2016 16:33    Procedures Procedures (including critical care time)  Medications Ordered in ED Medications - No data to display   Initial Impression / Assessment and Plan / ED Course  I have reviewed the triage vital signs and the nursing notes.  Pertinent labs & imaging results that were available during my care of the patient were reviewed by me and considered in my medical decision making (see chart for details).     Pt presenting with 45 minutes of substernal chest pain, described as a bubbling/burning which was relieved without intervention. Pt currently without sxs. Physical exam reassuring. Initial troponin and EKG reassuring. Will get repeat troponin considering pt's cardiac hisotry.   Repeat troponin negative. Pt is without sxs, and has good follow up with cardiology. He appears  safe for discharge. Pain was likely related to gas or GERD. Will start pt on nexium daily. Pt to follow up with PCP if there is improvement in sxs. Pt to continue to follow up with cardiologist in august. Discussed case with attending, and Dr. Silverio LayYao agrees to plan. Return precautions given. Pt states he understands and agrees to plan.   Final Clinical Impressions(s) / ED Diagnoses   Final diagnoses:  Precordial chest pain    New Prescriptions Discharge Medication List as of 07/26/2016  8:38 PM    START taking these medications   Details  esomeprazole (NEXIUM) 20 MG capsule Take 1 capsule (20 mg total) by mouth daily., Starting Mon 07/26/2016, Print         [place], ThaxtonSophia, PA-C 07/26/16 2345    Charlynne PanderYao, David Hsienta, MD 07/26/16 [phone]

## 2016-07-26 NOTE — ED Notes (Signed)
Pt verbalized understanding of discharge instructions.

## 2016-07-26 NOTE — Discharge Instructions (Signed)
Take Nexium daily to see if there is symptom improvement. If so, you should follow up with your primary care to discuss further treatment. Follow-up with your cardiologist at your scheduled appointment. Return to the emergency department if you develop fever, chills, persistent chest pain, shortness of breath, or any new or worsening symptoms.

## 2016-08-06 ENCOUNTER — Telehealth: Payer: Self-pay | Admitting: Pulmonary Disease

## 2016-08-06 DIAGNOSIS — G4733 Obstructive sleep apnea (adult) (pediatric): Secondary | ICD-10-CM

## 2016-08-06 NOTE — Telephone Encounter (Signed)
atc pt X3, line rang to fast busy signal. Pt not seen in over a year, no rov scheduled.  Needs OV for further orders.

## 2016-08-09 NOTE — Telephone Encounter (Signed)
Pt's spouse returned call Advised that pt has not been seen in over 1 year and needs office visit for CPAP supplies Spouse voiced her understanding and [facility] She is aware insurance may not pay for supplies since ov was over 1 year ago 1st available appt is 8.20.18 w/ TP (spouse declined appt in [facility] office) [facility] Nothing further needed; will sign off

## 2016-08-09 NOTE — Telephone Encounter (Signed)
lmomtcb x 4 for the pts wife.  Will sign off of message at this time per protocol.

## 2016-08-09 NOTE — Addendum Note (Signed)
Addended by: Boone MasterJONES,  E on: 08/09/2016 09:10 AM   Modules accepted: Orders

## 2016-08-27 ENCOUNTER — Other Ambulatory Visit: Payer: Self-pay | Admitting: Physician Assistant

## 2016-08-30 ENCOUNTER — Ambulatory Visit: Payer: 59 | Admitting: Adult Health

## 2016-09-29 ENCOUNTER — Telehealth: Payer: Self-pay | Admitting: Pulmonary Disease

## 2016-09-29 NOTE — Telephone Encounter (Signed)
lmtcb X1 for pt. cpap mask was ordered as "cpap mask of choice".

## 2016-09-30 NOTE — Telephone Encounter (Signed)
Sent a message to [facility] to help with this mask issue.

## 2016-09-30 NOTE — Telephone Encounter (Signed)
Rec'd fax from [facility] regarding patient's supplies. Spoke with Dr. Craige Cotta and he is requesting patient to have follow up visit prior to filling supplies.   Attempt to call patient to inform. Left detailed message for Melissa with [facility] letting her know patient is needing a follow up visit prior to filling supplies.

## 2016-10-01 NOTE — Telephone Encounter (Signed)
Pt has been scheduled for an appointment with [facility] on 10/04/16. Nothing further was needed.

## 2016-10-04 ENCOUNTER — Ambulatory Visit: Payer: 59 | Admitting: Pulmonary Disease

## 2016-11-18 ENCOUNTER — Ambulatory Visit: Payer: 59 | Admitting: Pulmonary Disease

## 2016-11-18 ENCOUNTER — Encounter: Payer: Self-pay | Admitting: Pulmonary Disease

## 2016-11-18 VITALS — BP 124/80 | HR 87 | Ht 70.0 in | Wt 237.0 lb

## 2016-11-18 DIAGNOSIS — Z9989 Dependence on other enabling machines and devices: Secondary | ICD-10-CM

## 2016-11-18 DIAGNOSIS — G4733 Obstructive sleep apnea (adult) (pediatric): Secondary | ICD-10-CM | POA: Diagnosis not present

## 2016-11-18 DIAGNOSIS — Z23 Encounter for immunization: Secondary | ICD-10-CM | POA: Diagnosis not present

## 2016-11-18 NOTE — Patient Instructions (Signed)
Will arrange for smaller size CPAP mask  Influenza vaccination and Pneumovax today  Will show you how to use MyChart  Follow up in 1 year

## 2016-11-18 NOTE — Addendum Note (Signed)
Addended by: Cornell BarmanWHITTAKER, Jerrel Tiberio M on: 11/18/2016 02:48 PM   Modules accepted: Orders

## 2016-11-18 NOTE — Progress Notes (Signed)
Current Outpatient Medications on File Prior to Visit  Medication Sig  . ALPRAZolam (XANAX) 0.25 MG tablet Take 1 tablet (0.25 mg total) by mouth at bedtime as needed for anxiety.  . Ascorbic Acid (VITAMIN C) 1000 MG tablet Take 1,000 mg by mouth daily.  Marland Kitchen. aspirin EC 81 MG tablet Take 81 mg by mouth every evening.   Marland Kitchen. atorvastatin (LIPITOR) 80 MG tablet TAKE 1/2 TABLET EVERY DAY AT 6PM  . Cholecalciferol (VITAMIN D3) 1000 UNITS CAPS Take 4,000 Units by mouth daily.   Marland Kitchen. co-enzyme Q-10 50 MG capsule Take 50 mg by mouth every evening.   Marland Kitchen. esomeprazole (NEXIUM) 20 MG capsule Take 1 capsule (20 mg total) by mouth daily.  . hydrochlorothiazide (MICROZIDE) 12.5 MG capsule TAKE ONE CAPSULE BY MOUTH EVERY DAY  . lisinopril (PRINIVIL,ZESTRIL) 10 MG tablet Take 1 tablet (10 mg total) by mouth daily.  . metoprolol succinate (TOPROL-XL) 50 MG 24 hr tablet TAKE 1/2 TABLET (25 MG TOTAL) BY MOUTH ONCE DAILY  . Multiple Vitamin (MULTIVITAMIN WITH MINERALS) TABS tablet Take 1 tablet by mouth daily.  . nitroGLYCERIN (NITROSTAT) 0.4 MG SL tablet PLACE 1 TABLET (0.4 MG TOTAL) UNDER THE TONGUE EVERY 5 (FIVE) MINUTES AS NEEDED FOR CHEST PAIN.   No current facility-administered medications on file prior to visit.      Chief Complaint  Patient presents with  . Follow-up    Pt use to have full face mask made his mouth dry, changed mask that is smaller. He is needing new masks for both his home and travel cpap machines. Pt would like a pneumonia and flu shots today    Sleep tests HST 08/15/15 >> AHI 16.8, SaO2 low 83%  Cardiac tests Echo 01/26/12 >> EF 50 to 55%  Past medical history HTN, CAD, HLD, Anxiety, GERD, Arthritis  Past surgical history, Family history, Social history, Allergies reviewed.  Vital Signs BP 124/80 (BP Location: Left Arm, Cuff Size: Normal)   Pulse 87   Ht 5\' 10"  (1.778 m)   Wt 237 lb (107.5 kg)   SpO2 96%   BMI 34.01 kg/m   History of Present Illness Lawrence [name] is a 66  y.o. male with obstructive sleep apnea.  He feels CPAP helps.  Main issue is he got wrong sized mask.  He was using smaller full face mask.  He got sent large size.    Physical Exam  General - pleasant Eyes - pupils reactive ENT - no sinus tenderness, no oral exudate, no LAN Cardiac - regular, no murmur Chest - no wheeze, rales Abd - soft, non tender Ext - no edema Skin - no rashes Neuro - normal strength Psych - normal mood  Assessment/Plan  Obstructive sleep apnea. - he is compliant with CPAP and reports benefit - continue CPAP 8 cm H2O - Lawrence arrange for smaller sized masked   Patient Instructions  Lawrence arrange for smaller size CPAP mask  Influenza vaccination and Pneumovax today  Lawrence show you how to use MyChart  Follow up in 1 year     Coralyn HellingVineet Maisa Bedingfield, MD [facility] Pager:  [phone] 11/18/2016, 9:30 AM

## 2017-03-02 ENCOUNTER — Other Ambulatory Visit: Payer: Self-pay | Admitting: Interventional Cardiology

## 2017-03-12 ENCOUNTER — Other Ambulatory Visit: Payer: Self-pay | Admitting: Interventional Cardiology

## 2017-03-17 ENCOUNTER — Other Ambulatory Visit: Payer: Self-pay | Admitting: Physician Assistant

## 2017-06-09 ENCOUNTER — Other Ambulatory Visit: Payer: Self-pay | Admitting: Interventional Cardiology

## 2017-06-19 ENCOUNTER — Other Ambulatory Visit: Payer: Self-pay | Admitting: Physician Assistant

## 2017-06-27 ENCOUNTER — Other Ambulatory Visit: Payer: Self-pay | Admitting: Interventional Cardiology

## 2017-07-14 ENCOUNTER — Other Ambulatory Visit: Payer: Self-pay | Admitting: Interventional Cardiology

## 2017-07-18 ENCOUNTER — Other Ambulatory Visit: Payer: Self-pay | Admitting: Interventional Cardiology

## 2017-07-20 ENCOUNTER — Other Ambulatory Visit: Payer: Self-pay | Admitting: Physician Assistant

## 2017-08-02 ENCOUNTER — Other Ambulatory Visit: Payer: Self-pay | Admitting: Interventional Cardiology

## 2017-08-23 ENCOUNTER — Other Ambulatory Visit: Payer: Self-pay | Admitting: Interventional Cardiology

## 2017-09-04 ENCOUNTER — Other Ambulatory Visit: Payer: Self-pay | Admitting: Interventional Cardiology

## 2017-09-11 ENCOUNTER — Other Ambulatory Visit: Payer: Self-pay | Admitting: Interventional Cardiology

## 2017-09-15 ENCOUNTER — Ambulatory Visit: Payer: BLUE CROSS/BLUE SHIELD | Admitting: Cardiology

## 2017-09-15 ENCOUNTER — Encounter: Payer: Self-pay | Admitting: Cardiology

## 2017-09-15 VITALS — BP 124/66 | HR 62 | Ht 70.0 in | Wt 234.0 lb

## 2017-09-15 DIAGNOSIS — I252 Old myocardial infarction: Secondary | ICD-10-CM | POA: Diagnosis not present

## 2017-09-15 DIAGNOSIS — I10 Essential (primary) hypertension: Secondary | ICD-10-CM | POA: Diagnosis not present

## 2017-09-15 DIAGNOSIS — I2510 Atherosclerotic heart disease of native coronary artery without angina pectoris: Secondary | ICD-10-CM | POA: Diagnosis not present

## 2017-09-15 DIAGNOSIS — G4733 Obstructive sleep apnea (adult) (pediatric): Secondary | ICD-10-CM | POA: Diagnosis not present

## 2017-09-15 NOTE — Patient Instructions (Signed)
Medication Instructions:  Your physician recommends that you continue on your current medications as directed. Please refer to the Current Medication list given to you today.   Labwork: NONE ORDERED TODAY  Testing/Procedures: NONE ORDERED TODAY  Follow-Up: 1 YEAR WITH DR. Katrinka Blazing   Any Other Special Instructions Will Be Listed Below (If Applicable).     If you need a refill on your cardiac medications before your next appointment, please call your pharmacy.

## 2017-09-15 NOTE — Progress Notes (Signed)
Cardiology Office Note   Date:  09/16/2017   ID:  Lawrence [name], DOB [birth year], MRN [id number]  PCP:  Andi Devon, MD  Cardiologist:  Dr. Katrinka Blazing     Chief Complaint  Patient presents with  . Coronary Artery Disease      History of Present Illness: Lawrence [name] is a 67 y.o. male who presents for CAD.  He has a history of CAD, s/p NSTEMI (apical infarct) in 01/2012 s/p unsuccessful POBA of the distal LAD, subsequent apical wall motion abnormality and was treated with anticoagulation for 6 weeks post MI, OSA on CPAP, HTN, and HLD   Follow-up treadmill test was normal. Plavix was stopped. He had done quite well since.  Today he has no chest pain and no SOB.  Feels well.  Is active tries to eat healthy.   He belives Dr. Renae Gloss has check lipids recently, but he feels great.    Past Medical History:  Diagnosis Date  . Anxiety   . Arthritis    hips  . CAD (coronary artery disease)    a. NSTEMI 1/14 - [facility]: Ostial/proximal LAD 50%, distal LAD occluded near the apex with faint left to left collaterals, EF 50% with apical akinesis.;  b. Echo 1/14: Severe LVH, EF 50-55%, apical HK, mild LAE;  c. ETT-Myoview 3/14: EF 69%, apical scar, no ischemia;   d. ETT 12/15: no ST ischemic ST changes   . Coronary artery disease involving native heart 11/29/2013   Occluded apical LAD. 50% proximal to mid LAD. Apical infarct January 2014.   Marland Kitchen GERD (gastroesophageal reflux disease)    OTC  . History of MI (myocardial infarction)   . HTN (hypertension) 01/23/2012  . Hyperlipidemia 01/24/2012   LDL > 160 on admission   . Hyperlipidemia LDL goal < 70   . Hypertension   . Left ventricular aneurysm   . NSTEMI. old. 01/23/2012  . Obstructive sleep apnea 01/26/2012   Greater than 20 year history   . Sleep apnea    on CPAP    Past Surgical History:  Procedure Laterality Date  . CARDIAC CATHETERIZATION    . CORONARY STENT PLACEMENT    . HERNIA REPAIR Left    inguinal   . LEFT HEART  CATHETERIZATION WITH CORONARY ANGIOGRAM N/A 01/23/2012   Procedure: LEFT HEART CATHETERIZATION WITH CORONARY ANGIOGRAM;  Surgeon: [name], MD;  Location: [facility] CATH LAB;  Service: Cardiovascular;  Laterality: N/A;     Current Outpatient Medications  Medication Sig Dispense Refill  . ALPRAZolam (XANAX) 0.25 MG tablet Take 1 tablet (0.25 mg total) by mouth at bedtime as needed for anxiety. 5 tablet 0  . Ascorbic Acid (VITAMIN C) 1000 MG tablet Take 1,000 mg by mouth daily.    Marland Kitchen aspirin EC 81 MG tablet Take 81 mg by mouth every evening.     Marland Kitchen atorvastatin (LIPITOR) 80 MG tablet TAKE 1/2 TABLET BY MOUTH DAILY AT 6PM 7 tablet 0  . Cholecalciferol (VITAMIN D3) 1000 UNITS CAPS Take 4,000 Units by mouth daily.     Marland Kitchen co-enzyme Q-10 50 MG capsule Take 50 mg by mouth every evening.     Marland Kitchen esomeprazole (NEXIUM) 20 MG capsule Take 1 capsule (20 mg total) by mouth daily. 20 capsule 0  . hydrochlorothiazide (MICROZIDE) 12.5 MG capsule Take 1 capsule (12.5 mg total) by mouth daily. Please make overdue appt with Dr. Katrinka Blazing before anymore refills. 2nd attempt 15 capsule 0  . lisinopril (PRINIVIL,ZESTRIL) 10 MG tablet  Take 1 tablet (10 mg total) by mouth daily. Please keep 9/5 appointment for additional refills thanks. 30 tablet 0  . metoprolol succinate (TOPROL-XL) 50 MG 24 hr tablet Take 1 tablet (50 mg total) by mouth daily. Keep up coming office visit 30 tablet 0  . Multiple Vitamin (MULTIVITAMIN WITH MINERALS) TABS tablet Take 1 tablet by mouth daily.    . nitroGLYCERIN (NITROSTAT) 0.4 MG SL tablet PLACE 1 TABLET (0.4 MG TOTAL) UNDER THE TONGUE EVERY 5 (FIVE) MINUTES AS NEEDED FOR CHEST PAIN. 25 tablet 5   No current facility-administered medications for this visit.     Allergies:   Patient has no known allergies.    Social History:  The patient  reports that he quit smoking about 5 years ago. His smoking use included cigars. He has never used smokeless tobacco. He reports that he does not drink  alcohol or use drugs.   Family History:  The patient's family history includes Hypertension in his mother.    ROS:  General:no colds or fevers, no weight changes Skin:no rashes or ulcers HEENT:no blurred vision, no congestion CV:see HPI PUL:see HPI--wears his CPAP GI:no diarrhea constipation or melena, no indigestion GU:no hematuria, no dysuria MS:no joint pain, no claudication Neuro:no syncope, no lightheadedness Endo:no diabetes, no thyroid disease  Wt Readings from Last 3 Encounters:  09/15/17 234 lb (106.1 kg)  11/18/16 237 lb (107.5 kg)  06/25/16 233 lb 6.4 oz (105.9 kg)     PHYSICAL EXAM: VS:  BP 124/66   Pulse 62   Ht 5\' 10"  (1.778 m)   Wt 234 lb (106.1 kg)   BMI 33.58 kg/m  , BMI Body mass index is 33.58 kg/m. General:Pleasant affect, NAD Skin:Warm and dry, brisk capillary refill HEENT:normocephalic, sclera clear, mucus membranes moist Neck:supple, no JVD, no bruits  Heart:S1S2 RRR without murmur, gallup, rub or click Lungs:clear without rales, rhonchi, or wheezes HYI:FOYD, non tender, + BS, do not palpate liver spleen or masses Ext:no lower ext edema, 2+ pedal pulses, 2+ radial pulses Neuro:alert and oriented X 3, MAE, follows commands, + facial symmetry    EKG:  EKG is ordered today. The ekg ordered today demonstrates SR LAD old inf MI no acute chanes    Recent Labs: No results found for requested labs within last 8760 hours.    Lipid Panel    Component Value Date/Time   CHOL 189 06/25/2016 1113   TRIG 191 (H) 06/25/2016 1113   HDL 49 06/25/2016 1113   CHOLHDL 3.9 06/25/2016 1113   CHOLHDL 4 12/20/2013 0840   VLDL 22.6 12/20/2013 0840   LDLCALC 102 (H) 06/25/2016 1113       Other studies Reviewed: Additional studies/ records that were reviewed today include: .  1. Acute coronary syndrome with total occlusion of the apical segment of the distal LAD.  2. Angioplasty was unsuccessful at recanalizing the apical segment of the LAD. Stenting  and catheter-based thrombectomy was aborted because of technical and clinical issues including the small vessel caliber, distal location, and moderate proximal LAD disease.  3. Patent ramus, circumflex, and right coronary. Moderate proximal to mid LAD disease approximately 50% stenosed. It is still possible that the proximal disease was the culprit for the ACS and the distal lesion could be embolized thrombus.  4. Apical wall motion abnormality do to LAD occlusion   RECOMMENDATION:  1. Bivalirudin infusion at 0.25 mg per kilogram per hour for 2 hours.  2. Brilinta  3. Beta blocker therapy  4. Statin therapy,  glycemic control, and aggressive blood pressure control.  5. Will likely be in the hospital for 72-96 hours do to absence of reperfusion and the remote possibility of myocardial rupture.   6. Echocardiogram to look for apical thrombus. I'm a.m. We decide to use antithrombotic therapy for 6-8 weeks because of the infarct location to     ASSESSMENT AND PLAN:  1.  CAD with hx of MI and failed PTCA to LAD.  No further pain  No SOB. Follow up with Dr. Katrinka Blazing in 1 year.  He was supposed to have ETT prior to this visit. This was not done.  Will arrange for pt to have done just to check his status of CAD as per Dr. Katrinka Blazing.      2.  HLD will check with Dr. Renae Gloss for recent labs.  3.   OSA wit cpap stable.   4.   HTN controlled continue current meds.    Current medicines are reviewed with the patient today.  The patient Has no concerns regarding medicines.  The following changes have been made:  See above Labs/ tests ordered today include:see above  Disposition:   FU:  see above  Signed, Nada Boozer, NP  09/16/2017 5:28 PM    [facility] [address] [place], [place], [place]  [address]/ [address] [place], [place] Phone: [phone]; Fax: [phone]  [phone]

## 2017-09-16 ENCOUNTER — Encounter: Payer: Self-pay | Admitting: Cardiology

## 2017-09-20 ENCOUNTER — Telehealth: Payer: Self-pay | Admitting: *Deleted

## 2017-09-20 ENCOUNTER — Other Ambulatory Visit: Payer: Self-pay | Admitting: Physician Assistant

## 2017-09-20 NOTE — Telephone Encounter (Signed)
-----   Message from Leone Brand, NP sent at 09/20/2017 12:04 PM EDT ----- Please ask pt if he had been contacted prior to visit with me for ETT?  Dr. Katrinka Blazing had wanted him to have one.  Since he was stable on visit he could have one in now or before visit the Dr. Katrinka Blazing next year.  Thanks.  Vernona Rieger   ----- Message ----- From: Lyn Records, MD Sent: 09/20/2017   8:55 AM EDT To: Leone Brand, NP  Great! ----- Message ----- From: Leone Brand, NP Sent: 09/16/2017   5:36 PM EDT To: Lyn Records, MD  Dr. Katrinka Blazing, pt was seen and doing well., last year you had ordered ETT for this year but he never had done.  I was going to order and just have him see you back in a year unless you disagree.  Vernona Rieger.

## 2017-09-20 NOTE — Telephone Encounter (Signed)
Left pt a message to call back re: Nada Boozer, NP's message.

## 2017-09-28 NOTE — Telephone Encounter (Signed)
2nd attempt to reach pt re: Nada BoozerLaura Ingold, NP's message below.  Left another message for pt to call the office.

## 2017-10-04 ENCOUNTER — Other Ambulatory Visit: Payer: Self-pay | Admitting: Interventional Cardiology

## 2017-10-05 NOTE — Telephone Encounter (Signed)
3rd attempt to reach pt re: Nada Boozer, NP's message below. Left another message for pt. Will close this encounter and wait for pt to call back.

## 2017-10-07 ENCOUNTER — Other Ambulatory Visit: Payer: Self-pay | Admitting: Interventional Cardiology

## 2017-10-11 ENCOUNTER — Other Ambulatory Visit: Payer: Self-pay | Admitting: Orthopedic Surgery

## 2017-10-12 ENCOUNTER — Telehealth: Payer: Self-pay | Admitting: *Deleted

## 2017-10-12 ENCOUNTER — Telehealth: Payer: Self-pay

## 2017-10-12 NOTE — Telephone Encounter (Signed)
Error

## 2017-10-12 NOTE — Telephone Encounter (Signed)
   [facility] Pre-operative Risk Assessment    Request for surgical clearance:  1. What type of surgery is being performed?  Left Ring Finger Fasciectomy   2. When is this surgery scheduled?  11/17/17   3. What type of clearance is required (medical clearance vs. Pharmacy clearance to hold med vs. Both)? both  4. Are there any medications that need to be held prior to surgery and how long? Aspirin   5. Practice name and name of physician performing surgery?  [facility]/ Dr Fredna Dow   6. What is your office phone number [phone]    7.   What is your office fax number [phone]  8.   Anesthesia type (None, local, MAC, general) ?  choice   Frederik Schmidt 10/12/2017, 11:36 AM  _________________________________________________________________   (provider comments below)

## 2017-10-14 ENCOUNTER — Other Ambulatory Visit: Payer: Self-pay | Admitting: Interventional Cardiology

## 2017-10-14 NOTE — Telephone Encounter (Signed)
   Primary Cardiologist: [name], MD  Chart reviewed as part of pre-operative protocol coverage. Given past medical history and time since last visit, based on ACC/AHA guidelines, Lawrence [name] would be at acceptable risk for the planned procedure without further cardiovascular testing.   Request was made by surgical team regarding ASA therapy prior to procedure. We do not typically hold ASA for this type of procedure and will defer decision back to surgical team.     I will route this recommendation to the requesting party via Epic fax function and remove from pre-op pool.  Please call with questions.  Georgie Chard, NP 10/14/2017, 8:35 AM

## 2017-11-14 NOTE — Progress Notes (Signed)
Chart reviewed with Dr. Hart Rochester and surgery was approved.  He also has cardiac clearance.

## 2017-11-15 NOTE — Progress Notes (Signed)
Contacted patient for pre-operative phone call but patient stated that he is having insurance issues and will need to reschedule the procedure. Helmut Muster, from Dr. Merrilee Seashore office notified.

## 2018-01-27 ENCOUNTER — Telehealth: Payer: Self-pay | Admitting: Pulmonary Disease

## 2018-01-27 DIAGNOSIS — G4733 Obstructive sleep apnea (adult) (pediatric): Secondary | ICD-10-CM

## 2018-01-27 NOTE — Telephone Encounter (Signed)
Order placed for CPAP supplies. Patient aware order was placed.  Understanding stated.  Nothing further at this time.    Per VS-  Okay to send order for CPAP supplies.

## 2018-01-27 NOTE — Telephone Encounter (Signed)
Okay to send order for CPAP supplies. 

## 2018-01-27 NOTE — Telephone Encounter (Signed)
Spoke with Rinaldo CloudPamela and I advised her that it has been over a year since his last OV and would need to schedule an appt. I made him an appt on Tues 01/31/2017 at 10:00 am. In the meantime, Dr. Craige CottaSood can we [facility] the [facility]? Please advise.

## 2018-01-31 ENCOUNTER — Ambulatory Visit: Payer: BLUE CROSS/BLUE SHIELD | Admitting: Pulmonary Disease

## 2018-02-09 ENCOUNTER — Telehealth: Payer: Self-pay | Admitting: *Deleted

## 2018-02-09 DIAGNOSIS — I2510 Atherosclerotic heart disease of native coronary artery without angina pectoris: Secondary | ICD-10-CM

## 2018-02-09 NOTE — Telephone Encounter (Signed)
   [facility] Pre-operative Risk Assessment    Request for surgical clearance:  1. What type of surgery is being performed? LEFT RING FINGER FASCIECTOMY   2. When is this surgery scheduled? 03/16/18   3. What type of clearance is required (medical clearance vs. Pharmacy clearance to hold med vs. Both)? MEDICAL  4. Are there any medications that need to be held prior to surgery and how long?ASA   5. Practice name and name of physician performing [facility]? [facility]; DR. Fredna Dow   6. What is your office phone number [phone]    7.   What is your office fax number [phone]  8.   Anesthesia type (None, local, MAC, general) ? CHOICE   Lawrence [name] 02/09/2018, 9:59 AM  _________________________________________________________________   (provider comments below)

## 2018-02-15 NOTE — Telephone Encounter (Signed)
It is okay to hold aspirin for 5 days prior to ring finger fasciectomy

## 2018-02-15 NOTE — Telephone Encounter (Signed)
Please address ASA holding for surgery for 5 days or 3?

## 2018-02-17 NOTE — Telephone Encounter (Signed)
Dr. Katrinka Blazing, Patient was contacted 02/17/2018 in reference to pre-operative risk assessment for pending surgery as outlined below.  Lawrence [name] was last seen on 09/15/17 by Lawrence [name].  Since that day, he has done well. During that office visit, ETT was planned but not completed prior to his clinic appt, noted as per Dr. Katrinka Blazing. He denies any anginal symptoms, changes to his medical history, and ER visits. He can complete more than 4.0 METS. Lawrence [name] was planning to set up the ETT, but again this was never completed.   Since he has residual disease, would you recommend that he complete the ETT prior to his surgery? As above, he is asymptomatic.

## 2018-02-18 NOTE — Telephone Encounter (Signed)
He needs a treadmill but I would not hold up finger surgery to get it done. Can be done as schedule allows.Proceed with surgery and okay to hold aspirin.

## 2018-02-20 ENCOUNTER — Encounter: Payer: Self-pay | Admitting: Interventional Cardiology

## 2018-02-20 NOTE — Telephone Encounter (Signed)
   Primary Cardiologist: [name], MD  Chart reviewed as part of pre-operative protocol coverage. Given past medical history and time since last visit, based on ACC/AHA guidelines, Lawrence [name] would be at acceptable risk for the planned procedure without further cardiovascular testing.   I will route this recommendation to the requesting party via Epic fax function and remove from pre-op pool.  Please call with questions.  Dr. Katrinka Blazing has cleared the patient to proceed with finger surgery after holding aspirin for 5 days. Will need to restart aspirin as soon as possible after the surgery. Dr. Katrinka Blazing also recommended a Exercise Tolerance test, however this does not necessarily need to be done prior to the surgery as he is already cleared. Please inform the requesting provider and patient.  [place], [place] 02/20/2018, 8:29 AM

## 2018-02-20 NOTE — Telephone Encounter (Signed)
Left message for patient on every number in his chart advising him to give the office a call back.  Cardiac clearance letter sent to Dr Merrilee Seashore office.  Order placed for ETT. Will inform patient once he contacts office.

## 2018-02-21 NOTE — Telephone Encounter (Signed)
Patient's wife Paulanthony Heglar returned call that was left on her voicemail. Mrs. Turnage was informed of the clearance to hole ASA for 5 days and to restart as as possible after surgery. She was also made aware of the ETT test and to have her or Mr. Magpantay to call and get it schedule at his earliest convenience.

## 2018-03-09 ENCOUNTER — Other Ambulatory Visit: Payer: Self-pay

## 2018-03-09 ENCOUNTER — Encounter (HOSPITAL_BASED_OUTPATIENT_CLINIC_OR_DEPARTMENT_OTHER): Payer: Self-pay | Admitting: *Deleted

## 2018-03-09 NOTE — Progress Notes (Signed)
History reviewed with patient.  Will come for labs on 3/2. Instructed to stop ASA 5 days prior to surgery per note from cardiologist.  Clearance on chart. Will bring CPAP DOS

## 2018-03-16 ENCOUNTER — Ambulatory Visit (HOSPITAL_BASED_OUTPATIENT_CLINIC_OR_DEPARTMENT_OTHER)
Admission: RE | Admit: 2018-03-16 | Payer: BLUE CROSS/BLUE SHIELD | Source: Ambulatory Visit | Admitting: Orthopedic Surgery

## 2018-03-16 SURGERY — FASCIECTOMY, PALM
Anesthesia: Choice | Laterality: Left

## 2018-06-02 ENCOUNTER — Other Ambulatory Visit: Payer: Self-pay | Admitting: Interventional Cardiology

## 2018-06-02 NOTE — Telephone Encounter (Signed)
Outpatient Medication Detail    Disp Refills Start End   lisinopril (PRINIVIL,ZESTRIL) 10 MG tablet 30 tablet 11 10/07/2017    Sig: TAKE 1 TABLET BY MOUTH DAILY.   Sent to pharmacy as: lisinopril (PRINIVIL,ZESTRIL) 10 MG tablet   E-Prescribing Status: Receipt confirmed by pharmacy (10/07/2017 12:10 PM EDT)   Pharmacy   [id number] - [place], [place] - [address] AT [address] OF [facility] [address]

## 2018-06-26 ENCOUNTER — Other Ambulatory Visit: Payer: Self-pay | Admitting: Physician Assistant

## 2018-08-08 ENCOUNTER — Other Ambulatory Visit: Payer: Self-pay | Admitting: Interventional Cardiology

## 2018-09-01 ENCOUNTER — Ambulatory Visit (INDEPENDENT_AMBULATORY_CARE_PROVIDER_SITE_OTHER): Payer: Self-pay | Admitting: Pulmonary Disease

## 2018-09-01 ENCOUNTER — Encounter: Payer: Self-pay | Admitting: Pulmonary Disease

## 2018-09-01 ENCOUNTER — Other Ambulatory Visit: Payer: Self-pay

## 2018-09-01 DIAGNOSIS — G4733 Obstructive sleep apnea (adult) (pediatric): Secondary | ICD-10-CM

## 2018-09-01 DIAGNOSIS — Z9989 Dependence on other enabling machines and devices: Secondary | ICD-10-CM

## 2018-09-01 NOTE — Patient Instructions (Signed)
Will arrange for new CPAP mask and supplies  Follow up in 1 year 

## 2018-09-01 NOTE — Progress Notes (Signed)
[facility]  Chief Complaint  Patient presents with  . Sleep Apnea    Need New Supplies    Constitutional:  There were no vitals taken for this visit.  Deferred  Past Medical History:  HTN, CAD, HLD, Anxiety, GERD, Arthritis  Brief Summary:  Lawrence [name] is a 68 y.o. male with obstructive sleep apnea.  Virtual Visit via Telephone Note  I connected with Lawrence [name] on 09/01/18 at  9:45 AM EDT by telephone and verified that I am speaking with the correct person using two identifiers.  Location: Patient: home Provider: medical office   I discussed the limitations, risks, security and privacy concerns of performing an evaluation and management service by telephone and the availability of in person appointments. I also discussed with the patient that there may be a patient responsible charge related to this service. The patient expressed understanding and agreed to proceed.  He uses CPAP nightly.  No issues with sore throat, sinus congestion, dry mouth, or aerophagia.  Uses full face mask.  Gets leak from mask, but hasn't received new supplies for a while.  Was using [place].   Physical Exam:  Deferred  Assessment/Plan:   Obstructive sleep apnea. - he is compliant with therapy and reports benefit - continue CPAP 8 cm H2O - will re-establish contact with DME to get new supplies   Patient Instructions  Will arrange for new CPAP mask and supplies  Follow up in 1 year   I discussed the assessment and treatment plan with the patient. The patient was provided an opportunity to ask questions and all were answered. The patient agreed with the plan and demonstrated an understanding of the instructions.   The patient was advised to call back or seek an in-person evaluation if the symptoms worsen or if the condition fails to improve as anticipated.  I provided 8 minutes of non-face-to-face time during this encounter.    Chesley Mires, MD  Chesley Mires, MD [place] Pager: [phone] 09/01/2018, 10:06 AM  Flow Sheet      Sleep tests:  HST 08/15/15 >> AHI 16.8, SaO2 low 83%   Cardiac tests:  Echo 01/26/12 >> EF 50 to 55%  Medications:   Allergies as of 09/01/2018   No Known Allergies     Medication List       Accurate as of September 01, 2018 10:06 AM. If you have any questions, ask your nurse or doctor.        ALPRAZolam 0.25 MG tablet Commonly known as: XANAX Take 1 tablet (0.25 mg total) by mouth at bedtime as needed for anxiety.   aspirin EC 81 MG tablet Take 81 mg by mouth every evening.   atorvastatin 80 MG tablet Commonly known as: LIPITOR TAKE 1/2 TABLET BY MOUTH EVERY DAY AT 6PM, please schedule appt 1st attempt   co-enzyme Q-10 50 MG capsule Take 50 mg by mouth every evening.   esomeprazole 20 MG capsule Commonly known as: NEXIUM Take 1 capsule (20 mg total) by mouth daily.   hydrochlorothiazide 12.5 MG capsule Commonly known as: MICROZIDE TAKE 1 CAPSULE BY MOUTH EVERY DAY   lisinopril 10 MG tablet Commonly known as: ZESTRIL TAKE 1 TABLET BY MOUTH DAILY.   metoprolol succinate 50 MG 24 hr tablet Commonly known as: TOPROL-XL TAKE 1/2 TABLET ONCE DAILY NEEDS OFFICE VISIT   multivitamin with minerals Tabs tablet Take 1 tablet by mouth daily.   nitroGLYCERIN 0.4 MG SL  tablet Commonly known as: NITROSTAT PLACE 1 TABLET (0.4 MG TOTAL) UNDER THE TONGUE EVERY 5 (FIVE) MINUTES AS NEEDED FOR CHEST PAIN.   vitamin C 1000 MG tablet Take 1,000 mg by mouth daily.   Vitamin D3 25 MCG (1000 UT) Caps Take 4,000 Units by mouth daily.       Past Surgical History:  He  has a past surgical history that includes Coronary stent placement; left heart catheterization with coronary angiogram (N/A, 01/23/2012); Cardiac catheterization; Hernia repair (Left); and Total hip arthroplasty (Right, 2018).  Family History:  His family history includes  Hypertension in his mother.  Social History:  He  reports that he quit smoking about 6 years ago. His smoking use included cigars. He has never used smokeless tobacco. He reports that he does not drink alcohol or use drugs.

## 2018-09-05 ENCOUNTER — Telehealth: Payer: Self-pay | Admitting: Pulmonary Disease

## 2018-09-05 NOTE — Telephone Encounter (Signed)
ATC pt wife, Olin Hauser (on [place]), line went to voicemail. LMTCB x1.

## 2018-09-06 NOTE — Telephone Encounter (Signed)
Called and spoke to patient's wife, Lawrence [name], who is on the [place].  She stated that she received a voicemail from [facility] stating they need more information from [place] before able to get CPAP supplies.  I called Melissa with [employer] and she stated she will look into it and give [place] a call back.  Per Epic the order was received on 09/01/2018.  I called patient's wife back and let her know that it is being looked into and we will update her once we hear back.

## 2018-09-08 NOTE — Telephone Encounter (Signed)
Spoke with Lenna Sciara  She states that the pt's CPAP supplies shipped on 09/07/18  Spoke with Olin Hauser and notified them of this  She verbalized understanding  Nothing further needed

## 2018-09-08 NOTE — Telephone Encounter (Signed)
Attempted to call Lawrence [name] with [employer] to follow up with CPAP supplies, no answer, left message for call back.

## 2018-09-08 NOTE — Telephone Encounter (Signed)
Melissa from [employer] returning phone call.  Melissa phone number is [phone]. °

## 2018-09-22 NOTE — Progress Notes (Deleted)
CARDIOLOGY OFFICE NOTE  Date:  09/27/2018    Lawrence [name] Date of Birth: [birth year] Medical Record #[id number]  PCP:  Andi Devon, MD  Cardiologist:  Princella Ion chief complaint on file.   History of Present Illness: Lawrence [name] is a 68 y.o. male who presents today for a follow up visit. Seen for Dr. Katrinka Blazing.   He has ahistory of CAD,s/p NSTEMI (apical infarct) in 1/2014s/p unsuccessful POBAof the distal LAD, subsequentapical wall motion abnormality and was treated with anticoagulation for 6 weeks post MI, OSAon CPAP, HTN,andHLD.    Follow-up treadmill test was normal. Plavix has been stopped.   He last saw Dr. Katrinka Blazing in June of 2018. Seen last September 2019 by Nada Boozer, NP and was continuing to do well.   The patient {does/does not:200015} have symptoms concerning for COVID-19 infection (fever, chills, cough, or new shortness of breath).   Comes in today. Here with   Past Medical History:  Diagnosis Date  . Anxiety   . Arthritis    hips  . CAD (coronary artery disease)    a. NSTEMI 1/14 - LHC: Ostial/proximal LAD 50%, distal LAD occluded near the apex with faint left to left collaterals, EF 50% with apical akinesis.;  b. Echo 1/14: Severe LVH, EF 50-55%, apical HK, mild LAE;  c. ETT-Myoview 3/14: EF 69%, apical scar, no ischemia;   d. ETT 12/15: no ST ischemic ST changes   . Coronary artery disease involving native heart 11/29/2013   Occluded apical LAD. 50% proximal to mid LAD. Apical infarct January 2014.   Lawrence [name] GERD (gastroesophageal reflux disease)    OTC  . History of MI (myocardial infarction)   . HTN (hypertension) 01/23/2012  . Hyperlipidemia 01/24/2012   LDL > 160 on admission   . Hyperlipidemia LDL goal < 70   . Hypertension   . Left ventricular aneurysm   . NSTEMI. old. 01/23/2012  . Obstructive sleep apnea 01/26/2012   Greater than 20 year history   . Sleep apnea    on CPAP    Past Surgical History:  Procedure Laterality Date   . CARDIAC CATHETERIZATION    . CORONARY STENT PLACEMENT    . HERNIA REPAIR Left    inguinal   . LEFT HEART CATHETERIZATION WITH CORONARY ANGIOGRAM N/A 01/23/2012   Procedure: LEFT HEART CATHETERIZATION WITH CORONARY ANGIOGRAM;  Surgeon: [name], MD;  Location: [facility] CATH LAB;  Service: Cardiovascular;  Laterality: N/A;  . TOTAL HIP ARTHROPLASTY Right 2018     Medications: No outpatient medications have been marked as taking for the 09/27/18 encounter (Appointment) with Rosalio Macadamia, NP.     Allergies: No Known Allergies  Social History: The patient  reports that he quit smoking about 6 years ago. His smoking use included cigars. He has never used smokeless tobacco. He reports that he does not drink alcohol or use drugs.   Family History: The patient's ***family history includes Hypertension in his mother.   Review of Systems: Please see the history of present illness.   All other systems are reviewed and negative.   Physical Exam: VS:  There were no vitals taken for this visit. Lawrence [name]  BMI There is no height or weight on file to calculate BMI.  Wt Readings from Last 3 Encounters:  09/15/17 234 lb (106.1 kg)  11/18/16 237 lb (107.5 kg)  06/25/16 233 lb 6.4 oz (105.9 kg)    General: Pleasant. Well developed, well nourished  and in no acute distress.   HEENT: Normal.  Neck: Supple, no JVD, carotid bruits, or masses noted.  Cardiac: ***Regular rate and rhythm. No murmurs, rubs, or gallops. No edema.  Respiratory:  Lungs are clear to auscultation bilaterally with normal work of breathing.  GI: Soft and nontender.  MS: No deformity or atrophy. Gait and ROM intact.  Skin: Warm and dry. Color is normal.  Neuro:  Strength and sensation are intact and no gross focal deficits noted.  Psych: Alert, appropriate and with normal affect.   LABORATORY DATA:  EKG:  EKG {ACTION; IS/IS [id number]} ordered today. This demonstrates ***.  Lab Results  Component Value Date   WBC  6.4 07/26/2016   HGB 14.9 07/26/2016   HCT 43.3 07/26/2016   PLT 236 07/26/2016   GLUCOSE 111 (H) 07/26/2016   CHOL 189 06/25/2016   TRIG 191 (H) 06/25/2016   HDL 49 06/25/2016   LDLCALC 102 (H) 06/25/2016   ALT 22 06/25/2016   AST 21 06/25/2016   NA 140 07/26/2016   K 4.1 07/26/2016   CL 106 07/26/2016   CREATININE 1.11 07/26/2016   BUN 18 07/26/2016   CO2 27 07/26/2016   TSH 2.476 01/23/2012   INR 0.96 01/23/2014   HGBA1C 5.6 01/24/2012     BNP (last 3 results) No results for input(s): BNP in the last 8760 hours.  ProBNP (last 3 results) No results for input(s): PROBNP in the last 8760 hours.   Other Studies Reviewed Today:  CARDIAC CATH 01/2012 DATA:   The left main coronary artery is widely patent.  The left anterior descending artery is contains proximal/ostial segmental 50% stenosis. This region is somewhat suggestive of recanalized thrombus. This region is slightly distal to the origin of a large first diagonal. It is non-flow-limiting. In the distal third, the LAD is totally occluded near the apex. Faint left to left collaterals are noted in the apical segment no right to left collaterals are noted..  The left circumflex artery is gives origin to one obtuse marginal that bifurcates on the lateral wall and is free of significant obstruction.  The ramus intermedius branch is free of any significant obstruction  The right coronary artery is dominant. The PDA supplies the inferoapical segment. No significant obstruction is noted. 2 large left ventricular branches arise from the distal right coronary to.  LEFT VENTRICULOGRAM:  Left ventricular angiogram was done in the 30 RAO projection and revealed apical akinesis with EF of 50%. Poor visualization was obtained due to ventricular ectopy.  PERCUTANEOUS CORONARY INTERVENTION: The distal LAD before the apex was totally occluded. We were able to successfully advance a guidewire into the apical segment and to the  inferior portion of the apex. We did overlapping balloon inflations throughout the distal segment aiming to recanalize this region. However, this was unsuccessful. In the cath lab the patient was experiencing no chest discomfort. Given the limited distribution of this vessel further efforts were aborted. I chose not to do aspiration thrombectomy for fear that to aggressive manipulation of the proximal/mid LAD may worsen the appearance of this region.  IMPRESSIONS:  1. Acute coronary syndrome with total occlusion of the apical segment of the distal LAD.  2. Angioplasty was unsuccessful at recanalizing the apical segment of the LAD. Stenting and catheter-based thrombectomy was aborted because of technical and clinical issues including the small vessel caliber, distal location, and moderate proximal LAD disease.  3. Patent ramus, circumflex, and right coronary. Moderate proximal to mid LAD disease  approximately 50% stenosed. It is still possible that the proximal disease was the culprit for the ACS and the distal lesion could be embolized thrombus.  4. Apical wall motion abnormality do to LAD occlusion   RECOMMENDATION:  1. Bivalirudin infusion at 0.25 mg per kilogram per hour for 2 hours.  2. Brilinta  3. Beta blocker therapy  4. Statin therapy, glycemic control, and aggressive blood pressure control.  5. Lawrence likely be in the hospital for 72-96 hours do to absence of reperfusion and the remote possibility of myocardial rupture.   6. Echocardiogram to look for apical thrombus. I'm a.m. We decide to use antithrombotic therapy for 6-8 weeks because of the infarct location to      Electronically signed by Lesleigh NoeSmith, Henry W III, MD at 01/23/2012 11:26 AM     ASSESSMENT AND PLAN:  1.  CAD with hx of MI and failed PTCA to LAD.  No further pain  No SOB. Follow up with Dr. Katrinka BlazingSmith in 1 year.  He was supposed to have ETT prior to this visit. This was not done.  Lawrence arrange for pt to have  done just to check his status of CAD as per Dr. Katrinka BlazingSmith.      2.  HLD   3.   OSA   4.   HTN controlled continue current meds.   5.  COVID-19 Education: The signs and symptoms of COVID-19 were discussed with the patient and how to seek care for testing (follow up with PCP or arrange E-visit).  The importance of social distancing, staying at home, hand hygiene and wearing a mask when out in public were discussed today.  Current medicines are reviewed with the patient today.  The patient does not have concerns regarding medicines other than what has been noted above.  The following changes have been made:  See above.  Labs/ tests ordered today include:   No orders of the defined types were placed in this encounter.    Disposition:   FU with *** in {gen number 1-61:[id number]}0-10:310397} {Days to years:10300}.   Patient is agreeable to this plan and Lawrence call if any problems develop in the interim.   SignedNorma Fredrickson: Tinzley Dalia, NP  09/27/2018 8:35 AM  [facility] [address] [place], [place]  [address] Phone: [phone](336) [phone] Fax: [phone](336) [phone]

## 2018-09-27 ENCOUNTER — Ambulatory Visit: Payer: Self-pay | Admitting: Nurse Practitioner

## 2018-10-09 ENCOUNTER — Other Ambulatory Visit: Payer: Self-pay | Admitting: Physician Assistant

## 2018-10-10 NOTE — Progress Notes (Signed)
Cardiology Office Note   Date:  10/11/2018   ID:  Lawrence [name], DOB [birth year], MRN [id number]  PCP:  Andi DevonShelton, Kimberly, MD  Cardiologist:  Dr. Katrinka BlazingSmith    Chief Complaint  Patient presents with  . Coronary Artery Disease      History of Present Illness: Lawrence BonnetCharles E Aguinaldo is a 68 y.o. male who presents for CAD  He has ahistory of CAD,s/p NSTEMI (apical infarct) in 1/2014s/p unsuccessful POBAof the distal LAD, subsequentapical wall motion abnormality and was treated with anticoagulation for 6 weeks post MI, OSAon CPAP, HTN,andHLD   Follow-up treadmill test was normal. Plavix was stopped.He had done quite well since.  Last visit he had no chest pain and no SOB.  Feels well.  Is active tries to eat healthy.   He belives Dr. Renae GlossShelton has check lipids recently, but he feels great.  did not do stres test.    Today he has no chest pain or SOB.  No lightheadedness or dizziness. He has decreased exercise due to COVID, does try to eat healthy.  He has been doing well. His lipids are followed by Dr. Renae GlossShelton.  We do not have access to see labs.  He is thinking about flu vaccine but Lawrence arrange with Dr. Renae GlossShelton.  He stated he forgot his stress test. Agrees to do this year.    Past Medical History:  Diagnosis Date  . Anxiety   . Arthritis    hips  . CAD (coronary artery disease)    a. NSTEMI 1/14 - LHC: Ostial/proximal LAD 50%, distal LAD occluded near the apex with faint left to left collaterals, EF 50% with apical akinesis.;  b. Echo 1/14: Severe LVH, EF 50-55%, apical HK, mild LAE;  c. ETT-Myoview 3/14: EF 69%, apical scar, no ischemia;   d. ETT 12/15: no ST ischemic ST changes   . Coronary artery disease involving native heart 11/29/2013   Occluded apical LAD. 50% proximal to mid LAD. Apical infarct January 2014.   Marland Kitchen. GERD (gastroesophageal reflux disease)    OTC  . History of MI (myocardial infarction)   . HTN (hypertension) 01/23/2012  . Hyperlipidemia 01/24/2012   LDL >  160 on admission   . Hyperlipidemia LDL goal < 70   . Hypertension   . Left ventricular aneurysm   . NSTEMI. old. 01/23/2012  . Obstructive sleep apnea 01/26/2012   Greater than 20 year history   . Sleep apnea    on CPAP    Past Surgical History:  Procedure Laterality Date  . CARDIAC CATHETERIZATION    . CORONARY STENT PLACEMENT    . HERNIA REPAIR Left    inguinal   . LEFT HEART CATHETERIZATION WITH CORONARY ANGIOGRAM N/A 01/23/2012   Procedure: LEFT HEART CATHETERIZATION WITH CORONARY ANGIOGRAM;  Surgeon: Lesleigh NoeHenry W Smith III, MD;  [facility];  Service: Cardiovascular;  Laterality: N/A;  . TOTAL HIP ARTHROPLASTY Right 2018     Current Outpatient Medications  Medication Sig Dispense Refill  . ALPRAZolam (XANAX) 0.25 MG tablet Take 1 tablet (0.25 mg total) by mouth at bedtime as needed for anxiety. 5 tablet 0  . Ascorbic Acid (VITAMIN C) 1000 MG tablet Take 1,000 mg by mouth daily.    Marland Kitchen. aspirin EC 81 MG tablet Take 81 mg by mouth every evening.     Marland Kitchen. atorvastatin (LIPITOR) 80 MG tablet TAKE 1/2 TAB BY MOUTH EVERY DAY AT 6PM,  - pt must keep upcoming appt in Sept for further  refills. 45 tablet 0  . Cholecalciferol (VITAMIN D3) 1000 UNITS CAPS Take 4,000 Units by mouth daily.     Marland Kitchen co-enzyme Q-10 50 MG capsule Take 50 mg by mouth every evening.     . hydrochlorothiazide (MICROZIDE) 12.5 MG capsule TAKE 1 CAPSULE BY MOUTH EVERY DAY 90 capsule 3  . lisinopril (PRINIVIL,ZESTRIL) 10 MG tablet TAKE 1 TABLET BY MOUTH DAILY. 30 tablet 11  . metoprolol succinate (TOPROL-XL) 50 MG 24 hr tablet TAKE 1/2 TABLET ONCE DAILY NEEDS OFFICE VISIT 15 tablet 0  . Multiple Vitamin (MULTIVITAMIN WITH MINERALS) TABS tablet Take 1 tablet by mouth daily.    . nitroGLYCERIN (NITROSTAT) 0.4 MG SL tablet PLACE 1 TABLET (0.4 MG TOTAL) UNDER THE TONGUE EVERY 5 (FIVE) MINUTES AS NEEDED FOR CHEST PAIN. 25 tablet 5   No current facility-administered medications for this visit.     Allergies:   Patient has  no known allergies.    Social History:  The patient  reports that he quit smoking about 6 years ago. His smoking use included cigars. He has never used smokeless tobacco. He reports that he does not drink alcohol or use drugs.   Family History:  The patient's family history includes Hypertension in his mother.    ROS:  General:no colds or fevers, no weight changes Skin:no rashes or ulcers HEENT:no blurred vision, no congestion CV:see HPI PUL:see HPI GI:no diarrhea constipation or melena, no indigestion GU:no hematuria, no dysuria MS:no joint pain, no claudication Neuro:no syncope, no lightheadedness Endo:no diabetes, no thyroid disease  Wt Readings from Last 3 Encounters:  10/11/18 234 lb 12.8 oz (106.5 kg)  09/15/17 234 lb (106.1 kg)  11/18/16 237 lb (107.5 kg)     PHYSICAL EXAM: VS:  BP 118/70   Pulse 84   Ht 5\' 10"  (1.778 m)   Wt 234 lb 12.8 oz (106.5 kg)   SpO2 95%   BMI 33.69 kg/m  , BMI Body mass index is 33.69 kg/m. General:Pleasant affect, NAD Skin:Warm and dry, brisk capillary refill HEENT:normocephalic, sclera clear, mucus membranes moist Neck:supple, no JVD, no bruits  Heart:S1S2 RRR without murmur, gallup, rub or click Lungs:clear without rales, rhonchi, or wheezes , non tender, + BS, do not palpate liver spleen or masses Ext:no lower ext edema, 2+ pedal pulses, 2+ radial pulses Neuro:alert and oriented X 3, MAE, follows commands, + facial symmetry    EKG:  EKG is ordered today. The ekg ordered today demonstrates SR old inf and ant MI per EKG.  No new changes     Recent Labs: No results found for requested labs within last 8760 hours.    Lipid Panel    Component Value Date/Time   CHOL 189 06/25/2016 1113   TRIG 191 (H) 06/25/2016 1113   HDL 49 06/25/2016 1113   CHOLHDL 3.9 06/25/2016 1113   CHOLHDL 4 12/20/2013 0840   VLDL 22.6 12/20/2013 0840   LDLCALC 102 (H) 06/25/2016 1113       Other studies Reviewed: Additional studies/  records that were reviewed today include:  TTE 01/2012 . Study Conclusions   - Left ventricle: Wall thickness was increased in a pattern  of severe LVH. Systolic function was normal. The estimated  ejection fraction was in the range of 50% to 55%. There is  severe hypokinesis of the apical myocardium.  - Left atrium: The atrium was mildly dilated.   ASSESSMENT AND PLAN:  1.  CAD with failed PTCA to LAD no angina or SOB.  Will do  lexiscan myoview to eval for ischemia.  Follow up in 1 year with Dr. Tamala Julian unless myoview abnormal.   2.  HLD per PCP goal LDL < 70  3.  HTN controlled continue current meds  4.  OSA with cpap stable.      Current medicines are reviewed with the patient today.  The patient Has no concerns regarding medicines.  The following changes have been made:  See above Labs/ tests ordered today include:see above  Disposition:   FU:  see above  Signed, Cecilie Kicks, NP  10/11/2018 9:54 AM    [address], [place], [place] Phone: [phone]; Fax: [phone]

## 2018-10-11 ENCOUNTER — Encounter: Payer: Self-pay | Admitting: *Deleted

## 2018-10-11 ENCOUNTER — Other Ambulatory Visit: Payer: Self-pay

## 2018-10-11 ENCOUNTER — Encounter: Payer: Self-pay | Admitting: Cardiology

## 2018-10-11 ENCOUNTER — Ambulatory Visit (INDEPENDENT_AMBULATORY_CARE_PROVIDER_SITE_OTHER): Payer: Medicare Other | Admitting: Cardiology

## 2018-10-11 VITALS — BP 118/70 | HR 84 | Ht 70.0 in | Wt 234.8 lb

## 2018-10-11 DIAGNOSIS — I2510 Atherosclerotic heart disease of native coronary artery without angina pectoris: Secondary | ICD-10-CM

## 2018-10-11 DIAGNOSIS — E782 Mixed hyperlipidemia: Secondary | ICD-10-CM | POA: Diagnosis not present

## 2018-10-11 DIAGNOSIS — I10 Essential (primary) hypertension: Secondary | ICD-10-CM

## 2018-10-11 DIAGNOSIS — G4733 Obstructive sleep apnea (adult) (pediatric): Secondary | ICD-10-CM

## 2018-10-11 NOTE — Patient Instructions (Addendum)
Medication Instructions:  Your physician recommends that you continue on your current medications as directed. Please refer to the Current Medication list given to you today.  If you need a refill on your cardiac medications before your next appointment, please call your pharmacy.   Lab work: None ordered  If you have labs (blood work) drawn today and your tests are completely normal, you will receive your results only by: Marland Kitchen MyChart Message (if you have MyChart) OR . A paper copy in the mail If you have any lab test that is abnormal or we need to change your treatment, we will call you to review the results.  Testing/Procedures: Your physician has requested that you have a lexiscan myoview. For further information please visit [email or web address] Please follow instruction sheet, as given.    Follow-Up: At [facility], you and your health needs are our priority.  As part of our continuing mission to provide you with exceptional heart care, we have created designated Provider Care Teams.  These Care Teams include your primary Cardiologist (physician) and Advanced Practice Providers (APPs -  Physician Assistants and Nurse Practitioners) who all work together to provide you with the care you need, when you need it. You will need a follow up appointment in 12 months.  Please call our office 2 months in advance to schedule this appointment.  You may see [name], MD or one of the following Advanced Practice Providers on your designated Care Team:   Norma Fredrickson, NP Nada Boozer, NP . Georgie Chard, NP  Any Other Special Instructions Will Be Listed Below (If Applicable).  Cardiac Nuclear Scan A cardiac nuclear scan is a test that is done to check the flow of blood to your heart. It is done when you are resting and when you are exercising. The test looks for problems such as:  Not enough blood reaching a portion of the heart.  The heart muscle not working as it should. You may  need this test if:  You have heart disease.  You have had lab results that are not normal.  You have had heart surgery or a balloon procedure to open up blocked arteries (angioplasty).  You have chest pain.  You have shortness of breath. In this test, a special dye (tracer) is put into your bloodstream. The tracer will travel to your heart. A camera will then take pictures of your heart to see how the tracer moves through your heart. This test is usually done at a hospital and takes 2-4 hours. Tell a doctor about:  Any allergies you have.  All medicines you are taking, including vitamins, herbs, eye drops, creams, and over-the-counter medicines.  Any problems you or family members have had with anesthetic medicines.  Any blood disorders you have.  Any surgeries you have had.  Any medical conditions you have.  Whether you are pregnant or may be pregnant. What are the risks? Generally, this is a safe test. However, problems may occur, such as:  Serious chest pain and heart attack. This is only a risk if the stress portion of the test is done.  Rapid heartbeat.  A feeling of warmth in your chest. This feeling usually does not last long.  Allergic reaction to the tracer. What happens before the test?  Ask your doctor about changing or stopping your normal medicines. This is important.  Follow instructions from your doctor about what you cannot eat or drink.  Remove your jewelry on the day of the  test. What happens during the test?  An IV tube will be inserted into one of your veins.  Your doctor will give you a small amount of tracer through the IV tube.  You will wait for 20-40 minutes while the tracer moves through your bloodstream.  Your heart will be monitored with an electrocardiogram (ECG).  You will lie down on an exam table.  Pictures of your heart will be taken for about 15-20 minutes.  You may also have a stress test. For this test, one of these things  may be done: ? You will be asked to exercise on a treadmill or a stationary bike. ? You will be given medicines that will make your heart work harder. This is done if you are unable to exercise.  When blood flow to your heart has peaked, a tracer will again be given through the IV tube.  After 20-40 minutes, you will get back on the exam table. More pictures will be taken of your heart.  Depending on the tracer that is used, more pictures may need to be taken 3-4 hours later.  Your IV tube will be removed when the test is over. The test may vary among doctors and hospitals. What happens after the test?  Ask your doctor: ? Whether you can return to your normal schedule, including diet, activities, and medicines. ? Whether you should drink more fluids. This will help to remove the tracer from your body. Drink enough fluid to keep your pee (urine) pale yellow.  Ask your doctor, or the department that is doing the test: ? When will my results be ready? ? How will I get my results? Summary  A cardiac nuclear scan is a test that is done to check the flow of blood to your heart.  Tell your doctor whether you are pregnant or may be pregnant.  Before the test, ask your doctor about changing or stopping your normal medicines. This is important.  Ask your doctor whether you can return to your normal activities. You may be asked to drink more fluids. This information is not intended to replace advice given to you by your health care provider. Make sure you discuss any questions you have with your health care provider. Document Released: 06/13/2017 Document Revised: 04/19/2018 Document Reviewed: 06/13/2017 Elsevier Patient Education  2020 [employer].

## 2018-10-16 ENCOUNTER — Other Ambulatory Visit: Payer: Self-pay | Admitting: Interventional Cardiology

## 2018-10-16 ENCOUNTER — Telehealth (HOSPITAL_COMMUNITY): Payer: Self-pay | Admitting: *Deleted

## 2018-10-16 NOTE — Telephone Encounter (Signed)
Patient given detailed instructions per Myocardial Perfusion Study Information Sheet for the test on 10/18/18 at 8:00. Patient notified to arrive 15 minutes early and that it is imperative to arrive on time for appointment to keep from having the test rescheduled.  If you need to cancel or reschedule your appointment, please call the office within 24 hours of your appointment. . Patient verbalized understanding.Lawrence [name]

## 2018-10-18 ENCOUNTER — Ambulatory Visit (HOSPITAL_COMMUNITY): Payer: Medicare Other | Attending: Cardiology

## 2018-10-18 ENCOUNTER — Other Ambulatory Visit: Payer: Self-pay

## 2018-10-18 DIAGNOSIS — I2510 Atherosclerotic heart disease of native coronary artery without angina pectoris: Secondary | ICD-10-CM | POA: Diagnosis not present

## 2018-10-18 LAB — MYOCARDIAL PERFUSION IMAGING
LV dias vol: 96 mL (ref 62–150)
LV sys vol: 38 mL
Peak HR: 85 {beats}/min
Rest HR: 66 {beats}/min
SDS: 4
SRS: 7
SSS: 11
TID: 1.05

## 2018-10-18 MED ORDER — TECHNETIUM TC 99M TETROFOSMIN IV KIT
10.2000 | PACK | Freq: Once | INTRAVENOUS | Status: AC | PRN
  Administered 2018-10-18: 10.2 via INTRAVENOUS
  Filled 2018-10-18: qty 11

## 2018-10-18 MED ORDER — REGADENOSON 0.4 MG/5ML IV SOLN
0.4000 mg | Freq: Once | INTRAVENOUS | Status: AC
  Administered 2018-10-18: 0.4 mg via INTRAVENOUS

## 2018-10-18 MED ORDER — TECHNETIUM TC 99M TETROFOSMIN IV KIT
32.5000 | PACK | Freq: Once | INTRAVENOUS | Status: AC | PRN
  Administered 2018-10-18: 32.5 via INTRAVENOUS
  Filled 2018-10-18: qty 33

## 2018-10-19 ENCOUNTER — Telehealth: Payer: Self-pay | Admitting: *Deleted

## 2018-10-19 NOTE — Telephone Encounter (Signed)
-----   Message from Isaiah Serge, NP sent at 10/19/2018 11:07 AM EDT ----- Normal stress test.  Pump action of the heart is normal.  Excellent study.  Follow up in 1 year with Dr. Tamala Julian

## 2018-10-19 NOTE — Telephone Encounter (Signed)
Call placed to pt re: stress test results, left a message for pt to call back. 

## 2018-11-04 ENCOUNTER — Other Ambulatory Visit: Payer: Self-pay | Admitting: Interventional Cardiology

## 2019-03-20 IMAGING — US US EXTREM LOW VENOUS*R*
1 series · 14 of 24 positions shown · non-contrast
Comparison: None.

CLINICAL DATA: 65-year-old male with right lower extremity pain and
swelling for 4 days.



[Series 1: us extrem low venous*right* · 0.08mm/px · 14 of 35 slices shown]
[im 1/35]
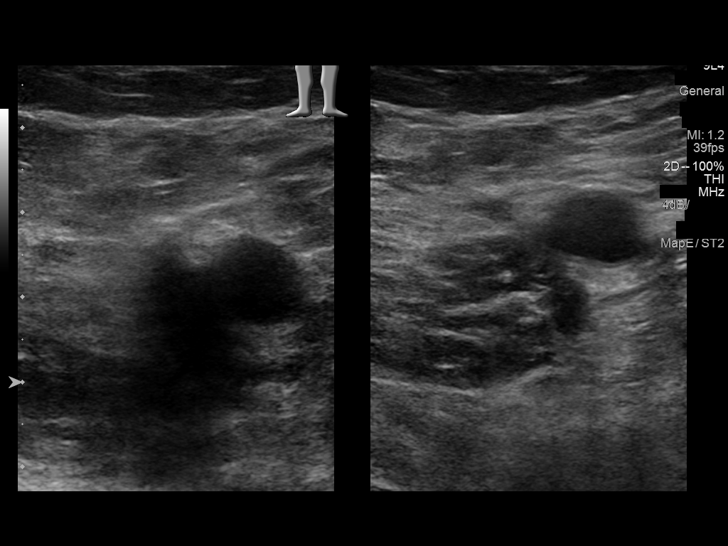
[im 3/35]
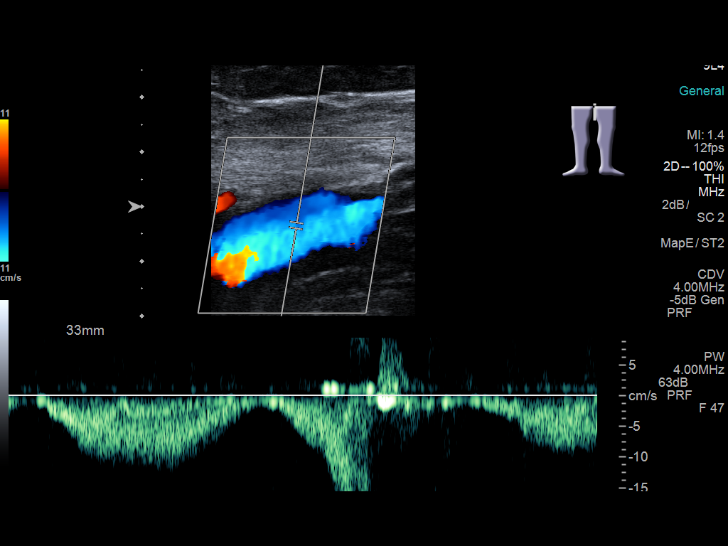
[im 6/35]
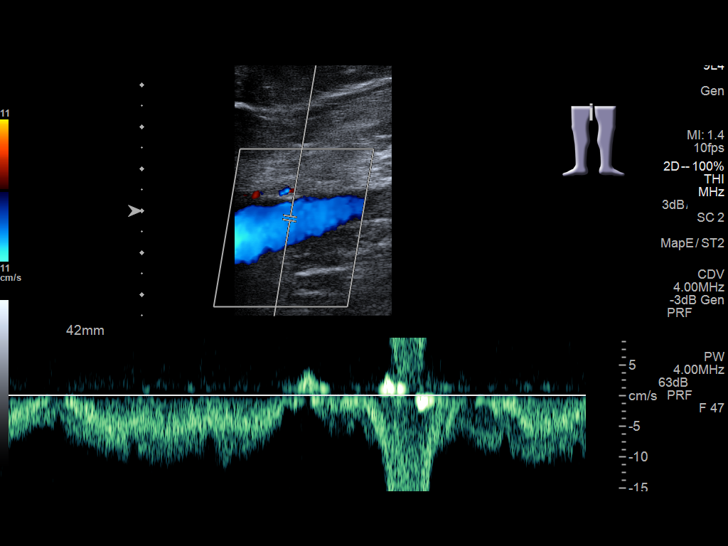
[im 9/35]
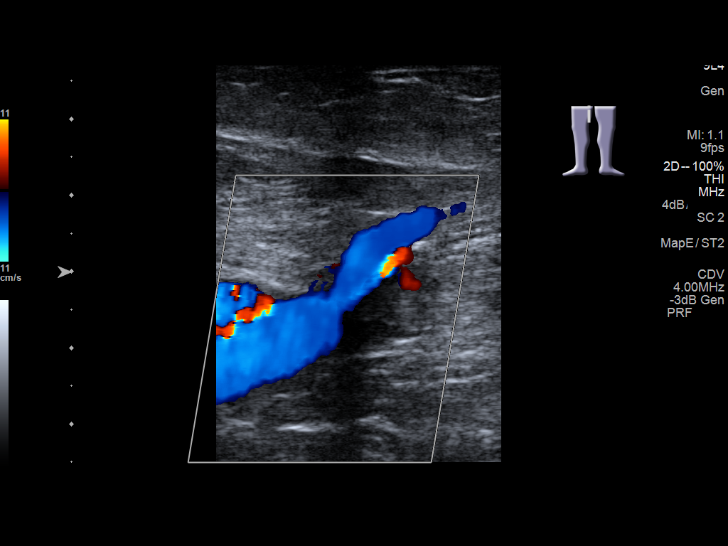
[im 11/35]
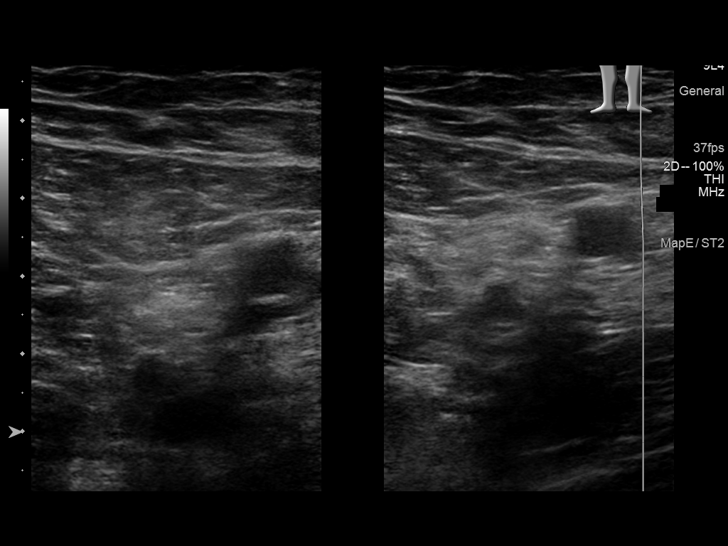
[im 14/35]
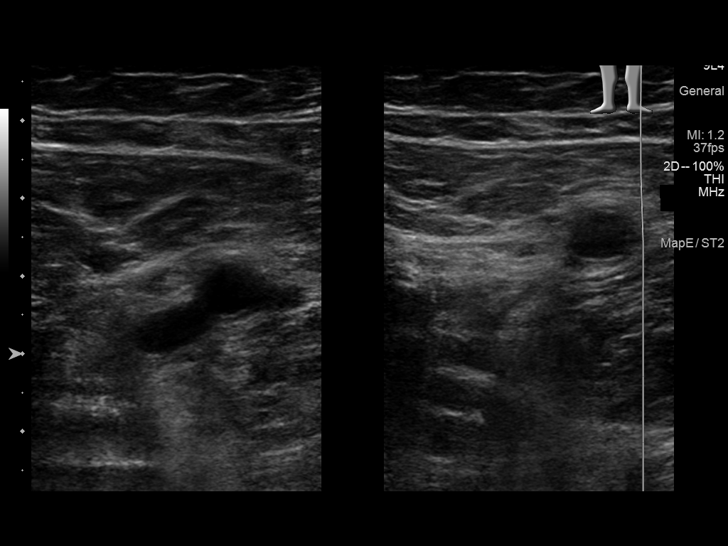
[im 17/35]
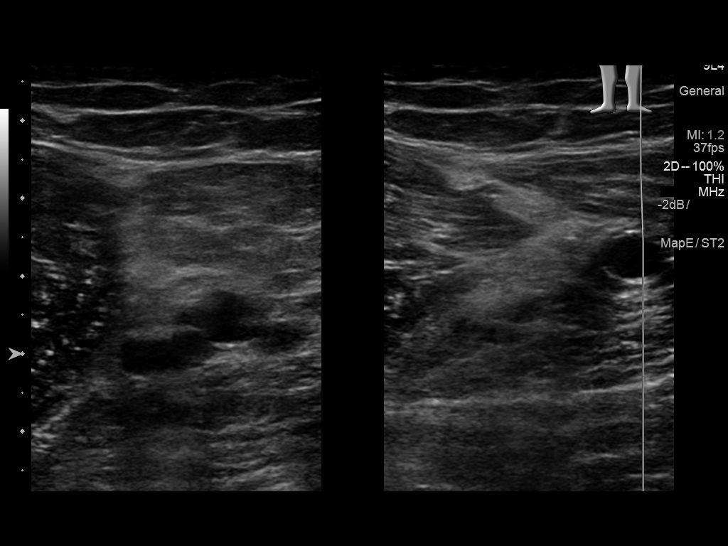
[im 18/35]
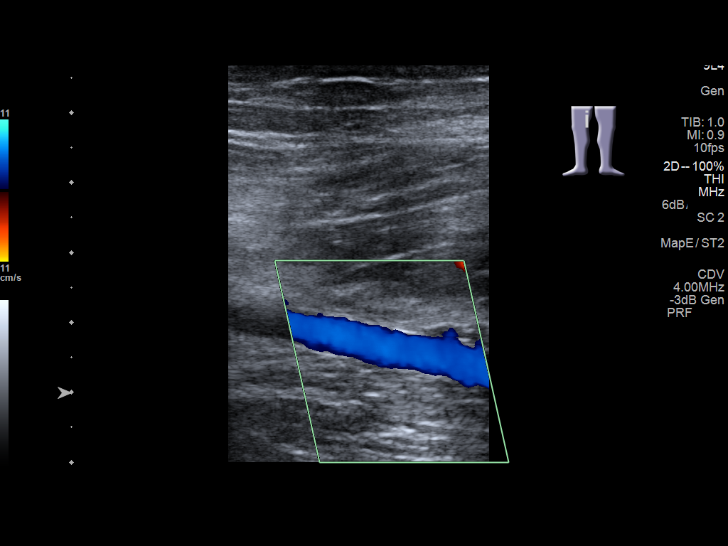
[im 21/35]
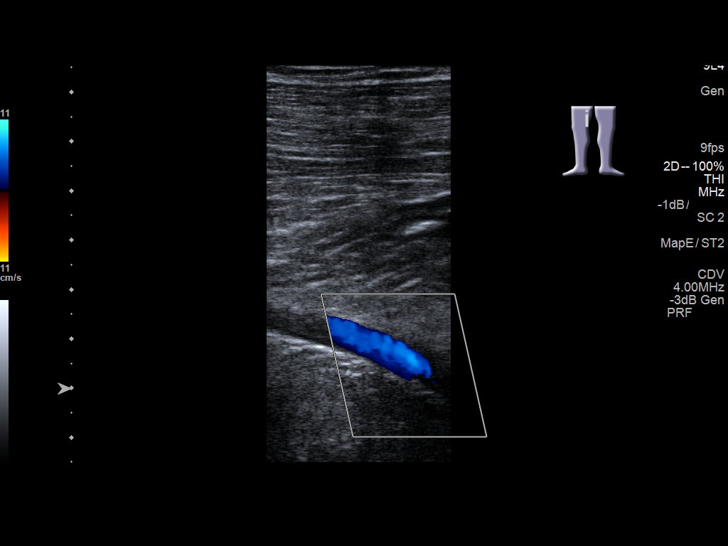
[im 24/35]
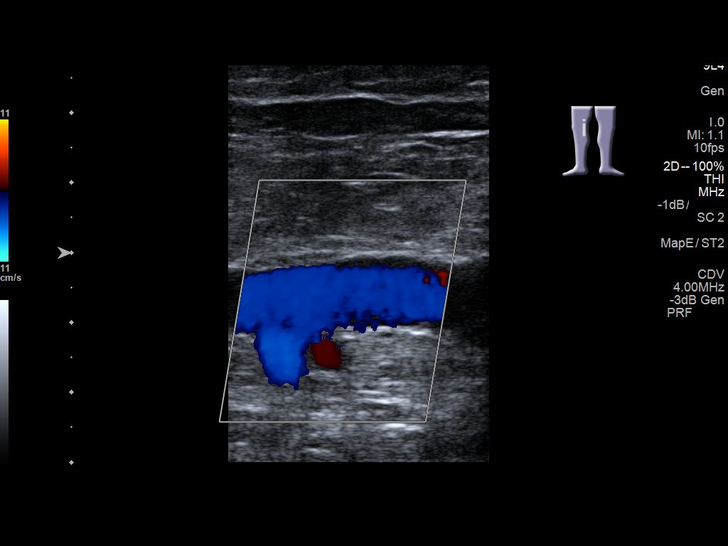
[im 27/35]
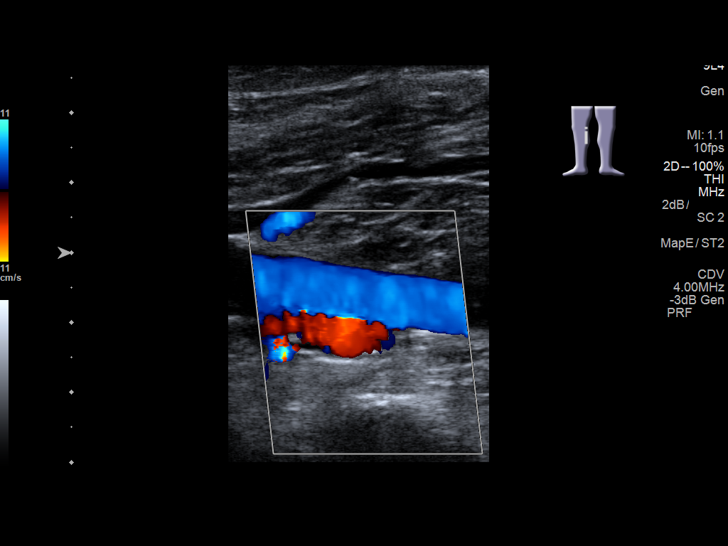
[im 29/35]
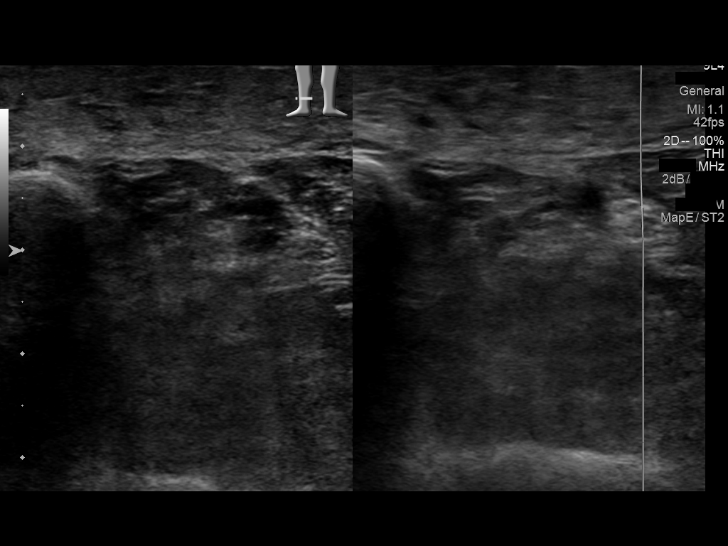
[im 32/35]
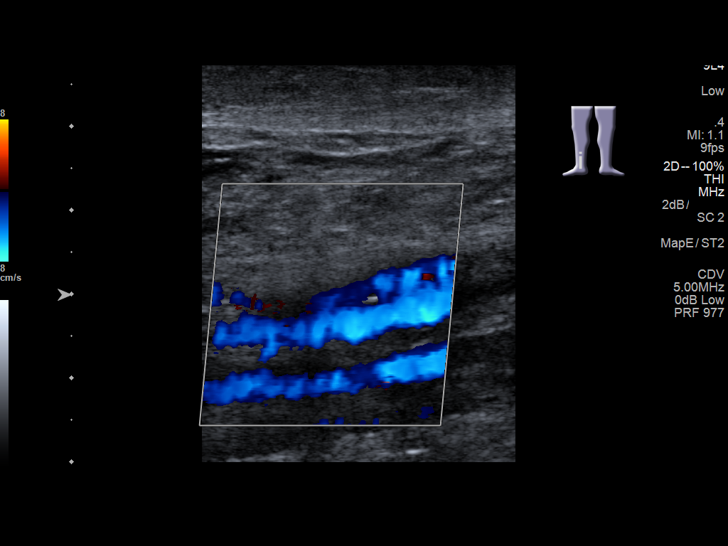
[im 35/35]
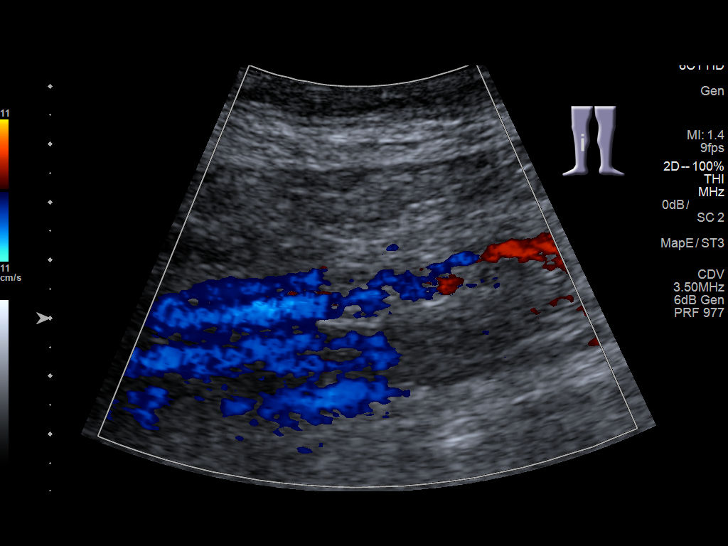

[14 of 24 positions shown; findings below may reference images not displayed]

FINDINGS: Normal flow, compressibility, and augmentation within the distal
common femoral, proximal profunda femoral, proximal greater
saphenous, entire femoral, popliteal veins, and imaged calf veins.
IMPRESSION: No evidence of DVT within the right lower extremity.

## 2019-04-08 ENCOUNTER — Other Ambulatory Visit: Payer: Self-pay | Admitting: Physician Assistant

## 2019-06-01 ENCOUNTER — Other Ambulatory Visit: Payer: Self-pay | Admitting: Internal Medicine

## 2019-06-01 DIAGNOSIS — M25561 Pain in right knee: Secondary | ICD-10-CM

## 2019-07-03 ENCOUNTER — Telehealth: Payer: Self-pay | Admitting: *Deleted

## 2019-07-03 NOTE — Telephone Encounter (Signed)
   [facility] Pre-operative Risk Assessment    HEARTCARE STAFF: - Please ensure there is not already an duplicate clearance open for this procedure. - Under Visit Info/Reason for Call, type in Other and utilize the format Clearance MM/DD/YY or Clearance TBD. Do not use dashes or single digits. - If request is for dental extraction, please clarify the # of teeth to be extracted.  Request for surgical clearance:  1. What type of surgery is being performed? RIGHT KNEE ARTHROPLASTY   2. When is this surgery scheduled? TBD   3. What type of clearance is required (medical clearance vs. Pharmacy clearance to hold med vs. Both)? MEDICAL  4. Are there any medications that need to be held prior to surgery and how long? ASA    5. Practice name and name of physician performing surgery? [facility]; DR. JEFFREY BEANE   6. What is the office phone number? [phone]   7.   What is the office fax number? [phone]  8.   Anesthesia type (None, local, MAC, general) ? LEFT VERBAL MESSAGE FOR SURGERY SCHEDULER TO CALL BACK WITH TYPE OF ANESTHESIA   Julaine Hua 07/03/2019, 10:30 AM  _________________________________________________________________   (provider comments below)

## 2019-07-03 NOTE — Telephone Encounter (Signed)
Pt is agreeable to plan of care for pre op appt. Pt has been scheduled to see Georgie Chard, [facility] 07/09/19 @ 9:15 for pre op assessment. Pt thanked me for the help. I will forward clearance notes to [facility] for upcoming appt. I will remove from the pre op call back pool.

## 2019-07-03 NOTE — Telephone Encounter (Signed)
   Primary Cardiologist:Henry Malissa Hippo III, MD  Chart reviewed as part of pre-operative protocol coverage. Because of Lawrence [name]'s past medical history and time since last visit, they will require a follow-up visit in order to better assess preoperative cardiovascular risk.  Pre-op covering staff: - Please schedule appointment and call patient to inform them. If patient already had an upcoming appointment within acceptable timeframe, please add "pre-op clearance" to the appointment notes so provider is aware. - Please contact requesting surgeon's office via preferred method (i.e, phone, fax) to inform them of need for appointment prior to surgery.  If applicable, this message will also be routed to pharmacy pool and/or primary cardiologist for input on holding anticoagulant/antiplatelet agent as requested below so that this information is available to the clearing provider at time of patient's appointment.   [place], [place]  07/03/2019, 11:14 AM

## 2019-07-07 NOTE — Progress Notes (Addendum)
Cardiology Office Note   Date:  07/09/2019   ID:  Lawrence [name], DOB [birth year], MRN [id number]  PCP:  Lawrence Devon, MD  Cardiologist: Dr. Verdis Prime, MD  Chief Complaint  Patient presents with  . Pre-op Exam    History of Present Illness: Lawrence [name] is a 69 y.o. male who presents for preoperative clearance prior to right knee arthroplasty, seen for Dr. Katrinka [name].  [name] has a history of CAD s/p non-STEMI with apical infarct 01/2012 s/p unsuccessful POBA (failed PTCA) of the distal LAD, subsequent apical wall motion abnormality which was treated with anticoagulation for 6 weeks post MI.  Also history of OSA on CPAP, hypertension and hyperlipidemia  Follow-up treadmill stress test was found to be normal therefore Plavix was stopped. Last echocardiogram 01/2012 with LVEF at 50 to 55% with severe hypokinesis of the apical myocardium and severe LVH.  Plan at last follow-up 10/11/2018 was for [facility] stress test given duration of time since previous assessment.  Lexiscan stress test performed 10/18/2018 which was found to be normal with no ST segment deviation during stress, LVEF normal at 55 to 65% with no evidence of infarct or ischemia.  Today Mr. Lawrence [name] states that he has been doing very well from a CV standpoint. Denies chest pain, SOB, palpitations, PND, LE edema, orthopnea, dizziness or syncope. Reports that he is ready for his procedure to get back to playing golf. He does not have any unstable cardiac conditions.  Upon evaluation today, he can achieve 4 METs or greater without anginal symptoms. According to [facility] and AHA guidelines, he requires no further cardiac workup prior to his noncardiac surgery and should be at acceptable risk. His revised cardiac risk index of peri-procedural MI or cardiac arrest following surgery is low.    Past Medical History:  Diagnosis Date  . Anxiety   . Arthritis    hips  . CAD (coronary artery disease)    a. NSTEMI 1/14 - LHC:  Ostial/proximal LAD 50%, distal LAD occluded near the apex with faint left to left collaterals, EF 50% with apical akinesis.;  b. Echo 1/14: Severe LVH, EF 50-55%, apical HK, mild LAE;  c. ETT-Myoview 3/14: EF 69%, apical scar, no ischemia;   d. ETT 12/15: no ST ischemic ST changes   . Coronary artery disease involving native heart 11/29/2013   Occluded apical LAD. 50% proximal to mid LAD. Apical infarct January 2014.   Marland Kitchen GERD (gastroesophageal reflux disease)    OTC  . History of MI (myocardial infarction)   . HTN (hypertension) 01/23/2012  . Hyperlipidemia 01/24/2012   LDL > 160 on admission   . Hyperlipidemia LDL goal < 70   . Hypertension   . Left ventricular aneurysm   . NSTEMI. old. 01/23/2012  . Obstructive sleep apnea 01/26/2012   Greater than 20 year history   . Sleep apnea    on CPAP    Past Surgical History:  Procedure Laterality Date  . CARDIAC CATHETERIZATION    . CORONARY STENT PLACEMENT    . HERNIA REPAIR Left    inguinal   . LEFT HEART CATHETERIZATION WITH CORONARY ANGIOGRAM N/A 01/23/2012   Procedure: LEFT HEART CATHETERIZATION WITH CORONARY ANGIOGRAM;  Surgeon: Lawrence Noe, MD;  Location: [facility] CATH LAB;  Service: Cardiovascular;  Laterality: N/A;  . TOTAL HIP ARTHROPLASTY Right 2018     Current Outpatient Medications  Medication Sig Dispense Refill  . ALPRAZolam (XANAX) 0.25 MG tablet Take 1 tablet (  0.25 mg total) by mouth at bedtime as needed for anxiety. 5 tablet 0  . Ascorbic Acid (VITAMIN C) 1000 MG tablet Take 1,000 mg by mouth daily.    Marland Kitchen aspirin EC 81 MG tablet Take 81 mg by mouth every evening.     Marland Kitchen atorvastatin (LIPITOR) 80 MG tablet TAKE 1/2 TAB BY MOUTH EVERY DAY AT 6PM, - PT MUST KEEP UPCOMING APPT IN SEPT FOR FURTHER REFILLS. 45 tablet 1  . Cholecalciferol (VITAMIN D3) 1000 UNITS CAPS Take 4,000 Units by mouth daily.     Marland Kitchen co-enzyme Q-10 50 MG capsule Take 50 mg by mouth every evening.     . hydrochlorothiazide (MICROZIDE) 12.5 MG capsule TAKE 1  CAPSULE BY MOUTH EVERY DAY 90 capsule 3  . lisinopril (ZESTRIL) 10 MG tablet TAKE 1 TABLET BY MOUTH DAILY. 30 tablet 11  . metoprolol succinate (TOPROL-XL) 50 MG 24 hr tablet TAKE 1 TABLET BY MOUTH DAILY. KEEP UP COMING OFFICE VISIT 90 tablet 3  . nitroGLYCERIN (NITROSTAT) 0.4 MG SL tablet PLACE 1 TABLET (0.4 MG TOTAL) UNDER THE TONGUE EVERY 5 (FIVE) MINUTES AS NEEDED FOR CHEST PAIN. 25 tablet 5  . traMADol (ULTRAM) 50 MG tablet Take 50 mg by mouth as needed.     No current facility-administered medications for this visit.    Allergies:   Patient has no known allergies.    Social History:  The patient  reports that he quit smoking about 7 years ago. His smoking use included cigars. He has never used smokeless tobacco. He reports that he does not drink alcohol and does not use drugs.   Family History:  The patient's family history includes Hypertension in his mother.    ROS:  Please see the history of present illness.   Otherwise, review of systems are positive for none.   All other systems are reviewed and negative.    PHYSICAL EXAM: VS:  BP 120/80   Pulse 91   Ht 5\' 10"  (1.778 m)   Wt 232 lb (105.2 kg)   SpO2 96%   BMI 33.29 kg/m  , BMI Body mass index is 33.29 kg/m.   General: Well developed, well nourished, NAD Neck: Negative for carotid bruits. No JVD Lungs:Clear to ausculation bilaterally. No wheezes, rales, or rhonchi. Breathing is unlabored. Cardiovascular: RRR with S1 S2. No murmurs Extremities: No edema. Radial  pulses 2+ bilaterally Neuro: Alert and oriented. No focal deficits. No facial asymmetry. MAE spontaneously. Psych: Responds to questions appropriately with normal affect.     EKG:  EKG is ordered today. The ekg ordered today demonstrates NSR with HR 91bpm and no acute ST-T wave changes.    Recent Labs: No results found for requested labs within last 8760 hours.    Lipid Panel    Component Value Date/Time   CHOL 189 06/25/2016 1113   TRIG 191 (H)  06/25/2016 1113   HDL 49 06/25/2016 1113   CHOLHDL 3.9 06/25/2016 1113   CHOLHDL 4 12/20/2013 0840   VLDL 22.6 12/20/2013 0840   LDLCALC 102 (H) 06/25/2016 1113     Wt Readings from Last 3 Encounters:  07/09/19 232 lb (105.2 kg)  10/18/18 234 lb (106.1 kg)  10/11/18 234 lb 12.8 oz (106.5 kg)     Other studies Reviewed: Additional studies/ records that were reviewed today include: Review of the above records demonstrates:   Lexiscan Myoview stress test 10/18/2018:   Nuclear stress EF: 60%.  There was no ST segment deviation noted during stress.  The study is normal.  This is a low risk study.  The left ventricular ejection fraction is normal (55-65%).   Normal pharmacologic nuclear stress test with no evidence for prior infarct or ischemia.   ASSESSMENT AND PLAN:  1.  CAD: -Failed PTCA to the LAD -Last seen 10/11/2018 with plans for Lexiscan Myoview performed 10/18/2018 which was found to be normal with no evidence of ischemia or infarct -Denies chest pain today   2.  HLD: -Lipid management per PCP with goal of <70 -Continue current regimen   3.  Hypertension: -Stable, 120/80 -Continue current regimen   4.  Preoperative cardiac evaluation: -The patient does not have any unstable cardiac conditions. Upon evaluation today, he can achieve 4 METs or greater without anginal symptoms.  According to [facility] and AHA guidelines, he requires no further cardiac workup prior to his noncardiac surgery and should be at acceptable risk. His revised cardiac risk of peri-procedural MI or cardiac arrest following surgery is low.  -He may hold ASA 3-5 days prior to procedure then resume when safe from a surgical standpoint.    Current medicines are reviewed at length with the patient today.  The patient does not have concerns regarding medicines.  The following changes have been made:  no change  Labs/ tests ordered today include: None   Orders Placed This Encounter  Procedures  .  EKG 12-Lead    Disposition:   FU with Dr. Tamala Julian in 1 year  Signed, Kathyrn Drown, NP  07/09/2019 9:47 AM    [address], [place], [place]  [address] Phone: [phone]; Fax: [phone]

## 2019-07-09 ENCOUNTER — Ambulatory Visit (INDEPENDENT_AMBULATORY_CARE_PROVIDER_SITE_OTHER): Payer: Medicare Other | Admitting: Cardiology

## 2019-07-09 ENCOUNTER — Encounter: Payer: Self-pay | Admitting: Cardiology

## 2019-07-09 ENCOUNTER — Ambulatory Visit: Payer: BLUE CROSS/BLUE SHIELD | Admitting: Cardiology

## 2019-07-09 ENCOUNTER — Other Ambulatory Visit: Payer: Self-pay

## 2019-07-09 VITALS — BP 120/80 | HR 91 | Ht 70.0 in | Wt 232.0 lb

## 2019-07-09 DIAGNOSIS — I10 Essential (primary) hypertension: Secondary | ICD-10-CM

## 2019-07-09 DIAGNOSIS — Z01810 Encounter for preprocedural cardiovascular examination: Secondary | ICD-10-CM

## 2019-07-09 DIAGNOSIS — I2510 Atherosclerotic heart disease of native coronary artery without angina pectoris: Secondary | ICD-10-CM

## 2019-07-09 DIAGNOSIS — G4733 Obstructive sleep apnea (adult) (pediatric): Secondary | ICD-10-CM | POA: Diagnosis not present

## 2019-07-09 DIAGNOSIS — E782 Mixed hyperlipidemia: Secondary | ICD-10-CM

## 2019-07-09 NOTE — Patient Instructions (Signed)
Medication Instructions:  Your physician recommends that you continue on your current medications as directed. Please refer to the Current Medication list given to you today.  *If you need a refill on your cardiac medications before your next appointment, please call your pharmacy*   Lab Work: None ordered  If you have labs (blood work) drawn today and your tests are completely normal, you will receive your results only by: . MyChart Message (if you have MyChart) OR . A paper copy in the mail If you have any lab test that is abnormal or we need to change your treatment, we will call you to review the results.   Testing/Procedures: None ordered   Follow-Up: At [facility], you and your health needs are our priority.  As part of our continuing mission to provide you with exceptional heart care, we have created designated Provider Care Teams.  These Care Teams include your primary Cardiologist (physician) and Advanced Practice Providers (APPs -  Physician Assistants and Nurse Practitioners) who all work together to provide you with the care you need, when you need it.  We recommend signing up for the patient portal called "MyChart".  Sign up information is provided on this After Visit Summary.  MyChart is used to connect with patients for Virtual Visits (Telemedicine).  Patients are able to view lab/test results, encounter notes, upcoming appointments, etc.  Non-urgent messages can be sent to your provider as well.   To learn more about what you can do with MyChart, go to [email or web address].    Your next appointment:   12 month(s)  The format for your next appointment:   In Person  Provider:   You may see [name], MD or one of the following Advanced Practice Providers on your designated Care Team:    Lori Gerhardt, NP  Laura Ingold, NP  Jill McDaniel, NP    Other Instructions   

## 2019-07-17 ENCOUNTER — Other Ambulatory Visit: Payer: Self-pay

## 2019-07-17 MED ORDER — NITROGLYCERIN 0.4 MG SL SUBL: 0.4000 mg | SUBLINGUAL_TABLET | SUBLINGUAL | 6 refills | Status: DC | PRN

## 2019-08-20 ENCOUNTER — Other Ambulatory Visit: Payer: Self-pay | Admitting: Interventional Cardiology

## 2019-09-10 ENCOUNTER — Encounter (HOSPITAL_COMMUNITY): Admission: RE | Admit: 2019-09-10 | Payer: Medicare Other | Source: Ambulatory Visit

## 2019-09-19 ENCOUNTER — Ambulatory Visit: Admit: 2019-09-19 | Payer: BLUE CROSS/BLUE SHIELD | Admitting: Orthopedic Surgery

## 2019-09-19 SURGERY — ARTHROPLASTY, HIP, TOTAL, ANTERIOR APPROACH
Anesthesia: Choice | Site: Hip | Laterality: Left

## 2019-09-22 ENCOUNTER — Other Ambulatory Visit: Payer: Self-pay | Admitting: Physician Assistant

## 2019-10-04 ENCOUNTER — Ambulatory Visit: Payer: BLUE CROSS/BLUE SHIELD | Admitting: Cardiology

## 2019-11-29 ENCOUNTER — Other Ambulatory Visit: Payer: Self-pay | Admitting: Interventional Cardiology

## 2019-12-03 ENCOUNTER — Other Ambulatory Visit: Payer: Self-pay | Admitting: Interventional Cardiology

## 2020-01-31 ENCOUNTER — Telehealth: Payer: Self-pay | Admitting: Interventional Cardiology

## 2020-01-31 DIAGNOSIS — I10 Essential (primary) hypertension: Secondary | ICD-10-CM

## 2020-01-31 NOTE — Telephone Encounter (Signed)
Pt states that BP has been running high recently.  This morning it was 154/97 prior to meds.  Takes HCTZ 12.5mg  QD, Lisinopril 10mg  QD and Metoprolol Succinate 50mg  QD.  Took BP again today and it was 137/97.  Denies increased salt in diet.  Denies CP, SOB, lightheadedness, dizziness, blurred vision or swelling.  Had a slight HA this morning that has resolved.  Pt scheduled for Virtual Visit with on 1/25.  Will send to Dr. Georgie Chard to see if any recommendations in the meantime.

## 2020-01-31 NOTE — Telephone Encounter (Signed)
Left message to call back  

## 2020-01-31 NOTE — Telephone Encounter (Signed)
Increase lisinopril to 20 mg daily. We have no labs since 2018. Please hold potassium if taking. BMET in 7 days. Next step will be increased dose of HCTZ. Monitor BP after AM meds. Make appointment with me since I have not seen since 2018. 2-3 weeks.Marland KitchenMarland KitchenMarland Kitchen

## 2020-01-31 NOTE — Telephone Encounter (Signed)
Attempted to call pt again.  Left message to call back.    Triage, if pt calls back while I am off, you can use 2/2 at 11:40A or 1:40P or 2/8 at 3:20P for f/u and cancel VV with Noreene Larsson on 1/25.

## 2020-01-31 NOTE — Telephone Encounter (Signed)
Pt c/o BP issue: STAT if pt c/o blurred vision, one-sided weakness or slurred speech  1. What are your last 5 BP readings? 01/30/20 at 2pm it was 153/99 then today it is 123/90  2. Are you having any other symptoms (ex. Dizziness, headache, blurred vision, passed out)? No symptoms  3. What is your BP issue? BP is elevated. I scheduled him for a VV with Georgie Chard on 02/05/20, in the meantime, his wife would like to know what Dr. Katrinka Blazing recommends. Should he contact his PCP?   Spoke to wife but she asked that we call his cell phone.

## 2020-02-04 NOTE — Telephone Encounter (Signed)
Left message to call back  

## 2020-02-04 NOTE — Progress Notes (Unsigned)
{Choose 1 Note Type (Video or Telephone):[phone]}    Date:  02/04/2020   ID:  Lawrence [name], DOB Aug 11, 1950, MRN [id number] The patient was identified using 2 identifiers.  {Patient Location:[phone]::"Home"} {Provider Location:[phone]::"Home Office"}  PCP:  Andi Devon, MD  Cardiologist:  [name], MD *** Electrophysiologist:  None   Evaluation Performed:  {Choose Visit Type:[phone]::"Follow-Up Visit"}  Chief Complaint:  Follow up for elevated BP  History of Present Illness:    Lawrence [name] is a 70 y.o. male with a hx of CAD s/p NSTEMI with apical infarct 01/2012 s/p unsuccessful POBA (failed PTCA) of the distal LAD, subsequent apical wall motion abnormality which was treated with anticoagulation for 6 weeks post MI.  Also history of OSA on CPAP, HTN and HLD.   Plavix was stopped in the setting of a normal treadmill stress test. Last echocardiogram 01/2012 with LVEF at 50 to 55% with severe hypokinesis of the apical myocardium and severe LVH.  Plan at last follow-up 10/11/2018 was for [facility] stress test given duration of time since previous assessment.  This was performed 10/18/2018 which was found to be normal with no ST segment deviation during stress, LVEF normal at 55 to 65% with no evidence of infarct or ischemia.  He was last seen by myself for pre-operative clearance at which time he had been doing very well from a CV standpoint and he was ultimately cleared for surgery.   Lawrence [name] then called the office 01/31/20 stating that his BPs had recently been running higher then normal at 154/97 before antihypertensives. He was scheduled for an appointment and Dr. Katrinka Blazing recommended increasing lisinopril to 20mg  QD and hold K+ with plans for BMET in 7 days. Plan will be to increase HCTZ  Today Lawrence [name] states that     1.  CAD: -Failed PTCA to the LAD -Last seen 10/11/2018 with plans for Lexiscan Myoview performed 10/18/2018 which was found to be  normal with no evidence of ischemia or infarct -Denies chest pain today   2.  HLD: -Lipid management per PCP with goal of <70 -Continue current regimen   3.  Hypertension: -Stable, 120/80 -Continue current regimen   4.  Preoperative cardiac evaluation: -The patient does not have any unstable cardiac conditions. Upon evaluation today, hecan achieve 4 METs or greater without anginal symptoms. According to [facility] and AHA guidelines, herequires no further cardiac workup prior to hisnoncardiac surgery and should be at acceptable risk. His revised cardiac risk of peri-procedural MI or cardiac arrest following surgeryis low.  -He may hold ASA 3-5 days prior to procedure then resume when safe from a surgical standpoint.    The patient {does/does not:200015} have symptoms concerning for COVID-19 infection (fever, chills, cough, or new shortness of breath).    Past Medical History:  Diagnosis Date  . Anxiety   . Arthritis    hips  . CAD (coronary artery disease)    a. NSTEMI 1/14 - LHC: Ostial/proximal LAD 50%, distal LAD occluded near the apex with faint left to left collaterals, EF 50% with apical akinesis.;  b. Echo 1/14: Severe LVH, EF 50-55%, apical HK, mild LAE;  c. ETT-Myoview 3/14: EF 69%, apical scar, no ischemia;   d. ETT 12/15: no ST ischemic ST changes   . Coronary artery disease involving native heart 11/29/2013   Occluded apical LAD. 50% proximal to mid LAD. Apical infarct January 2014.   February 2014 GERD (gastroesophageal reflux disease)    OTC  . History of  MI (myocardial infarction)   . HTN (hypertension) 01/23/2012  . Hyperlipidemia 01/24/2012   LDL > 160 on admission   . Hyperlipidemia LDL goal < 70   . Hypertension   . Left ventricular aneurysm   . NSTEMI. old. 01/23/2012  . Obstructive sleep apnea 01/26/2012   Greater than 20 year history   . Sleep apnea    on CPAP   Past Surgical History:  Procedure Laterality Date  . CARDIAC CATHETERIZATION    . CORONARY STENT  PLACEMENT    . HERNIA REPAIR Left    inguinal   . LEFT HEART CATHETERIZATION WITH CORONARY ANGIOGRAM N/A 01/23/2012   Procedure: LEFT HEART CATHETERIZATION WITH CORONARY ANGIOGRAM;  Surgeon: [name], MD;  Location: [facility] CATH LAB;  Service: Cardiovascular;  Laterality: N/A;  . TOTAL HIP ARTHROPLASTY Right 2018     No outpatient medications have been marked as taking for the 02/05/20 encounter (Appointment) with Filbert Schilder, NP.     Allergies:   Patient has no known allergies.   Social History   Tobacco Use  . Smoking status: Former Smoker    Types: Cigars    Quit date: 01/12/2012    Years since quitting: 8.0  . Smokeless tobacco: Never Used  Vaping Use  . Vaping Use: Never used  Substance Use Topics  . Alcohol use: No  . Drug use: No     Family Hx: The patient's family history includes Hypertension in his mother. There is no history of Fainting, Heart attack, Heart disease, Heart failure, Hyperlipidemia, Anemia, Asthma, or Clotting disorder.  ROS:   Please see the history of present illness.    *** All other systems reviewed and are negative.   Prior CV studies:   The following studies were reviewed today:   Lexiscan Myoview stress test 10/18/2018:   Nuclear stress EF: 60%.  There was no ST segment deviation noted during stress.  The study is normal.  This is a low risk study.  The left ventricular ejection fraction is normal (55-65%).  Normal pharmacologic nuclear stress test with no evidence for prior infarct or ischemia.   Labs/Other Tests and Data Reviewed:    EKG:  {EKG/Telemetry Strips Reviewed:[phone]}  Recent Labs: No results found for requested labs within last 8760 hours.   Recent Lipid Panel Lab Results  Component Value Date/Time   CHOL 189 06/25/2016 11:13 AM   TRIG 191 (H) 06/25/2016 11:13 AM   HDL 49 06/25/2016 11:13 AM   CHOLHDL 3.9 06/25/2016 11:13 AM   CHOLHDL 4 12/20/2013 08:40 AM   LDLCALC 102 (H) 06/25/2016 11:13  AM    Wt Readings from Last 3 Encounters:  07/09/19 232 lb (105.2 kg)  10/18/18 234 lb (106.1 kg)  10/11/18 234 lb 12.8 oz (106.5 kg)     Risk Assessment/Calculations:   {Does this patient have ATRIAL FIBRILLATION?:[phone]}  Objective:    Vital Signs:  There were no vitals taken for this visit.   {HeartCare Virtual Exam (Optional):[phone]::"VITAL SIGNS:  reviewed"}  ASSESSMENT & PLAN:    1. ***   {Are you ordering a CV Procedure (e.g. stress test, cath, DCCV, TEE, etc)?   Press F2        :[id number]}    COVID-19 Education: The signs and symptoms of COVID-19 were discussed with the patient and how to seek care for testing (follow up with PCP or arrange E-visit).  ***The importance of social distancing was discussed today.  Time:   Today, I have spent ***  minutes with the patient with telehealth technology discussing the above problems.     Medication Adjustments/Labs and Tests Ordered: Current medicines are reviewed at length with the patient today.  Concerns regarding medicines are outlined above.   Tests Ordered: No orders of the defined types were placed in this encounter.   Medication Changes: No orders of the defined types were placed in this encounter.   Follow Up:  {F/U Format:[phone]} {follow up:15908}  Signed, Georgie Chard, NP  02/04/2020 4:51 AM    [facility]

## 2020-02-05 ENCOUNTER — Telehealth: Payer: Medicare Other | Admitting: Cardiology

## 2020-02-05 ENCOUNTER — Telehealth: Payer: Self-pay | Admitting: *Deleted

## 2020-02-05 ENCOUNTER — Other Ambulatory Visit: Payer: Self-pay

## 2020-02-05 NOTE — Telephone Encounter (Signed)
  Patient Consent for Virtual Visit         [name] has provided verbal consent on 02/05/2020 for a virtual visit (video or telephone).   CONSENT FOR VIRTUAL VISIT FOR:  Lawrence [name]  By participating in this virtual visit I agree to the following:  I hereby voluntarily request, consent and authorize [facility] and its employed or contracted physicians, physician assistants, nurse practitioners or other licensed health care professionals (the Practitioner), to provide me with telemedicine health care services (the "Services") as deemed necessary by the treating Practitioner. I acknowledge and consent to receive the Services by the Practitioner via telemedicine. I understand that the telemedicine visit Lawrence involve communicating with the Practitioner through live audiovisual communication technology and the disclosure of certain medical information by electronic transmission. I acknowledge that I have been given the opportunity to request an in-person assessment or other available alternative prior to the telemedicine visit and am voluntarily participating in the telemedicine visit.  I understand that I have the right to withhold or withdraw my consent to the use of telemedicine in the course of my care at any time, without affecting my right to future care or treatment, and that the Practitioner or I may terminate the telemedicine visit at any time. I understand that I have the right to inspect all information obtained and/or recorded in the course of the telemedicine visit and may receive copies of available information for a reasonable fee.  I understand that some of the potential risks of receiving the Services via telemedicine include:  Marland Kitchen Delay or interruption in medical evaluation due to technological equipment failure or disruption; . Information transmitted may not be sufficient (e.g. poor resolution of images) to allow for appropriate medical decision making by the Practitioner;  and/or  . In rare instances, security protocols could fail, causing a breach of personal health information.  Furthermore, I acknowledge that it is my responsibility to provide information about my medical history, conditions and care that is complete and accurate to the best of my ability. I acknowledge that Practitioner's advice, recommendations, and/or decision may be based on factors not within their control, such as incomplete or inaccurate data provided by me or distortions of diagnostic images or specimens that may result from electronic transmissions. I understand that the practice of medicine is not an exact science and that Practitioner makes no warranties or guarantees regarding treatment outcomes. I acknowledge that a copy of this consent can be made available to me via my patient portal [facility] MyChart), or I can request a printed copy by calling the office of [facility].    I understand that my insurance Lawrence be billed for this visit.   I have read or had this consent read to me. . I understand the contents of this consent, which adequately explains the benefits and risks of the Services being provided via telemedicine.  . I have been provided ample opportunity to ask questions regarding this consent and the Services and have had my questions answered to my satisfaction. . I give my informed consent for the services to be provided through the use of telemedicine in my medical care

## 2020-02-06 MED ORDER — LISINOPRIL 20 MG PO TABS
20.0000 mg | ORAL_TABLET | Freq: Every day | ORAL | 3 refills | Status: DC
Start: 2020-02-06 — End: 2021-02-24

## 2020-02-06 NOTE — Addendum Note (Signed)
Addended by: Julio Sicks on: 02/06/2020 02:38 PM   Modules accepted: Orders

## 2020-02-06 NOTE — Telephone Encounter (Addendum)
Spoke with pt and reviewed recommendations per Dr. Katrinka Blazing.  Will come for labs on 2/3 and to see Dr. Katrinka Blazing on 2/22.  Advised pt to monitor BP and let us know if it is getting too low.  Pt verbalized understanding and was in agreement with this plan.

## 2020-02-14 ENCOUNTER — Other Ambulatory Visit: Payer: BLUE CROSS/BLUE SHIELD

## 2020-02-27 ENCOUNTER — Ambulatory Visit: Payer: BLUE CROSS/BLUE SHIELD | Admitting: Acute Care

## 2020-03-03 ENCOUNTER — Ambulatory Visit: Payer: BLUE CROSS/BLUE SHIELD | Admitting: Acute Care

## 2020-03-03 NOTE — Progress Notes (Deleted)
Cardiology Office Note:    Date:  03/03/2020   ID:  Lawrence [name], DOB 1950-05-15, MRN [id number]  PCP:  Andi Devon, MD  Cardiologist:  [name], MD   Referring MD: Andi Devon, MD   No chief complaint on file.   History of Present Illness:    Lawrence [name] is a 70 y.o. male with a hx of CAD, s/p NSTEMI (apical infarct) in 01/2012 s/p unsuccessful POBAof the distal LAD, subsequentapical wall motion abnormality and was treated with anticoagulation for 6 weeks post MI, OSA on CPAP, HTN, and HLD who presents to clinic for pre op clearance prior to hip arthroplasty.  Recent blood pressure elevation.  ***  Past Medical History:  Diagnosis Date  . Anxiety   . Arthritis    hips  . CAD (coronary artery disease)    a. NSTEMI 1/14 - LHC: Ostial/proximal LAD 50%, distal LAD occluded near the apex with faint left to left collaterals, EF 50% with apical akinesis.;  b. Echo 1/14: Severe LVH, EF 50-55%, apical HK, mild LAE;  c. ETT-Myoview 3/14: EF 69%, apical scar, no ischemia;   d. ETT 12/15: no ST ischemic ST changes   . Coronary artery disease involving native heart 11/29/2013   Occluded apical LAD. 50% proximal to mid LAD. Apical infarct January 2014.   Marland Kitchen GERD (gastroesophageal reflux disease)    OTC  . History of MI (myocardial infarction)   . HTN (hypertension) 01/23/2012  . Hyperlipidemia 01/24/2012   LDL > 160 on admission   . Hyperlipidemia LDL goal < 70   . Hypertension   . Left ventricular aneurysm   . NSTEMI. old. 01/23/2012  . Obstructive sleep apnea 01/26/2012   Greater than 20 year history   . Sleep apnea    on CPAP    Past Surgical History:  Procedure Laterality Date  . CARDIAC CATHETERIZATION    . CORONARY STENT PLACEMENT    . HERNIA REPAIR Left    inguinal   . LEFT HEART CATHETERIZATION WITH CORONARY ANGIOGRAM N/A 01/23/2012   Procedure: LEFT HEART CATHETERIZATION WITH CORONARY ANGIOGRAM;  Surgeon: [name], MD;  [facility] CATH  [facility];  Service: Cardiovascular;  Laterality: N/A;  . TOTAL HIP ARTHROPLASTY Right 2018    Current Medications: No outpatient medications have been marked as taking for the 03/04/20 encounter (Appointment) with Lyn Records, MD.     Allergies:   Patient has no known allergies.   Social History   Socioeconomic History  . Marital status: Married    Spouse name: Not on file  . Number of children: Not on file  . Years of education: Not on file  . Highest education level: Not on file  Occupational History  . Occupation: Estate agent  Tobacco Use  . Smoking status: Former Smoker    Types: Cigars    Quit date: 01/12/2012    Years since quitting: 8.1  . Smokeless tobacco: Never Used  Vaping Use  . Vaping Use: Never used  Substance and Sexual Activity  . Alcohol use: No  . Drug use: No  . Sexual activity: Not on file  Other Topics Concern  . Not on file  Social History Narrative  . Not on file   Social Determinants of Health   Financial Resource Strain: Not on file  Food Insecurity: Not on file  Transportation Needs: Not on file  Physical Activity: Not on file  Stress: Not on file  Social Connections:  Not on file     Family History: The patient's family history includes Hypertension in his mother. There is no history of Fainting, Heart attack, Heart disease, Heart failure, Hyperlipidemia, Anemia, Asthma, or Clotting disorder.  ROS:   Please see the history of present illness.    *** All other systems reviewed and are negative.  EKGs/Labs/Other Studies Reviewed:    The following studies were reviewed today: ***  EKG:  EKG ***  Recent Labs: No results found for requested labs within last 8760 hours.  Recent Lipid Panel    Component Value Date/Time   CHOL 189 06/25/2016 1113   TRIG 191 (H) 06/25/2016 1113   HDL 49 06/25/2016 1113   CHOLHDL 3.9 06/25/2016 1113   CHOLHDL 4 12/20/2013 0840   VLDL 22.6 12/20/2013 0840   LDLCALC 102 (H) 06/25/2016 1113     Physical Exam:    VS:  There were no vitals taken for this visit.    Wt Readings from Last 3 Encounters:  07/09/19 232 lb (105.2 kg)  10/18/18 234 lb (106.1 kg)  10/11/18 234 lb 12.8 oz (106.5 kg)     GEN: ***. No acute distress HEENT: Normal NECK: No JVD. LYMPHATICS: No lymphadenopathy CARDIAC: *** murmur. RRR *** gallop, or edema. VASCULAR: *** Normal Pulses. No bruits. RESPIRATORY:  Clear to auscultation without rales, wheezing or rhonchi  ABDOMEN: Soft, non-tender, non-distended, No pulsatile mass, MUSCULOSKELETAL: No deformity  SKIN: Warm and dry NEUROLOGIC:  Alert and oriented x 3 PSYCHIATRIC:  Normal affect   ASSESSMENT:    1. Coronary artery disease involving native coronary artery of native heart without angina pectoris   2. Essential hypertension   3. Obstructive sleep apnea   4. Mixed hyperlipidemia   5. Preop cardiovascular exam   6. Educated about COVID-19 virus infection    PLAN:    In order of problems listed above:  1. ***   Medication Adjustments/Labs and Tests Ordered: Current medicines are reviewed at length with the patient today.  Concerns regarding medicines are outlined above.  No orders of the defined types were placed in this encounter.  No orders of the defined types were placed in this encounter.   There are no Patient Instructions on file for this visit.   Signed, [name], MD  03/03/2020 6:04 PM    [facility]

## 2020-03-04 ENCOUNTER — Ambulatory Visit: Payer: BLUE CROSS/BLUE SHIELD | Admitting: Interventional Cardiology

## 2020-03-04 DIAGNOSIS — I10 Essential (primary) hypertension: Secondary | ICD-10-CM

## 2020-03-04 DIAGNOSIS — G4733 Obstructive sleep apnea (adult) (pediatric): Secondary | ICD-10-CM

## 2020-03-04 DIAGNOSIS — E782 Mixed hyperlipidemia: Secondary | ICD-10-CM

## 2020-03-04 DIAGNOSIS — Z7189 Other specified counseling: Secondary | ICD-10-CM

## 2020-03-04 DIAGNOSIS — Z01810 Encounter for preprocedural cardiovascular examination: Secondary | ICD-10-CM

## 2020-03-04 DIAGNOSIS — I2510 Atherosclerotic heart disease of native coronary artery without angina pectoris: Secondary | ICD-10-CM

## 2020-03-10 ENCOUNTER — Ambulatory Visit: Payer: BLUE CROSS/BLUE SHIELD | Admitting: Adult Health

## 2020-04-13 ENCOUNTER — Other Ambulatory Visit: Payer: Self-pay | Admitting: Interventional Cardiology

## 2020-04-30 ENCOUNTER — Other Ambulatory Visit: Payer: Self-pay | Admitting: Interventional Cardiology

## 2020-06-02 ENCOUNTER — Encounter: Payer: Self-pay | Admitting: Pulmonary Disease

## 2020-06-02 ENCOUNTER — Ambulatory Visit (INDEPENDENT_AMBULATORY_CARE_PROVIDER_SITE_OTHER): Payer: Medicare Other | Admitting: Pulmonary Disease

## 2020-06-02 ENCOUNTER — Other Ambulatory Visit: Payer: Self-pay

## 2020-06-02 VITALS — BP 110/80 | HR 85 | Temp 97.4°F | Ht 70.0 in | Wt 218.4 lb

## 2020-06-02 DIAGNOSIS — Z9989 Dependence on other enabling machines and devices: Secondary | ICD-10-CM | POA: Diagnosis not present

## 2020-06-02 DIAGNOSIS — G4733 Obstructive sleep apnea (adult) (pediatric): Secondary | ICD-10-CM | POA: Diagnosis not present

## 2020-06-02 NOTE — Patient Instructions (Signed)
Will have [employer] arrange for new ResMed CPAP machine  Follow up in 5 months

## 2020-06-02 NOTE — Progress Notes (Signed)
[facility]  Chief Complaint  Patient presents with  . Follow-up    Doing good-    Constitutional:  BP 110/80 (BP Location: Right Arm, Cuff Size: Normal)   Pulse 85   Temp (!) 97.4 F (36.3 C) (Temporal)   Ht 5\' 10"  (1.778 m)   Wt 218 lb 6.4 oz (99.1 kg)   SpO2 97%   BMI 31.34 kg/m   Past Medical History:  HTN, CAD, HLD, Anxiety, GERD, Arthritis  Past Surgical History:  He  has a past surgical history that includes Coronary stent placement; left heart catheterization with coronary angiogram (N/A, 01/23/2012); Cardiac catheterization; Hernia repair (Left); and Total hip arthroplasty (Right, 2018).  Brief Summary:  Lawrence [name] is a 70 y.o. male former cigar smoker with obstructive sleep apnea.       Subjective:   He uses CPAP nightly.  Has full face mask.  Goes to bed at 10 pm.  Sometimes wakes up to use bathroom, but then goes back to sleep w/o issue.  Gets up at 630 am.  Feels rested.  Occasional dry mouth.  Not having sinus congestion, sore throat, or aerophagia.  His machine is starting to make funny noises.  Physical Exam:   Appearance - well kempt   ENMT - no sinus tenderness, no oral exudate, no LAN, Mallampati 3 airway, no stridor  Respiratory - equal breath sounds bilaterally, no wheezing or rales  CV - s1s2 regular rate and rhythm, no murmurs  Ext - no clubbing, no edema  Skin - no rashes  Psych - normal mood and affect   Sleep Tests:   HST 08/15/15 >> AHI 16.8, SaO2 low 83%  CPAP 04/30/20 to 05/29/20 >> used on 29 of 30 nights with average 7 hrs 43 min.  Average AHI 0.5 with CPAP 8 cm H2O  Cardiac Tests:   Echo 01/26/12 >> EF 50 to 55%  Social History:  He  reports that he quit smoking about 8 years ago. His smoking use included cigars. He has never used smokeless tobacco. He reports that he does not drink alcohol and does not use drugs.  Family History:  His family history includes Hypertension in  his mother.     Assessment/Plan:   Obstructive sleep apnea. - he is compliant with CPAP and reports benefit from therapy - uses [employer] for his DME - his current machine is more than 68 yrs old, not functioning properly and not amenable to repair - will arrange for new ResMed CPAP machine at 8 cm H2O - explained he might need repeat home sleep study prior to insurance approval for new CPAP machine  Time Spent Involved in Patient Care on Day of Examination:  15 minutes  Follow up:  Patient Instructions  Will have [employer] arrange for new ResMed CPAP machine  Follow up in 5 months   Medication List:   Allergies as of 06/02/2020   No Known Allergies     Medication List       Accurate as of Jun 02, 2020  9:42 AM. If you have any questions, ask your nurse or doctor.        ALPRAZolam 0.25 MG tablet Commonly known as: XANAX Take 1 tablet (0.25 mg total) by mouth at bedtime as needed for anxiety.   aspirin EC 81 MG tablet Take 81 mg by mouth daily.   atorvastatin 80 MG tablet Commonly known as: LIPITOR TAKE 1/2 TAB BY MOUTH EVERY DAY AT  6PM   co-enzyme Q-10 50 MG capsule Take 50 mg by mouth every evening.   hydrochlorothiazide 12.5 MG capsule Commonly known as: MICROZIDE Take 1 capsule (12.5 mg total) by mouth daily. Please make yearly appt with Dr. Katrinka Blazing for June 2022 for future refills. Thank you 1st attempt   lisinopril 20 MG tablet Commonly known as: ZESTRIL Take 1 tablet (20 mg total) by mouth daily.   metoprolol succinate 50 MG 24 hr tablet Commonly known as: TOPROL-XL TAKE 1 TABLET BY MOUTH EVERY DAY   multivitamin with minerals Tabs tablet Take 1 tablet by mouth daily.   nitroGLYCERIN 0.4 MG SL tablet Commonly known as: NITROSTAT Place 1 tablet (0.4 mg total) under the tongue every 5 (five) minutes as needed for chest pain.   traMADol 50 MG tablet Commonly known as: ULTRAM Take 50 mg by mouth as needed.   vitamin C 1000 MG tablet Take 1,000 mg by  mouth daily.   Vitamin D3 25 MCG (1000 UT) Caps Take 4,000 Units by mouth daily.       Signature:  Coralyn Helling, MD [facility]monary/Critical Care Pager - [phone] 06/02/2020, 9:42 AM

## 2020-07-23 ENCOUNTER — Telehealth: Payer: Self-pay

## 2020-07-23 NOTE — Telephone Encounter (Signed)
Progress notes on file from: [facility], [place], [place] [phone]. A copy of referral was sent to scheduling for appt.

## 2020-09-04 ENCOUNTER — Telehealth: Payer: Self-pay

## 2020-09-04 NOTE — Telephone Encounter (Signed)
   [facility] Pre-operative Risk Assessment    Patient Name: Lawrence [name]  DOB: 1950/07/28 MRN: [id number]  HEARTCARE STAFF:  - IMPORTANT!!!!!! Under Visit Info/Reason for Call, type in Other and utilize the format Clearance MM/DD/YY or Clearance TBD. Do not use dashes or single digits. - Please review there is not already an duplicate clearance open for this procedure. - If request is for dental extraction, please clarify the # of teeth to be extracted. - If the patient is currently at the dentist's office, call Pre-Op Callback Staff (MA/nurse) to input urgent request.  - If the patient is not currently in the dentist office, please route to the Pre-Op pool.  Request for surgical clearance:  What type of surgery is being performed? Left total hip arthroplasty   When is this surgery scheduled? 11/12/20  What type of clearance is required (medical clearance vs. Pharmacy clearance to hold med vs. Both)? Both   Are there any medications that need to be held prior to surgery and how long? Aspirin   Practice name and name of physician performing surgery? [facility], Dr. Maureen Ralphs  What is the office phone number? [phone]   7.   What is the office fax number? [phone] Attn: Glendale Chard   8.   Anesthesia type (None, local, MAC, general) ? Choice    Mendel Ryder 09/04/2020, 7:55 AM  _________________________________________________________________   (provider comments below)

## 2020-09-04 NOTE — Telephone Encounter (Signed)
Left detailed message for pt to call back and schedule an appt for pre op clearance. Pt has appt in 01/2021 with Dr. Katrinka Blazing however the pt will require an appt sooner for pre op clearance. Pt can be seen by Dr. Katrinka Blazing if available or any APP. Surgery is set for 11/12/20.

## 2020-09-04 NOTE — Telephone Encounter (Signed)
   Name: Lawrence [name]  DOB: 01-05-1951  MRN: [id number]  Primary Cardiologist: [name], MD  Chart reviewed as part of pre-operative protocol coverage. Because of Lawrence [name]'s past medical history and time since last visit, he will require a follow-up visit in order to better assess preoperative cardiovascular risk.  Pre-op covering staff: - Please schedule appointment and call patient to inform them. If patient already had an upcoming appointment within acceptable timeframe, please add "pre-op clearance" to the appointment notes so provider is aware. - Please contact requesting surgeon's office via preferred method (i.e, phone, fax) to inform them of need for appointment prior to surgery.  If applicable, this message will also be routed to pharmacy pool and/or primary cardiologist for input on holding anticoagulant/antiplatelet agent as requested below so that this information is available to the clearing provider at time of patient's appointment.   Georgie Chard, NP  09/04/2020, 9:27 AM

## 2020-09-05 NOTE — Telephone Encounter (Signed)
Pt agreeable to appt for pre op clearance with Tereso Newcomer, [facility] 10/15/20 same day Dr. Katrinka Blazing is in the office. I will forward notes to [facility] [place] for appt. Will send FYI to requesting office pt has appt 10/15/20.

## 2020-09-14 ENCOUNTER — Other Ambulatory Visit: Payer: Self-pay | Admitting: Interventional Cardiology

## 2020-10-08 ENCOUNTER — Ambulatory Visit: Payer: Medicare Other | Admitting: Physician Assistant

## 2020-10-14 NOTE — Progress Notes (Signed)
Cardiology Office Note:    Date:  10/15/2020   ID:  Lawrence [name], DOB 1950/01/25, MRN [id number]  PCP:  Andi Devon, MD   [facility] Providers Cardiologist:  [name], MD     Referring MD: Andi Devon, MD   Chief Complaint:  Surgical Clearance    Patient Profile:   Lawrence [name] is a 69 y.o. male with:  Coronary artery disease  NSTEMI in 1/14 (apical infarct) s/p unsuccessful POBA of dLAD Rx with anticoagulation x 6 mos post MI due to apical WMA Myoview 10/2018: Low risk Hypertension  Hyperlipidemia  OSA  Prior CV studies: GATED SPECT MYO PERF W/LEXISCAN STRESS 1D 10/18/2018 Narrative  Nuclear stress EF: 60%.  There was no ST segment deviation noted during stress.  The study is normal.  This is a low risk study.  The left ventricular ejection fraction is normal (55-65%). Normal pharmacologic nuclear stress test with no evidence for prior infarct or ischemia.   ETT 12/15 No evidence of ischemia by ST analysis   LHC (01/2012):  Ostial/proximal LAD 50%, distal LAD occluded near the apex with faint left to left collaterals, EF 50% with apical akinesis.   PCI: Balloon angioplasty of the distal LAD unsuccessful.   Echocardiogram (01/26/12):  Severe LVH, EF 50-55%, severe hypokinesis of the apical myocardium, mild LAE.   ETT-Myoview (03/2012):  Apical scar, no ischemia, EF 69%.  History of Present Illness: Lawrence [name] was last seen by Georgie Chard, NP in 6/21.   He returns for surgical clearance.  He needs a L THR on 11/12/20 with Dr. Lequita Halt (type of anesthesia is not known).  He is here alone.  He is overall doing well without chest pain, shortness of breath, syncope, orthopnea, leg edema.  His hip limits his activity but he is still able to climb a flight of stairs, carry in groceries, do some yard work.        Past Medical History:  Diagnosis Date   Anxiety    Arthritis    hips   CAD (coronary artery disease)    a. NSTEMI 1/14 - LHC:  Ostial/proximal LAD 50%, distal LAD occluded near the apex with faint left to left collaterals, EF 50% with apical akinesis.;  b. Echo 1/14: Severe LVH, EF 50-55%, apical HK, mild LAE;  c. ETT-Myoview 3/14: EF 69%, apical scar, no ischemia;   d. ETT 12/15: no ST ischemic ST changes    Coronary artery disease involving native heart 11/29/2013   Occluded apical LAD. 50% proximal to mid LAD. Apical infarct January 2014.    GERD (gastroesophageal reflux disease)    OTC   History of MI (myocardial infarction)    HTN (hypertension) 01/23/2012   Hyperlipidemia 01/24/2012   LDL > 160 on admission    Hyperlipidemia LDL goal < 70    Hypertension    Left ventricular aneurysm    NSTEMI. old. 01/23/2012   Obstructive sleep apnea 01/26/2012   Greater than 20 year history    Sleep apnea    on CPAP   Current Medications: Current Meds  Medication Sig   ALPRAZolam (XANAX) 0.25 MG tablet Take 1 tablet (0.25 mg total) by mouth at bedtime as needed for anxiety.   Ascorbic Acid (VITAMIN C) 1000 MG tablet Take 1,000 mg by mouth daily.   aspirin EC 81 MG tablet Take 81 mg by mouth daily.    atorvastatin (LIPITOR) 80 MG tablet TAKE 1/2 TAB BY MOUTH EVERY DAY AT [facility]  Cholecalciferol (VITAMIN D3) 1000 UNITS CAPS Take 4,000 Units by mouth daily.   co-enzyme Q-10 50 MG capsule Take 50 mg by mouth every evening.   hydrochlorothiazide (MICROZIDE) 12.5 MG capsule Take 1 capsule (12.5 mg total) by mouth daily.   lisinopril (ZESTRIL) 20 MG tablet Take 1 tablet (20 mg total) by mouth daily.   metoprolol succinate (TOPROL-XL) 50 MG 24 hr tablet TAKE 1 TABLET BY MOUTH EVERY DAY   Multiple Vitamin (MULTIVITAMIN WITH MINERALS) TABS tablet Take 1 tablet by mouth daily.   nitroGLYCERIN (NITROSTAT) 0.4 MG SL tablet Place 1 tablet (0.4 mg total) under the tongue every 5 (five) minutes as needed for chest pain.   traMADol (ULTRAM) 50 MG tablet Take 50 mg by mouth as needed.    Allergies:   Patient has no known allergies.    Social History   Tobacco Use   Smoking status: Former    Types: Cigars    Quit date: 01/12/2012    Years since quitting: 8.7   Smokeless tobacco: Never  Vaping Use   Vaping Use: Never used  Substance Use Topics   Alcohol use: No   Drug use: No    Family Hx: The patient's family history includes Hypertension in his mother. There is no history of Fainting, Heart attack, Heart disease, Heart failure, Hyperlipidemia, Anemia, Asthma, or Clotting disorder.  Review of Systems  Constitutional: Negative for fever.  Gastrointestinal:  Negative for hematochezia.  Genitourinary:  Negative for hematuria.    EKGs/Labs/Other Test Reviewed:    EKG:  EKG is   ordered today.  The ekg ordered today demonstrates NSR, HR 76, Inf Qs, Ant-Lat Qs, QTc 432 ms, no changes  Recent Labs: No results found for requested labs within last 8760 hours.   Recent Lipid Panel Lab Results  Component Value Date/Time   CHOL 189 06/25/2016 11:13 AM   TRIG 191 (H) 06/25/2016 11:13 AM   HDL 49 06/25/2016 11:13 AM   LDLCALC 102 (H) 06/25/2016 11:13 AM     Risk Assessment/Calculations:          Physical Exam:    VS:  BP 140/82   Pulse 76   Ht 5\' 10"  (1.778 m)   Wt 219 lb 3.2 oz (99.4 kg)   SpO2 97%   BMI 31.45 kg/m     Wt Readings from Last 3 Encounters:  10/15/20 219 lb 3.2 oz (99.4 kg)  06/02/20 218 lb 6.4 oz (99.1 kg)  07/09/19 232 lb (105.2 kg)    Constitutional:      Appearance: Healthy appearance. Not in distress.  Neck:     Vascular: No carotid bruit. JVD normal.  Pulmonary:     Effort: Pulmonary effort is normal.     Breath sounds: No wheezing. No rales.  Cardiovascular:     Normal rate. Regular rhythm. Normal S1. Normal S2.      Murmurs: There is no murmur.  Edema:    Peripheral edema absent.  Abdominal:     Palpations: Abdomen is soft. There is no hepatomegaly.  Skin:    General: Skin is warm and dry.  Neurological:     General: No focal deficit present.     Mental Status:  Alert and oriented to person, place and time.     Cranial Nerves: Cranial nerves are intact.         ASSESSMENT & PLAN:   1. Preoperative cardiovascular examination   Lawrence [name]'s perioperative risk of a major cardiac event is 0.9% according  to the Revised Cardiac Risk Index (RCRI).  Therefore, he is at low risk for perioperative complications.   His functional capacity is fair at 4.31 METs according to the [facility] Activity Status Index (DASI). Recommendations: According to ACC/AHA guidelines, no further cardiovascular testing needed.  The patient may proceed to surgery at acceptable risk.   Antiplatelet and/or Anticoagulation Recommendations: Ideally Aspirin should be continued without interruption.  However, if the bleeding risk is too great, Aspirin can be held for 7 days prior to his surgery.  Please resume Aspirin post operatively when it is felt to be safe from a bleeding standpoint.   2. Coronary artery disease involving native coronary artery of native heart without angina pectoris History of non-STEMI in 2014 with distal LAD occlusion.  Balloon angioplasty was not successful.  His last Myoview was in 2020 and this was low risk.  He is doing well without anginal symptoms.  Continue aspirin 81 daily, atorvastatin 40 mg daily, metoprolol succinate 50 mg daily.  Dr. Katrinka Blazing also saw the patient today.  Follow-up in 1 year.  3. Essential hypertension Blood pressure is somewhat elevated today.  He has not taken his medications yet.  His blood pressures at home are usually optimal.  Continue hydrochlorothiazide 12.5 mg daily, lisinopril 10 mg daily, metoprolol succinate 50 mg daily.  4. Mixed hyperlipidemia Continue atorvastatin 40 mg daily.  Request most recent labs from primary care.   Dispo:  Return in about 1 year (around 10/15/2021) for Routine follow up in 1 year with Dr. Katrinka Blazing. .   Medication Adjustments/Labs and Tests Ordered: Current medicines are reviewed at length with the patient  today.  Concerns regarding medicines are outlined above.  Tests Ordered: Orders Placed This Encounter  Procedures   EKG 12-Lead   Medication Changes: No orders of the defined types were placed in this encounter.  Signed, Tereso Newcomer, PA-C  10/15/2020 9:59 AM    [facility] [address] [place], [place], [place]  [address] Phone: [phone]; Fax: [phone]

## 2020-10-15 ENCOUNTER — Telehealth: Payer: Self-pay | Admitting: *Deleted

## 2020-10-15 ENCOUNTER — Ambulatory Visit (INDEPENDENT_AMBULATORY_CARE_PROVIDER_SITE_OTHER): Payer: Medicare Other | Admitting: Physician Assistant

## 2020-10-15 ENCOUNTER — Encounter: Payer: Self-pay | Admitting: Physician Assistant

## 2020-10-15 ENCOUNTER — Other Ambulatory Visit: Payer: Self-pay

## 2020-10-15 VITALS — BP 140/82 | HR 76 | Ht 70.0 in | Wt 219.2 lb

## 2020-10-15 DIAGNOSIS — I10 Essential (primary) hypertension: Secondary | ICD-10-CM | POA: Diagnosis not present

## 2020-10-15 DIAGNOSIS — Z01810 Encounter for preprocedural cardiovascular examination: Secondary | ICD-10-CM | POA: Diagnosis not present

## 2020-10-15 DIAGNOSIS — E782 Mixed hyperlipidemia: Secondary | ICD-10-CM

## 2020-10-15 DIAGNOSIS — I2510 Atherosclerotic heart disease of native coronary artery without angina pectoris: Secondary | ICD-10-CM

## 2020-10-15 NOTE — Telephone Encounter (Signed)
S/w  Dr. [place] Robinsons office, PCP @ [phone] to receive recent labs to be fax to office.

## 2020-10-15 NOTE — Patient Instructions (Signed)
Medication Instructions:   Your physician recommends that you continue on your current medications as directed. Please refer to the Current Medication list given to you today.  *If you need a refill on your cardiac medications before your next appointment, please call your pharmacy*  Lab Work:  -NONE  If you have labs (blood work) drawn today and your tests are completely normal, you will receive your results only by: MyChart Message (if you have MyChart) OR A paper copy in the mail If you have any lab test that is abnormal or we need to change your treatment, we will call you to review the results.  Testing/Procedures:  -NONE  Follow-Up: At [facility], you and your health needs are our priority.  As part of our continuing mission to provide you with exceptional heart care, we have created designated Provider Care Teams.  These Care Teams include your primary Cardiologist (physician) and Advanced Practice Providers (APPs -  Physician Assistants and Nurse Practitioners) who all work together to provide you with the care you need, when you need it.  We recommend signing up for the patient portal called "MyChart".  Sign up information is provided on this After Visit Summary.  MyChart is used to connect with patients for Virtual Visits (Telemedicine).  Patients are able to view lab/test results, encounter notes, upcoming appointments, etc.  Non-urgent messages can be sent to your provider as well.   To learn more about what you can do with MyChart, go to [email or web address].    Your next appointment:   1 year(s)  The format for your next appointment:   In Person  Provider:   [name], MD   Other Instructions Your physician wants you to follow-up in: 1 year with Dr. Smith.  You will receive a reminder letter in the mail two months in advance. If you don't receive a letter, please call our office to schedule the follow-up appointment.   

## 2020-10-15 NOTE — Telephone Encounter (Signed)
Notes faxed to surgeon Tereso Newcomer, PA-C 10/15/2020 5:05 PM

## 2020-10-27 NOTE — Patient Instructions (Addendum)
DUE TO COVID-19 ONLY ONE VISITOR IS ALLOWED TO COME WITH YOU AND STAY IN THE WAITING ROOM ONLY DURING PRE OP AND PROCEDURE DAY OF SURGERY.   Up to two visitors ages 16+ are allowed at one time in a patient's room.  The visitors may rotate out with other people throughout the day.  Additionally, up to two children between the ages of 44 and 34 are allowed and do not count toward the number of allowed visitors.  Children within this age range must be accompanied by an adult visitor.  One adult visitor may remain with the patient overnight and must be in the room by 8 PM.  YOU NEED TO HAVE A COVID 19 TEST ON__10-31-22_____ between 8am-3pm______, THIS TEST MUST BE DONE BEFORE SURGERY,     Please bring completed form with you to the COVID testing site     THIS IS A DRIVE UP TEST   COVID TESTING SITE [address] [place]   [phone]      ONCE YOUR COVID TEST IS COMPLETED,  PLEASE Wear a mask when in public           Your procedure is scheduled on: 11-12-20   Report to [facility] Main  Entrance   Report to admitting at       1140 AM     Call this number if you have problems the morning of surgery [phone]   Remember: NO SOLID FOOD AFTER MIDNIGHT THE NIGHT PRIOR TO SURGERY. NOTHING BY MOUTH EXCEPT CLEAR LIQUIDS UNTIL  1100 am   .   PLEASE FINISH ENSURE DRINK PER SURGEON ORDER  WHICH NEEDS TO BE COMPLETED AT       1100 Am the day of surgery .     CLEAR LIQUID DIET                                                                    water Black Coffee and tea, regular and decaf No Creamer                            Plain Jell-O any favor except red or purple                                  Fruit ices (not with fruit pulp)                                      Iced Popsicles                                     Carbonated beverages, regular and diet                                    Cranberry, grape and apple juices Sports drinks like Gatorade Lightly seasoned  clear broth or consume(fat free) Sugar, honey syrup _____________________________________________________________________     Paul Dykes  TEETH MORNING OF SURGERY AND RINSE YOUR MOUTH OUT, NO CHEWING GUM CANDY OR MINTS.     Take these medicines the morning of surgery with A SIP OF WATER: metoprolol                      BRING CPAP MASK AND TUBING ONLY                                 You may not have any metal on your body including hair pins and              piercings  Do not wear jewelry,  lotions, powders,perfumes/colognes or deodorant                  Men may shave face and neck.   Do not bring valuables to the hospital. [facility] IS NOT             RESPONSIBLE   FOR VALUABLES.  Contacts, dentures or bridgework may not be worn into surgery.  You may bring a small overnight bag with you     Patients discharged the day of surgery will not be allowed to drive home. IF YOU ARE HAVING SURGERY AND GOING HOME THE SAME DAY, YOU MUST HAVE AN ADULT TO DRIVE YOU HOME AND BE WITH YOU FOR 24 HOURS. YOU MAY GO HOME BY TAXI OR UBER OR ORTHERWISE, BUT AN ADULT MUST ACCOMPANY YOU HOME AND STAY WITH YOU FOR 24 HOURS.  Name and phone number of your driver:  Special Instructions: N/A              Please read over the following fact sheets you were given: _____________________________________________________________________             [facility] - Preparing for Surgery Before surgery, you can play an important role.  Because skin is not sterile, your skin needs to be as free of germs as possible.  You can reduce the number of germs on your skin by washing with CHG (chlorahexidine gluconate) soap before surgery.  CHG is an antiseptic cleaner which kills germs and bonds with the skin to continue killing germs even after washing. Please DO NOT use if you have an allergy to CHG or antibacterial soaps.  If your skin becomes reddened/irritated stop using the CHG and inform your nurse when you arrive at  Short Stay. Do not shave (including legs and underarms) for at least 48 hours prior to the first CHG shower.  You may shave your face/neck. Please follow these instructions carefully:  1.  Shower with CHG Soap the night before surgery and the  morning of Surgery.  2.  If you choose to wash your hair, wash your hair first as usual with your  normal  shampoo.  3.  After you shampoo, rinse your hair and body thoroughly to remove the  shampoo.                           4.  Use CHG as you would any other liquid soap.  You can apply chg directly  to the skin and wash                       Gently with a scrungie or clean washcloth.  5.  Apply the CHG Soap to your body ONLY FROM  THE NECK DOWN.   Do not use on face/ open                           Wound or open sores. Avoid contact with eyes, ears mouth and genitals (private parts).                       Wash face,  Genitals (private parts) with your normal soap.             6.  Wash thoroughly, paying special attention to the area where your surgery  will be performed.  7.  Thoroughly rinse your body with warm water from the neck down.  8.  DO NOT shower/wash with your normal soap after using and rinsing off  the CHG Soap.                9.  Pat yourself dry with a clean towel.            10.  Wear clean pajamas.            11.  Place clean sheets on your bed the night of your first shower and do not  sleep with pets. Day of Surgery : Do not apply any lotions/deodorants the morning of surgery.  Please wear clean clothes to the hospital/surgery center.  FAILURE TO FOLLOW THESE INSTRUCTIONS MAY RESULT IN THE CANCELLATION OF YOUR SURGERY PATIENT SIGNATURE_________________________________  NURSE SIGNATURE__________________________________  ________________________________________________________________________    Rogelia Mire  An incentive spirometer is a tool that can help keep your lungs clear and active. This tool measures how well you are  filling your lungs with each breath. Taking long deep breaths may help reverse or decrease the chance of developing breathing (pulmonary) problems (especially infection) following: A long period of time when you are unable to move or be active. BEFORE THE PROCEDURE  If the spirometer includes an indicator to show your best effort, your nurse or respiratory therapist will set it to a desired goal. If possible, sit up straight or lean slightly forward. Try not to slouch. Hold the incentive spirometer in an upright position. INSTRUCTIONS FOR USE  Sit on the edge of your bed if possible, or sit up as far as you can in bed or on a chair. Hold the incentive spirometer in an upright position. Breathe out normally. Place the mouthpiece in your mouth and seal your lips tightly around it. Breathe in slowly and as deeply as possible, raising the piston or the ball toward the top of the column. Hold your breath for 3-5 seconds or for as long as possible. Allow the piston or ball to fall to the bottom of the column. Remove the mouthpiece from your mouth and breathe out normally. Rest for a few seconds and repeat Steps 1 through 7 at least 10 times every 1-2 hours when you are awake. Take your time and take a few normal breaths between deep breaths. The spirometer may include an indicator to show your best effort. Use the indicator as a goal to work toward during each repetition. After each set of 10 deep breaths, practice coughing to be sure your lungs are clear. If you have an incision (the cut made at the time of surgery), support your incision when coughing by placing a pillow or rolled up towels firmly against it. Once you are able to get out of bed, walk around indoors and cough well. You  may stop using the incentive spirometer when instructed by your caregiver.  RISKS AND COMPLICATIONS Take your time so you do not get dizzy or light-headed. If you are in pain, you may need to take or ask for pain  medication before doing incentive spirometry. It is harder to take a deep breath if you are having pain. AFTER USE Rest and breathe slowly and easily. It can be helpful to keep track of a log of your progress. Your caregiver can provide you with a simple table to help with this. If you are using the spirometer at home, follow these instructions: SEEK MEDICAL CARE IF:  You are having difficultly using the spirometer. You have trouble using the spirometer as often as instructed. Your pain medication is not giving enough relief while using the spirometer. You develop fever of 100.5 F (38.1 C) or higher. SEEK IMMEDIATE MEDICAL CARE IF:  You cough up bloody sputum that had not been present before. You develop fever of 102 F (38.9 C) or greater. You develop worsening pain at or near the incision site. MAKE SURE YOU:  Understand these instructions. Will watch your condition. Will get help right away if you are not doing well or get worse. Document Released: 05/10/2006 Document Revised: 03/22/2011 Document Reviewed: 07/11/2006 [facility] Patient Information 2014 [place], [place].   ________________________________________________________________________

## 2020-10-27 NOTE — Progress Notes (Addendum)
PCP - Dr.kimberly Renae Gloss  Cardiologist - Clearance 10-15-20 Tereso Newcomer, PA-C epic  Dr. Verdis Prime  PPM/ICD -  Device Orders -  Rep Notified -   Chest x-ray -  EKG - 10-15-20 epic Stress Test - 10-18-18 epic ECHO - 2014  Cardiac Cath -   Sleep Study -  CPAP -   Fasting Blood Sugar -  Checks Blood Sugar _____ times a day  Blood Thinner Instructions: Aspirin Instructions: 81 mg ASA  stop 3 days  ERAS Protcol - PRE-SURGERY Ensure or G2-   COVID TEST- 11-10-20 COVID vaccine -Pf[employer] Fully vaccinated  Activity--Able to walk a flight of stairs without SOB Anesthesia review: CAD stent , MI 2014, OSA  Patient denies shortness of breath, fever, cough and chest pain at PAT appointment   All instructions explained to the patient, with a verbal understanding of the material. Patient agrees to go over the instructions while at home for a better understanding. Patient also instructed to self quarantine after being tested for COVID-19. The opportunity to ask questions was provided.

## 2020-10-31 ENCOUNTER — Encounter (HOSPITAL_COMMUNITY)
Admission: RE | Admit: 2020-10-31 | Discharge: 2020-10-31 | Disposition: A | Discharge status: Home / Self Care | Payer: Medicare Other | Source: Ambulatory Visit | Attending: Orthopedic Surgery | Admitting: Orthopedic Surgery

## 2020-10-31 ENCOUNTER — Other Ambulatory Visit: Payer: Self-pay

## 2020-10-31 ENCOUNTER — Encounter (HOSPITAL_COMMUNITY): Payer: Self-pay

## 2020-10-31 VITALS — BP 141/96 | HR 79 | Temp 98.2°F | Resp 18 | Ht 71.0 in | Wt 217.0 lb

## 2020-10-31 DIAGNOSIS — Z7982 Long term (current) use of aspirin: Secondary | ICD-10-CM | POA: Insufficient documentation

## 2020-10-31 DIAGNOSIS — M1612 Unilateral primary osteoarthritis, left hip: Secondary | ICD-10-CM | POA: Insufficient documentation

## 2020-10-31 DIAGNOSIS — I10 Essential (primary) hypertension: Secondary | ICD-10-CM | POA: Diagnosis not present

## 2020-10-31 DIAGNOSIS — Z79899 Other long term (current) drug therapy: Secondary | ICD-10-CM | POA: Diagnosis not present

## 2020-10-31 DIAGNOSIS — Z79891 Long term (current) use of opiate analgesic: Secondary | ICD-10-CM | POA: Diagnosis not present

## 2020-10-31 DIAGNOSIS — K219 Gastro-esophageal reflux disease without esophagitis: Secondary | ICD-10-CM | POA: Insufficient documentation

## 2020-10-31 DIAGNOSIS — Z01812 Encounter for preprocedural laboratory examination: Secondary | ICD-10-CM | POA: Insufficient documentation

## 2020-10-31 DIAGNOSIS — G4733 Obstructive sleep apnea (adult) (pediatric): Secondary | ICD-10-CM | POA: Insufficient documentation

## 2020-10-31 DIAGNOSIS — Z87891 Personal history of nicotine dependence: Secondary | ICD-10-CM | POA: Diagnosis not present

## 2020-10-31 DIAGNOSIS — I2510 Atherosclerotic heart disease of native coronary artery without angina pectoris: Secondary | ICD-10-CM | POA: Insufficient documentation

## 2020-10-31 DIAGNOSIS — Z955 Presence of coronary angioplasty implant and graft: Secondary | ICD-10-CM | POA: Diagnosis not present

## 2020-10-31 DIAGNOSIS — Z01818 Encounter for other preprocedural examination: Secondary | ICD-10-CM

## 2020-10-31 DIAGNOSIS — I252 Old myocardial infarction: Secondary | ICD-10-CM

## 2020-10-31 HISTORY — DX: Pneumonia, unspecified organism: J18.9

## 2020-10-31 LAB — COMPREHENSIVE METABOLIC PANEL
ALT: 27 U/L (ref 0–44)
AST: 29 U/L (ref 15–41)
Albumin: 4 g/dL (ref 3.5–5.0)
Alkaline Phosphatase: 59 U/L (ref 38–126)
Anion gap: 8 (ref 5–15)
BUN: 18 mg/dL (ref 8–23)
CO2: 27 mmol/L (ref 22–32)
Calcium: 9.1 mg/dL (ref 8.9–10.3)
Chloride: 105 mmol/L (ref 98–111)
Creatinine, Ser: 0.91 mg/dL (ref 0.61–1.24)
GFR, Estimated: >60 mL/min (ref >60)
Glucose, Bld: 91 mg/dL (ref 70–99)
Potassium: 4.2 mmol/L (ref 3.5–5.1)
Sodium: 140 mmol/L (ref 135–145)
Total Bilirubin: 0.9 mg/dL (ref 0.3–1.2)
Total Protein: 7.3 g/dL (ref 6.5–8.1)

## 2020-10-31 LAB — TYPE AND SCREEN
ABO/RH(D): O POS
Antibody Screen: NEGATIVE

## 2020-10-31 LAB — CBC
HCT: 45.7 % (ref 39.0–52.0)
Hemoglobin: 15.4 g/dL (ref 13.0–17.0)
MCH: 33.1 pg (ref 26.0–34.0)
MCHC: 33.7 g/dL (ref 30.0–36.0)
MCV: 98.3 fL (ref 80.0–100.0)
Platelets: 213 10*3/uL (ref 150–400)
RBC: 4.65 MIL/uL (ref 4.22–5.81)
RDW: 13 % (ref 11.5–15.5)
WBC: 5.4 10*3/uL (ref 4.0–10.5)
nRBC: 0 % (ref 0.0–0.2)

## 2020-10-31 LAB — PROTIME-INR
INR: 1 (ref 0.8–1.2)
Prothrombin Time: 13.3 seconds (ref 11.4–15.2)

## 2020-10-31 LAB — SURGICAL PCR SCREEN
MRSA, PCR: NEGATIVE
Staphylococcus aureus: NEGATIVE

## 2020-10-31 NOTE — Progress Notes (Signed)
No med tech at preop.Per Tom someone to arrive by 10am pt. Already left building when I finally reached [place] on his cell #.

## 2020-11-04 NOTE — Progress Notes (Signed)
Anesthesia Chart Review   Case: [id number] Date/Time: 11/12/20 1410   Procedure: TOTAL HIP ARTHROPLASTY ANTERIOR APPROACH (Left: Hip)   Anesthesia type: Choice   Pre-op diagnosis: left hip osteoarthritis   Location: [facility] ROOM 09 / [facility]   Surgeons: Ollen Gross, MD       DISCUSSION:70 y.o. former smoker with h/o HTN, GERD, sleep apnea, CAD, OSA, left hip OA scheduled for above procedure 11/12/20 with Dr. Ollen Gross.   Pt seen by cardiology 10/15/2020. Per OV note, "Mr. Klonowski's perioperative risk of a major cardiac event is 0.9% according to the Revised Cardiac Risk Index (RCRI).  Therefore, he is at low risk for perioperative complications.   His functional capacity is fair at 4.31 METs according to the Duke Activity Status Index (DASI). Recommendations: According to ACC/AHA guidelines, no further cardiovascular testing needed.  The patient may proceed to surgery at acceptable risk.   Antiplatelet and/or Anticoagulation Recommendations: Ideally Aspirin should be continued without interruption.  However, if the bleeding risk is too great, Aspirin can be held for 7 days prior to his surgery.  Please resume Aspirin post operatively when it is felt to be safe from a bleeding standpoint."  Anticipate pt can proceed with planned procedure barring acute status change.   VS: BP (!) 141/96   Pulse 79   Temp 36.8 C (Oral)   Resp 18   Ht 5\' 11"  (1.803 m)   Wt 98.4 kg   SpO2 97%   BMI 30.27 kg/m   PROVIDERS: , MD is PCP   Andi Devon, MD is Cardiologist  LABS: Labs reviewed: Acceptable for surgery. (all labs ordered are listed, but only abnormal results are displayed)  Labs Reviewed  SURGICAL PCR SCREEN  CBC  COMPREHENSIVE METABOLIC PANEL  PROTIME-INR  TYPE AND SCREEN     IMAGES:   EKG: 10/15/2020 Rate 76 bpm  NSR LAD No change   CV: Stress Test 10/18/2018 Nuclear stress EF: 60%. There was no ST segment deviation noted during stress. The study is  normal. This is a low risk study. The left ventricular ejection fraction is normal (55-65%).   Normal pharmacologic nuclear stress test with no evidence for prior infarct or ischemia.    Echo 01/26/2012 Study Conclusions   - Left ventricle: Wall thickness was increased in a pattern    of severe LVH. Systolic function was normal. The estimated    ejection fraction was in the range of 50% to 55%. There is    severe hypokinesis of the apical myocardium.  - Left atrium: The atrium was mildly dilated.  Past Medical History:  Diagnosis Date   Anxiety    Arthritis    hips   CAD (coronary artery disease)    a. NSTEMI 1/14 - LHC: Ostial/proximal LAD 50%, distal LAD occluded near the apex with faint left to left collaterals, EF 50% with apical akinesis.;  b. Echo 1/14: Severe LVH, EF 50-55%, apical HK, mild LAE;  c. ETT-Myoview 3/14: EF 69%, apical scar, no ischemia;   d. ETT 12/15: no ST ischemic ST changes    Coronary artery disease involving native heart 11/29/2013   Occluded apical LAD. 50% proximal to mid LAD. Apical infarct January 2014.    GERD (gastroesophageal reflux disease)    OTC   History of MI (myocardial infarction)    HTN (hypertension) 01/23/2012   Hyperlipidemia 01/24/2012   LDL > 160 on admission    Hyperlipidemia LDL goal < 70    Hypertension  Left ventricular aneurysm    NSTEMI. old. 01/23/2012   Obstructive sleep apnea 01/26/2012   Greater than 20 year history    Pneumonia    many years ago   Sleep apnea    on CPAP    Past Surgical History:  Procedure Laterality Date   CARDIAC CATHETERIZATION     CORONARY STENT PLACEMENT     HERNIA REPAIR Left    inguinal    LEFT HEART CATHETERIZATION WITH CORONARY ANGIOGRAM N/A 01/23/2012   Procedure: LEFT HEART CATHETERIZATION WITH CORONARY ANGIOGRAM;  Surgeon: [name], MD;  Location: [facility] CATH LAB;  Service: Cardiovascular;  Laterality: N/A;   TOTAL HIP ARTHROPLASTY Right 2018    MEDICATIONS:  ALPRAZolam  (XANAX) 0.25 MG tablet   Ascorbic Acid (VITAMIN C) 1000 MG tablet   aspirin EC 81 MG tablet   atorvastatin (LIPITOR) 80 MG tablet   Cholecalciferol (VITAMIN D3) 1000 UNITS CAPS   co-enzyme Q-10 50 MG capsule   hydrochlorothiazide (MICROZIDE) 12.5 MG capsule   lisinopril (ZESTRIL) 20 MG tablet   metoprolol succinate (TOPROL-XL) 50 MG 24 hr tablet   Multiple Vitamin (MULTIVITAMIN WITH MINERALS) TABS tablet   nitroGLYCERIN (NITROSTAT) 0.4 MG SL tablet   traMADol (ULTRAM) 50 MG tablet   No current facility-administered medications for this encounter.   Jodell Cipro Ward, PA-C [facility] [phone]

## 2020-11-04 NOTE — Anesthesia Preprocedure Evaluation (Addendum)
Anesthesia Evaluation  Patient identified by MRN, date of birth, ID band Patient awake    Reviewed: Allergy & Precautions, NPO status , Patient's Chart, lab work & pertinent test results  Airway Mallampati: II  TM Distance: >3 FB Neck ROM: Full    Dental no notable dental hx.    Pulmonary sleep apnea , former smoker,    Pulmonary exam normal breath sounds clear to auscultation       Cardiovascular hypertension, Pt. on medications and Pt. on home beta blockers + CAD, + Past MI and + Cardiac Stents  Normal cardiovascular exam Rhythm:Regular Rate:Normal  Stress Test 10/18/2018 ? Nuclear stress EF: 60%. ? There was no ST segment deviation noted during stress. ? The study is normal. ? This is a low risk study. ? The left ventricular ejection fraction is normal (55-65%).    Neuro/Psych negative neurological ROS  negative psych ROS   GI/Hepatic Neg liver ROS, GERD  Medicated,  Endo/Other  negative endocrine ROS  Renal/GU negative Renal ROS  negative genitourinary   Musculoskeletal  (+) Arthritis , Osteoarthritis,    Abdominal   Peds negative pediatric ROS (+)  Hematology negative hematology ROS (+)   Anesthesia Other Findings   Reproductive/Obstetrics negative OB ROS                            Anesthesia Physical Anesthesia Plan  ASA: 3  Anesthesia Plan: Spinal   Post-op Pain Management:    Induction: Intravenous  PONV Risk Score and Plan: 1 and Propofol infusion and Treatment may vary due to age or medical condition  Airway Management Planned: Simple Face Mask  Additional Equipment:   Intra-op Plan:   Post-operative Plan:   Informed Consent: I have reviewed the patients History and Physical, chart, labs and discussed the procedure including the risks, benefits and alternatives for the proposed anesthesia with the patient or authorized representative who has indicated his/her  understanding and acceptance.     Dental advisory given  Plan Discussed with: CRNA and Surgeon  Anesthesia Plan Comments: (See PAT note 10/31/20, Jodell Cipro Ward, PA-C)       Anesthesia Quick Evaluation

## 2020-11-05 ENCOUNTER — Telehealth: Payer: Self-pay | Admitting: Physician Assistant

## 2020-11-05 NOTE — Telephone Encounter (Signed)
Lvm for pt to call office to go over recommendations.

## 2020-11-05 NOTE — H&P (Signed)
TOTAL HIP ADMISSION H&P  Patient is admitted for left total hip arthroplasty.  Subjective:  Chief Complaint: Left hip pain  HPI: Lawrence [name], 70 y.o. male, has a history of pain and functional disability in the left hip due to arthritis and patient has failed non-surgical conservative treatments for greater than 12 weeks to include NSAID's and/or analgesics, flexibility and strengthening excercises, and activity modification. Onset of symptoms was gradual, starting  several  years ago with gradually worsening course since that time. The patient noted no past surgery on the left hip. Patient currently rates pain in the left hip at 7 out of 10 with activity. Patient has worsening of pain with activity and weight bearing, pain that interfers with activities of daily living, pain with passive range of motion, and crepitus. Patient has evidence of periarticular osteophytes and joint space narrowing by imaging studies. This condition presents safety issues increasing the risk of falls. There is no current active infection.  Patient Active Problem List   Diagnosis Date Noted   Coronary artery disease involving native heart 11/29/2013   Obstructive sleep apnea 01/26/2012   Hyperlipidemia 01/24/2012   NSTEMI. old. 01/23/2012   HTN (hypertension) 01/23/2012    Past Medical History:  Diagnosis Date   Anxiety    Arthritis    hips   CAD (coronary artery disease)    a. NSTEMI 1/14 - LHC: Ostial/proximal LAD 50%, distal LAD occluded near the apex with faint left to left collaterals, EF 50% with apical akinesis.;  b. Echo 1/14: Severe LVH, EF 50-55%, apical HK, mild LAE;  c. ETT-Myoview 3/14: EF 69%, apical scar, no ischemia;   d. ETT 12/15: no ST ischemic ST changes    Coronary artery disease involving native heart 11/29/2013   Occluded apical LAD. 50% proximal to mid LAD. Apical infarct January 2014.    GERD (gastroesophageal reflux disease)    OTC   History of MI (myocardial infarction)    HTN  (hypertension) 01/23/2012   Hyperlipidemia 01/24/2012   LDL > 160 on admission    Hyperlipidemia LDL goal < 70    Hypertension    Left ventricular aneurysm    NSTEMI. old. 01/23/2012   Obstructive sleep apnea 01/26/2012   Greater than 20 year history    Pneumonia    many years ago   Sleep apnea    on CPAP    Past Surgical History:  Procedure Laterality Date   CARDIAC CATHETERIZATION     CORONARY STENT PLACEMENT     HERNIA REPAIR Left    inguinal    LEFT HEART CATHETERIZATION WITH CORONARY ANGIOGRAM N/A 01/23/2012   Procedure: LEFT HEART CATHETERIZATION WITH CORONARY ANGIOGRAM;  Surgeon: [name], MD;  Location: [facility] CATH LAB;  Service: Cardiovascular;  Laterality: N/A;   TOTAL HIP ARTHROPLASTY Right 2018    Prior to Admission medications   Medication Sig Start Date End Date Taking? Authorizing Provider  ALPRAZolam (XANAX) 0.25 MG tablet Take 1 tablet (0.25 mg total) by mouth at bedtime as needed for anxiety. 03/05/15   Tereso Newcomer T, PA-C  Ascorbic Acid (VITAMIN C) 1000 MG tablet Take 1,000 mg by mouth daily.    [provider]  aspirin EC 81 MG tablet Take 81 mg by mouth daily.     [provider]  atorvastatin (LIPITOR) 80 MG tablet TAKE 1/2 TAB BY MOUTH EVERY DAY AT 6PM 09/24/19   Lyn Records, MD  Cholecalciferol (VITAMIN D3) 1000 UNITS CAPS Take 4,000 Units  by mouth daily.    [provider]  co-enzyme Q-10 50 MG capsule Take 50 mg by mouth every evening.    [provider]  hydrochlorothiazide (MICROZIDE) 12.5 MG capsule Take 1 capsule (12.5 mg total) by mouth daily. 09/16/20   Tereso Newcomer T, PA-C  lisinopril (ZESTRIL) 20 MG tablet Take 1 tablet (20 mg total) by mouth daily. 02/06/20   Lyn Records, MD  metoprolol succinate (TOPROL-XL) 50 MG 24 hr tablet TAKE 1 TABLET BY MOUTH EVERY DAY 12/03/19   Lyn Records, MD  Multiple Vitamin (MULTIVITAMIN WITH MINERALS) TABS tablet Take 1 tablet by mouth daily.    [provider]  nitroGLYCERIN (NITROSTAT) 0.4 MG SL tablet Place 1 tablet (0.4 mg total) under the tongue every 5 (five) minutes as needed for chest pain. 07/17/19   Lyn Records, MD  traMADol (ULTRAM) 50 MG tablet Take 50 mg by mouth as needed. 05/25/19   [provider]    No Known Allergies  Social History   Socioeconomic History   Marital status: Married    Spouse name: Not on file   Number of children: Not on file   Years of education: Not on file   Highest education level: Not on file  Occupational History   Occupation: Estate agent  Tobacco Use   Smoking status: Former    Types: Cigars    Quit date: 01/12/2012    Years since quitting: 8.8   Smokeless tobacco: Never  Vaping Use   Vaping Use: Never used  Substance and Sexual Activity   Alcohol use: No   Drug use: No   Sexual activity: Not Currently  Other Topics Concern   Not on file  Social History Narrative   Not on file   Social Determinants of Health   Financial Resource Strain: Not on file  Food Insecurity: Not on file  Transportation Needs: Not on file  Physical Activity: Not on file  Stress: Not on file  Social Connections: Not on file  Intimate Partner Violence: Not on file    Tobacco Use: Medium Risk   Smoking Tobacco Use: Former   Smokeless Tobacco Use: Never   Passive Exposure: Not on file   Social History   Substance and Sexual Activity  Alcohol Use No    Family History  Problem Relation Age of Onset   Hypertension Mother    Fainting Neg Hx    Heart attack Neg Hx    Heart disease Neg Hx    Heart failure Neg Hx    Hyperlipidemia Neg Hx    Anemia Neg Hx    Asthma Neg Hx    Clotting disorder Neg Hx     ROS: Constitutional: no fever, no chills, no night sweats, no significant weight loss Cardiovascular: no chest pain, no palpitations Respiratory: no cough, no shortness of breath, No COPD Gastrointestinal: no vomiting, no nausea Musculoskeletal: no swelling in Joints, Joint  Pain Neurologic: no numbness, no tingling, no difficulty with balance    Objective:  Physical Exam: Well nourished and well developed.  General: Alert and oriented x3, cooperative and pleasant, no acute distress.  Head: normocephalic, atraumatic, neck supple.  Eyes: EOMI.  Respiratory: breath sounds clear in all fields, no wheezing, rales, or rhonchi. Cardiovascular: Regular rate and rhythm, no murmurs, gallops or rubs.  Abdomen: non-tender to palpation and soft, normoactive bowel sounds. Musculoskeletal:  The patient has an antalgic gait pattern on the left.    Left Hip Exam:  The range of motion: Flexion to 90 degrees, internal rotation to 0 degrees, external rotation to 20 degrees, and abduction to 20 degrees without discomfort.  There is no tenderness over the greater trochanteric bursa.    The patient's sensation and motor function are intact in their lower extremities. Their distal pulses are 2+. The bilateral calves are soft and non-tender.     Vital signs in last 24 hours:    Imaging Review   Radiographs- AP pelvis, AP and lateral of the left hip dated 08/22/2020 demonstrate severe end-stage erosive arthritis of the left hip with large subchondral cyst formation, some erosion of the femoral head, and massive osteophytes.   Assessment/Plan:  End stage arthritis, left hip  The patient history, physical examination, clinical judgement of the provider and imaging studies are consistent with end stage degenerative joint disease of the left hip and total hip arthroplasty is deemed medically necessary. The treatment options including medical management, injection therapy, arthroscopy and arthroplasty were discussed at length. The risks and benefits of total hip arthroplasty were presented and reviewed. The risks due to aseptic loosening, infection, stiffness, dislocation/subluxation, thromboembolic complications and other imponderables were discussed. The patient acknowledged  the explanation, agreed to proceed with the plan and consent was signed. Patient is being admitted for inpatient treatment for surgery, pain control, PT, OT, prophylactic antibiotics, VTE prophylaxis, progressive ambulation and ADLs and discharge planning.The patient is planning to be discharged  home .   Patient's anticipated LOS is less than 2 midnights, meeting these requirements: - Lives within 1 hour of care - Has a competent adult at home to recover with post-op - NO history of  - Chronic pain requiring opioids  - Diabetes  - Coronary Artery Disease  - Heart failure  - Heart attack  - Stroke  - DVT/VTE  - Cardiac arrhythmia  - Respiratory Failure/COPD  - Renal failure  - Anemia  - Advanced Liver disease  Therapy Plans: HEP  Disposition: Home with Wife Planned DVT Prophylaxis: Xarelto 10mg   DME Needed: None PCP: Dr. Cardiologist: Dr. Andi Devon (clearance received) TXA: IV Allergies: Levaquin (GI) Anesthesia Concerns: Sleep Apnea BMI: 31.7 Last HgbA1c: N/A  Ph[facility] on [address]  Other: History of Heart Attack    - Patient was instructed on what medications to stop prior to surgery. - Follow-up visit in 2 weeks with Dr. Verdis Prime - Begin physical therapy following surgery - Pre-operative lab work as pre-surgical testing - Prescriptions Lawrence be provided in hospital at time of discharge  Lequita Halt, [facility], PA-C Orthopedic Surgery [facility] [place]

## 2020-11-05 NOTE — Telephone Encounter (Signed)
Labs from primary care received. 11/08/2019: LDL 107, HDL 65, triglycerides 95, total cholesterol 189 Creatinine 1.0, K+ 4.1, ALT 27, Hgb 15.1  LDL above goal.  Goal is <70.  PLAN: Increase atorvastatin 80 mg daily Fasting lipids, LFTs in 3 months Tereso Newcomer, PA-C 11/05/2020 1:39 PM

## 2020-11-10 ENCOUNTER — Other Ambulatory Visit: Payer: Self-pay | Admitting: Orthopedic Surgery

## 2020-11-11 ENCOUNTER — Other Ambulatory Visit: Payer: Self-pay | Admitting: *Deleted

## 2020-11-11 DIAGNOSIS — I2510 Atherosclerotic heart disease of native coronary artery without angina pectoris: Secondary | ICD-10-CM

## 2020-11-11 DIAGNOSIS — E782 Mixed hyperlipidemia: Secondary | ICD-10-CM

## 2020-11-11 LAB — SARS CORONAVIRUS 2 (TAT 6-24 HRS): SARS Coronavirus 2: NEGATIVE

## 2020-11-11 MED ORDER — ATORVASTATIN CALCIUM 80 MG PO TABS: 80.0000 mg | ORAL_TABLET | Freq: Every day | ORAL | 11 refills | Status: DC

## 2020-11-11 NOTE — Telephone Encounter (Signed)
S/w pt is aware of lab recommendations. Pt will increase atorvastatin one tablet by mouth ( 80 mg ) daily Sent # 30 to requested pharmacy. Updated medication list. Pt will come in for lipid/lft Feb 15 appt made, orders in and linked.

## 2020-11-12 ENCOUNTER — Observation Stay (HOSPITAL_COMMUNITY)
Admission: RE | Admit: 2020-11-12 | Discharge: 2020-11-13 | Disposition: A | Discharge status: Home / Self Care | Payer: Medicare Other | Attending: Orthopedic Surgery | Admitting: Orthopedic Surgery

## 2020-11-12 ENCOUNTER — Ambulatory Visit (HOSPITAL_COMMUNITY): Payer: Medicare Other

## 2020-11-12 ENCOUNTER — Encounter (HOSPITAL_COMMUNITY): Payer: Self-pay | Admitting: Orthopedic Surgery

## 2020-11-12 ENCOUNTER — Other Ambulatory Visit: Payer: Self-pay

## 2020-11-12 ENCOUNTER — Encounter (HOSPITAL_COMMUNITY)
Admission: RE | Disposition: A | Discharge status: Home / Self Care | Payer: Self-pay | Source: Home / Self Care | Attending: Orthopedic Surgery

## 2020-11-12 ENCOUNTER — Ambulatory Visit (HOSPITAL_COMMUNITY): Payer: Medicare Other | Admitting: Physician Assistant

## 2020-11-12 ENCOUNTER — Observation Stay (HOSPITAL_COMMUNITY): Payer: Medicare Other

## 2020-11-12 ENCOUNTER — Ambulatory Visit (HOSPITAL_COMMUNITY): Payer: Medicare Other | Admitting: Anesthesiology

## 2020-11-12 DIAGNOSIS — I2510 Atherosclerotic heart disease of native coronary artery without angina pectoris: Secondary | ICD-10-CM | POA: Insufficient documentation

## 2020-11-12 DIAGNOSIS — Z87891 Personal history of nicotine dependence: Secondary | ICD-10-CM | POA: Insufficient documentation

## 2020-11-12 DIAGNOSIS — G4733 Obstructive sleep apnea (adult) (pediatric): Secondary | ICD-10-CM | POA: Insufficient documentation

## 2020-11-12 DIAGNOSIS — I10 Essential (primary) hypertension: Secondary | ICD-10-CM | POA: Diagnosis not present

## 2020-11-12 DIAGNOSIS — I252 Old myocardial infarction: Secondary | ICD-10-CM | POA: Insufficient documentation

## 2020-11-12 DIAGNOSIS — Z419 Encounter for procedure for purposes other than remedying health state, unspecified: Secondary | ICD-10-CM

## 2020-11-12 DIAGNOSIS — M1612 Unilateral primary osteoarthritis, left hip: Secondary | ICD-10-CM | POA: Diagnosis present

## 2020-11-12 DIAGNOSIS — Z96649 Presence of unspecified artificial hip joint: Secondary | ICD-10-CM

## 2020-11-12 DIAGNOSIS — Z79899 Other long term (current) drug therapy: Secondary | ICD-10-CM | POA: Insufficient documentation

## 2020-11-12 DIAGNOSIS — Z96642 Presence of left artificial hip joint: Secondary | ICD-10-CM | POA: Diagnosis present

## 2020-11-12 DIAGNOSIS — M169 Osteoarthritis of hip, unspecified: Secondary | ICD-10-CM | POA: Diagnosis present

## 2020-11-12 HISTORY — PX: TOTAL HIP ARTHROPLASTY: SHX124

## 2020-11-12 LAB — ABO/RH: ABO/RH(D): O POS

## 2020-11-12 SURGERY — ARTHROPLASTY, HIP, TOTAL, ANTERIOR APPROACH
Anesthesia: Spinal | Site: Hip | Laterality: Left

## 2020-11-12 MED ORDER — TRANEXAMIC ACID-NACL 1000-0.7 MG/100ML-% IV SOLN
1000.0000 mg | INTRAVENOUS | Status: AC
  Administered 2020-11-12: 1000 mg via INTRAVENOUS
  Filled 2020-11-12: qty 100

## 2020-11-12 MED ORDER — WATER FOR IRRIGATION, STERILE IR SOLN
Status: DC | PRN
  Administered 2020-11-12: 2000 mL

## 2020-11-12 MED ORDER — PHENOL 1.4 % MT LIQD: 1.0000 | OROMUCOSAL | Status: DC | PRN

## 2020-11-12 MED ORDER — PROPOFOL 500 MG/50ML IV EMUL
INTRAVENOUS | Status: DC | PRN
  Administered 2020-11-12: 75 ug/kg/min via INTRAVENOUS

## 2020-11-12 MED ORDER — ATORVASTATIN CALCIUM 80 MG PO TABS
80.0000 mg | ORAL_TABLET | Freq: Every day | ORAL | Status: DC
  Administered 2020-11-13 (×2): 80 mg via ORAL
  Filled 2020-11-12: qty 1
  Filled 2020-11-12: qty 2

## 2020-11-12 MED ORDER — POLYETHYLENE GLYCOL 3350 17 G PO PACK: 17.0000 g | PACK | Freq: Every day | ORAL | Status: DC | PRN

## 2020-11-12 MED ORDER — ONDANSETRON HCL 4 MG/2ML IJ SOLN
INTRAMUSCULAR | Status: AC
  Filled 2020-11-12: qty 2

## 2020-11-12 MED ORDER — MIDAZOLAM HCL 2 MG/2ML IJ SOLN
INTRAMUSCULAR | Status: AC
  Filled 2020-11-12: qty 2

## 2020-11-12 MED ORDER — DEXAMETHASONE SODIUM PHOSPHATE 10 MG/ML IJ SOLN
10.0000 mg | Freq: Once | INTRAMUSCULAR | Status: AC
  Administered 2020-11-13: 10 mg via INTRAVENOUS
  Filled 2020-11-12: qty 1

## 2020-11-12 MED ORDER — PROPOFOL 500 MG/50ML IV EMUL
INTRAVENOUS | Status: AC
  Filled 2020-11-12: qty 50

## 2020-11-12 MED ORDER — TRAMADOL HCL 50 MG PO TABS: 50.0000 mg | ORAL_TABLET | Freq: Four times a day (QID) | ORAL | Status: DC | PRN

## 2020-11-12 MED ORDER — HYDROCHLOROTHIAZIDE 12.5 MG PO CAPS
12.5000 mg | ORAL_CAPSULE | Freq: Every day | ORAL | Status: DC
  Filled 2020-11-12: qty 1

## 2020-11-12 MED ORDER — SODIUM CHLORIDE 0.9 % IV SOLN: INTRAVENOUS | Status: DC

## 2020-11-12 MED ORDER — METOPROLOL SUCCINATE ER 50 MG PO TB24
50.0000 mg | ORAL_TABLET | Freq: Every day | ORAL | Status: DC
  Administered 2020-11-13: 50 mg via ORAL
  Filled 2020-11-12: qty 1

## 2020-11-12 MED ORDER — METOCLOPRAMIDE HCL 5 MG PO TABS: 5.0000 mg | ORAL_TABLET | Freq: Three times a day (TID) | ORAL | Status: DC | PRN

## 2020-11-12 MED ORDER — ONDANSETRON HCL 4 MG/2ML IJ SOLN
INTRAMUSCULAR | Status: DC | PRN
  Administered 2020-11-12: 4 mg via INTRAVENOUS

## 2020-11-12 MED ORDER — OXYCODONE HCL 5 MG/5ML PO SOLN: 5.0000 mg | Freq: Once | ORAL | Status: DC | PRN

## 2020-11-12 MED ORDER — MIDAZOLAM HCL 5 MG/5ML IJ SOLN
INTRAMUSCULAR | Status: DC | PRN
  Administered 2020-11-12: 2 mg via INTRAVENOUS

## 2020-11-12 MED ORDER — CHLORHEXIDINE GLUCONATE 0.12 % MT SOLN
15.0000 mL | Freq: Once | OROMUCOSAL | Status: AC
  Administered 2020-11-12: 15 mL via OROMUCOSAL

## 2020-11-12 MED ORDER — CEFAZOLIN SODIUM-DEXTROSE 2-4 GM/100ML-% IV SOLN
2.0000 g | INTRAVENOUS | Status: AC
  Administered 2020-11-12: 2 g via INTRAVENOUS
  Filled 2020-11-12: qty 100

## 2020-11-12 MED ORDER — DEXAMETHASONE SODIUM PHOSPHATE 10 MG/ML IJ SOLN
INTRAMUSCULAR | Status: DC | PRN
  Administered 2020-11-12: 10 mg via INTRAVENOUS

## 2020-11-12 MED ORDER — 0.9 % SODIUM CHLORIDE (POUR BTL) OPTIME
TOPICAL | Status: DC | PRN
  Administered 2020-11-12: 1000 mL

## 2020-11-12 MED ORDER — ALPRAZOLAM 0.25 MG PO TABS: 0.2500 mg | ORAL_TABLET | Freq: Every evening | ORAL | Status: DC | PRN

## 2020-11-12 MED ORDER — BUPIVACAINE HCL 0.25 % IJ SOLN
INTRAMUSCULAR | Status: DC | PRN
  Administered 2020-11-12: 30 mL

## 2020-11-12 MED ORDER — BISACODYL 10 MG RE SUPP: 10.0000 mg | Freq: Every day | RECTAL | Status: DC | PRN

## 2020-11-12 MED ORDER — MENTHOL 3 MG MT LOZG: 1.0000 | LOZENGE | OROMUCOSAL | Status: DC | PRN

## 2020-11-12 MED ORDER — RIVAROXABAN 10 MG PO TABS
10.0000 mg | ORAL_TABLET | Freq: Every day | ORAL | Status: DC
  Administered 2020-11-13: 10 mg via ORAL
  Filled 2020-11-12: qty 1

## 2020-11-12 MED ORDER — HYDROMORPHONE HCL 1 MG/ML IJ SOLN: 0.2500 mg | INTRAMUSCULAR | Status: DC | PRN

## 2020-11-12 MED ORDER — BUPIVACAINE IN DEXTROSE 0.75-8.25 % IT SOLN
INTRATHECAL | Status: DC | PRN
  Administered 2020-11-12: 1.8 mL via INTRATHECAL

## 2020-11-12 MED ORDER — METHOCARBAMOL 500 MG PO TABS
500.0000 mg | ORAL_TABLET | Freq: Four times a day (QID) | ORAL | Status: DC | PRN
  Administered 2020-11-12 – 2020-11-13 (×2): 500 mg via ORAL
  Filled 2020-11-12 (×2): qty 1

## 2020-11-12 MED ORDER — NITROGLYCERIN 0.4 MG SL SUBL: 0.4000 mg | SUBLINGUAL_TABLET | SUBLINGUAL | Status: DC | PRN

## 2020-11-12 MED ORDER — MORPHINE SULFATE (PF) 2 MG/ML IV SOLN: 0.5000 mg | INTRAVENOUS | Status: DC | PRN

## 2020-11-12 MED ORDER — FENTANYL CITRATE (PF) 100 MCG/2ML IJ SOLN
INTRAMUSCULAR | Status: AC
  Filled 2020-11-12: qty 2

## 2020-11-12 MED ORDER — ONDANSETRON HCL 4 MG/2ML IJ SOLN: 4.0000 mg | Freq: Once | INTRAMUSCULAR | Status: DC | PRN

## 2020-11-12 MED ORDER — ONDANSETRON HCL 4 MG PO TABS: 4.0000 mg | ORAL_TABLET | Freq: Four times a day (QID) | ORAL | Status: DC | PRN

## 2020-11-12 MED ORDER — DEXAMETHASONE SODIUM PHOSPHATE 10 MG/ML IJ SOLN: 8.0000 mg | Freq: Once | INTRAMUSCULAR | Status: DC

## 2020-11-12 MED ORDER — PROPOFOL 10 MG/ML IV BOLUS
INTRAVENOUS | Status: DC | PRN
  Administered 2020-11-12 (×3): 20 mg via INTRAVENOUS

## 2020-11-12 MED ORDER — ACETAMINOPHEN 325 MG PO TABS: 325.0000 mg | ORAL_TABLET | Freq: Four times a day (QID) | ORAL | Status: DC | PRN

## 2020-11-12 MED ORDER — METOCLOPRAMIDE HCL 5 MG/ML IJ SOLN: 5.0000 mg | Freq: Three times a day (TID) | INTRAMUSCULAR | Status: DC | PRN

## 2020-11-12 MED ORDER — PROPOFOL 10 MG/ML IV BOLUS
INTRAVENOUS | Status: AC
  Filled 2020-11-12: qty 20

## 2020-11-12 MED ORDER — DOCUSATE SODIUM 100 MG PO CAPS
100.0000 mg | ORAL_CAPSULE | Freq: Two times a day (BID) | ORAL | Status: DC
  Administered 2020-11-12 – 2020-11-13 (×2): 100 mg via ORAL
  Filled 2020-11-12 (×2): qty 1

## 2020-11-12 MED ORDER — DEXAMETHASONE SODIUM PHOSPHATE 10 MG/ML IJ SOLN
INTRAMUSCULAR | Status: AC
  Filled 2020-11-12: qty 1

## 2020-11-12 MED ORDER — HYDROCODONE-ACETAMINOPHEN 5-325 MG PO TABS
1.0000 | ORAL_TABLET | ORAL | Status: DC | PRN
  Administered 2020-11-12: 2 via ORAL
  Administered 2020-11-12 (×2): 1 via ORAL
  Administered 2020-11-13 (×2): 2 via ORAL
  Filled 2020-11-12: qty 2
  Filled 2020-11-12: qty 1
  Filled 2020-11-12 (×2): qty 2
  Filled 2020-11-12: qty 1

## 2020-11-12 MED ORDER — POVIDONE-IODINE 10 % EX SWAB
2.0000 "application " | Freq: Once | CUTANEOUS | Status: AC
  Administered 2020-11-12: 2 via TOPICAL

## 2020-11-12 MED ORDER — METHOCARBAMOL 500 MG IVPB - SIMPLE MED
500.0000 mg | Freq: Four times a day (QID) | INTRAVENOUS | Status: DC | PRN
  Filled 2020-11-12: qty 50

## 2020-11-12 MED ORDER — LACTATED RINGERS IV SOLN: INTRAVENOUS | Status: DC

## 2020-11-12 MED ORDER — ONDANSETRON HCL 4 MG/2ML IJ SOLN: 4.0000 mg | Freq: Four times a day (QID) | INTRAMUSCULAR | Status: DC | PRN

## 2020-11-12 MED ORDER — ORAL CARE MOUTH RINSE: 15.0000 mL | Freq: Once | OROMUCOSAL | Status: AC

## 2020-11-12 MED ORDER — OXYCODONE HCL 5 MG PO TABS: 5.0000 mg | ORAL_TABLET | Freq: Once | ORAL | Status: DC | PRN

## 2020-11-12 MED ORDER — CEFAZOLIN SODIUM-DEXTROSE 2-4 GM/100ML-% IV SOLN
2.0000 g | Freq: Four times a day (QID) | INTRAVENOUS | Status: AC
  Administered 2020-11-12 – 2020-11-13 (×2): 2 g via INTRAVENOUS
  Filled 2020-11-12 (×2): qty 100

## 2020-11-12 MED ORDER — ACETAMINOPHEN 10 MG/ML IV SOLN
1000.0000 mg | Freq: Four times a day (QID) | INTRAVENOUS | Status: DC
  Administered 2020-11-12: 1000 mg via INTRAVENOUS
  Filled 2020-11-12: qty 100

## 2020-11-12 MED ORDER — BUPIVACAINE HCL 0.25 % IJ SOLN
INTRAMUSCULAR | Status: AC
  Filled 2020-11-12: qty 1

## 2020-11-12 MED ORDER — FENTANYL CITRATE (PF) 100 MCG/2ML IJ SOLN
INTRAMUSCULAR | Status: DC | PRN
  Administered 2020-11-12: 100 ug via INTRAVENOUS

## 2020-11-12 SURGICAL SUPPLY — 44 items
BAG COUNTER SPONGE SURGICOUNT (BAG) IMPLANT
BAG DECANTER FOR FLEXI CONT (MISCELLANEOUS) IMPLANT
BAG SPEC THK2 15X12 ZIP CLS (MISCELLANEOUS)
BAG SPNG CNTER NS LX DISP (BAG)
BAG ZIPLOCK 12X15 (MISCELLANEOUS) IMPLANT
BLADE SAG 18X100X1.27 (BLADE) ×2 IMPLANT
CLSR STERI-STRIP ANTIMIC 1/2X4 (GAUZE/BANDAGES/DRESSINGS) ×1 IMPLANT
COVER PERINEAL POST (MISCELLANEOUS) ×2 IMPLANT
COVER SURGICAL LIGHT HANDLE (MISCELLANEOUS) ×2 IMPLANT
CUP ACETBLR 54 OD PINNACLE (Hips) ×1 IMPLANT
DECANTER SPIKE VIAL GLASS SM (MISCELLANEOUS) ×2 IMPLANT
DRAPE FOOT SWITCH (DRAPES) ×2 IMPLANT
DRAPE STERI IOBAN 125X83 (DRAPES) ×2 IMPLANT
DRAPE U-SHAPE 47X51 STRL (DRAPES) ×4 IMPLANT
DRSG AQUACEL AG ADV 3.5X10 (GAUZE/BANDAGES/DRESSINGS) ×2 IMPLANT
DURAPREP 26ML APPLICATOR (WOUND CARE) ×2 IMPLANT
ELECT REM PT RETURN 15FT ADLT (MISCELLANEOUS) ×2 IMPLANT
GLOVE SRG 8 PF TXTR STRL LF DI (GLOVE) ×1 IMPLANT
GLOVE SURG ENC MOIS LTX SZ6.5 (GLOVE) ×2 IMPLANT
GLOVE SURG ENC MOIS LTX SZ7 (GLOVE) ×2 IMPLANT
GLOVE SURG ENC MOIS LTX SZ8 (GLOVE) ×4 IMPLANT
GLOVE SURG UNDER POLY LF SZ7 (GLOVE) ×2 IMPLANT
GLOVE SURG UNDER POLY LF SZ8 (GLOVE) ×2
GLOVE SURG UNDER POLY LF SZ8.5 (GLOVE) IMPLANT
GOWN STRL REUS W/TWL LRG LVL3 (GOWN DISPOSABLE) ×4 IMPLANT
GOWN STRL REUS W/TWL XL LVL3 (GOWN DISPOSABLE) IMPLANT
HEAD CERAMIC 36 PLUS5 (Hips) ×1 IMPLANT
HOLDER FOLEY CATH W/STRAP (MISCELLANEOUS) ×2 IMPLANT
KIT TURNOVER KIT A (KITS) IMPLANT
LINER NEUTRAL 54X36MM PLUS 4 (Hips) ×1 IMPLANT
MANIFOLD NEPTUNE II (INSTRUMENTS) ×2 IMPLANT
PACK ANTERIOR HIP CUSTOM (KITS) ×2 IMPLANT
PENCIL SMOKE EVACUATOR COATED (MISCELLANEOUS) ×2 IMPLANT
STEM FEM ACTIS HIGH SZ3 (Stem) ×1 IMPLANT
STRIP CLOSURE SKIN 1/2X4 (GAUZE/BANDAGES/DRESSINGS) ×2 IMPLANT
SUT ETHIBOND NAB CT1 #1 30IN (SUTURE) ×2 IMPLANT
SUT MNCRL AB 4-0 PS2 18 (SUTURE) ×2 IMPLANT
SUT STRATAFIX 0 PDS 27 VIOLET (SUTURE) ×2
SUT VIC AB 2-0 CT1 27 (SUTURE) ×4
SUT VIC AB 2-0 CT1 TAPERPNT 27 (SUTURE) ×2 IMPLANT
SUTURE STRATFX 0 PDS 27 VIOLET (SUTURE) ×1 IMPLANT
SYR 50ML LL SCALE MARK (SYRINGE) IMPLANT
TRAY FOLEY MTR SLVR 16FR STAT (SET/KITS/TRAYS/PACK) ×2 IMPLANT
TUBE SUCTION HIGH CAP CLEAR NV (SUCTIONS) ×2 IMPLANT

## 2020-11-12 NOTE — Transfer of Care (Signed)
Immediate Anesthesia Transfer of Care Note  Patient: Lawrence [name]  Procedure(s) Performed: TOTAL HIP ARTHROPLASTY ANTERIOR APPROACH (Left: Hip)  Patient Location: PACU  Anesthesia Type:Spinal  Level of Consciousness: drowsy  Airway & Oxygen Therapy: Patient Spontanous Breathing and Patient connected to face mask oxygen  Post-op Assessment: Report given to RN and Post -op Vital signs reviewed and stable  Post vital signs: Reviewed and stable  Last Vitals:  Vitals Value Taken Time  BP 136/94 11/12/20 1503  Temp 36.6 C 11/12/20 1503  Pulse 66 11/12/20 1504  Resp    SpO2 100 % 11/12/20 1504  Vitals shown include unvalidated device data.  Last Pain:  Vitals:   11/12/20 1137  TempSrc:   PainSc: 0-No pain      Patients Stated Pain Goal: 4 (11/12/20 1137)  Complications: No notable events documented.

## 2020-11-12 NOTE — Anesthesia Postprocedure Evaluation (Signed)
Anesthesia Post Note  Patient: Lawrence [name]  Procedure(s) Performed: TOTAL HIP ARTHROPLASTY ANTERIOR APPROACH (Left: Hip)     Patient location during evaluation: PACU Anesthesia Type: Spinal Level of consciousness: oriented and awake and alert Pain management: pain level controlled Vital Signs Assessment: post-procedure vital signs reviewed and stable Respiratory status: spontaneous breathing, respiratory function stable and patient connected to nasal cannula oxygen Cardiovascular status: blood pressure returned to baseline and stable Postop Assessment: no headache, no backache and no apparent nausea or vomiting Anesthetic complications: no   No notable events documented.  Last Vitals:  Vitals:   11/12/20 1530 11/12/20 1545  BP: (!) 142/91 (!) 144/87  Pulse: 65 (!) 54  Resp: 17 10  Temp:    SpO2: 95% 94%    Last Pain:  Vitals:   11/12/20 1545  TempSrc:   PainSc: 0-No pain    LLE Motor Response: Purposeful movement (11/12/20 1557) LLE Sensation: Increased (11/12/20 1557) RLE Motor Response: Purposeful movement (11/12/20 1557) RLE Sensation: Increased (11/12/20 1557)      Lashala Laser S

## 2020-11-12 NOTE — Anesthesia Procedure Notes (Addendum)
Date/Time: 11/12/2020 1:11 PM Performed by: Florene Route, CRNA Oxygen Delivery Method: Simple face mask

## 2020-11-12 NOTE — Op Note (Signed)
OPERATIVE REPORT- TOTAL HIP ARTHROPLASTY   PREOPERATIVE DIAGNOSIS: Osteoarthritis of the Left hip.   POSTOPERATIVE DIAGNOSIS: Osteoarthritis of the Left  hip.   PROCEDURE: Left total hip arthroplasty, anterior approach.   SURGEON: Ollen Gross, MD   ASSISTANT: Arther Abbott, PA-C  ANESTHESIA:  Spinal  ESTIMATED BLOOD LOSS:-700 mL    DRAINS: Hemovac x1.   COMPLICATIONS: None   CONDITION: PACU - hemodynamically stable.   BRIEF CLINICAL NOTE: Lawrence [name] is a 70 y.o. male who has advanced end-  stage arthritis of their Left  hip with progressively worsening pain and  dysfunction.The patient has failed nonoperative management and presents for  total hip arthroplasty.   PROCEDURE IN DETAIL: After successful administration of spinal  anesthetic, the traction boots for the [facility] bed were placed on both  feet and the patient was placed onto the [facility] bed, boots placed into the leg  holders. The Left hip was then isolated from the perineum with plastic  drapes and prepped and draped in the usual sterile fashion. ASIS and  greater trochanter were marked and a oblique incision was made, starting  at about 1 cm lateral and 2 cm distal to the ASIS and coursing towards  the anterior cortex of the femur. The skin was cut with a 10 blade  through subcutaneous tissue to the level of the fascia overlying the  tensor fascia lata muscle. The fascia was then incised in line with the  incision at the junction of the anterior third and posterior 2/3rd. The  muscle was teased off the fascia and then the interval between the TFL  and the rectus was developed. The Hohmann retractor was then placed at  the top of the femoral neck over the capsule. The vessels overlying the  capsule were cauterized and the fat on top of the capsule was removed.  A Hohmann retractor was then placed anterior underneath the rectus  femoris to give exposure to the entire anterior capsule. A T-shaped   capsulotomy was performed. The edges were tagged and the femoral head  was identified.       Osteophytes are removed off the superior acetabulum.  The femoral neck was then cut in situ with an oscillating saw. Traction  was then applied to the left lower extremity utilizing the [facility]  traction. The femoral head was then removed. Retractors were placed  around the acetabulum and then circumferential removal of the labrum was  performed. Osteophytes were also removed. Reaming starts at 49 mm to  medialize and  Increased in 2 mm increments to 53 mm. We reamed in  approximately 40 degrees of abduction, 20 degrees anteversion. A 54 mm  pinnacle acetabular shell was then impacted in anatomic position under  fluoroscopic guidance with excellent purchase. We did not need to place  any additional dome screws. A 36 mm neutral + 4 marathon liner was then  placed into the acetabular shell.       The femoral lift was then placed along the lateral aspect of the femur  just distal to the vastus ridge. The leg was  externally rotated and capsule  was stripped off the inferior aspect of the femoral neck down to the  level of the lesser trochanter, this was done with electrocautery. The femur was lifted after this was performed. The  leg was then placed in an extended and adducted position essentially delivering the femur. We also removed the capsule superiorly and the piriformis from the piriformis  fossa to gain excellent exposure of the  proximal femur. Rongeur was used to remove some cancellous bone to get  into the lateral portion of the proximal femur for placement of the  initial starter reamer. The starter broaches was placed  the starter broach  and was shown to go down the center of the canal. Broaching  with the Actis system was then performed starting at size 0  coursing  Up to size 3. A size 3 had excellent torsional and rotational  and axial stability. The trial high offset neck was then placed   with a 36 + 5 trial head. The hip was then reduced. We confirmed that  the stem was in the canal both on AP and lateral x-rays. It also has excellent sizing. The hip was reduced with outstanding stability through full extension and full external rotation.. AP pelvis was taken and the leg lengths were measured and found to be equal. Hip was then dislocated again and the femoral head and neck removed. The  femoral broach was removed. Size 3 Actis stem with a high offset  neck was then impacted into the femur following native anteversion. Has  excellent purchase in the canal. Excellent torsional and rotational and  axial stability. It is confirmed to be in the canal on AP and lateral  fluoroscopic views. The 36  + 5 ceramic head was placed and the hip  reduced with outstanding stability. Again AP pelvis was taken and it  confirmed that the leg lengths were equal. The wound was then copiously  irrigated with saline solution and the capsule reattached and repaired  with Ethibond suture. 30 ml of .25% Bupivicaine was  injected into the capsule and into the edge of the tensor fascia lata as well as subcutaneous tissue. The fascia overlying the tensor fascia lata was then closed with a running #1 V-Loc. Subcu was closed with interrupted 2-0 Vicryl and subcuticular running 4-0 Monocryl. Incision was cleaned  and dried. Steri-Strips and a bulky sterile dressing applied. The patient was awakened and transported to  recovery in stable condition.        Please note that a surgical assistant was a medical necessity for this procedure to perform it in a safe and expeditious manner. Assistant was necessary to provide appropriate retraction of vital neurovascular structures and to prevent femoral fracture and allow for anatomic placement of the prosthesis.  Ollen Gross, M.D.

## 2020-11-12 NOTE — Interval H&P Note (Signed)
History and Physical Interval Note:  11/12/2020 11:52 AM  Lawrence [name]  has presented today for surgery, with the diagnosis of left hip osteoarthritis.  The various methods of treatment have been discussed with the patient and family. After consideration of risks, benefits and other options for treatment, the patient has consented to  Procedure(s): TOTAL HIP ARTHROPLASTY ANTERIOR APPROACH (Left) as a surgical intervention.  The patient's history has been reviewed, patient examined, no change in status, stable for surgery.  I have reviewed the patient's chart and labs.  Questions were answered to the patient's satisfaction.     [name]

## 2020-11-12 NOTE — Anesthesia Procedure Notes (Signed)
Spinal  Patient location during procedure: OR Start time: 11/12/2020 1:12 PM End time: 11/12/2020 1:16 PM Reason for block: surgical anesthesia Staffing Performed: resident/CRNA  Resident/CRNA: Reardon, Diana L, CRNA Preanesthetic Checklist Completed: patient identified, IV checked, site marked, risks and benefits discussed, surgical consent, monitors and equipment checked, pre-op evaluation and timeout performed Spinal Block Patient position: sitting Prep: DuraPrep and site prepped and draped Patient monitoring: heart rate, continuous pulse ox and blood pressure Approach: midline Location: L3-4 Injection technique: single-shot Needle Needle type: Pencan  Needle gauge: 24 G Needle length: 9 cm Additional Notes Kit expiration date 10/28/2022 and lot # [id number] Clear free flow CSF, negative heme, negative paresthesia Tolerated well and returned to supine position    

## 2020-11-12 NOTE — Plan of Care (Signed)
  Problem: Education: Goal: Knowledge of the prescribed therapeutic regimen will improve Outcome: Progressing Goal: Understanding of discharge needs will improve Outcome: Progressing   Problem: Activity: Goal: Ability to avoid complications of mobility impairment will improve Outcome: Progressing   Problem: Pain Management: Goal: Pain level will decrease with appropriate interventions Outcome: Progressing   

## 2020-11-12 NOTE — Discharge Instructions (Addendum)
Frank Aluisio, MD Total Joint Specialist [facility] [place] [address] ,  [address] [phone]  ANTERIOR APPROACH TOTAL HIP REPLACEMENT POSTOPERATIVE DIRECTIONS     Hip Rehabilitation, Guidelines Following Surgery  The results of a hip operation are greatly improved after range of motion and muscle strengthening exercises. Follow all safety measures which are given to protect your hip. If any of these exercises cause increased pain or swelling in your joint, decrease the amount until you are comfortable again. Then slowly increase the exercises. Call your caregiver if you have problems or questions.   BLOOD CLOT PREVENTION Take a 10 mg Xarelto once a day for three weeks following surgery. Then resume one 81 mg aspirin once a day. You may resume your vitamins/supplements once you have discontinued the Xarelto. Do not take any NSAIDs (Advil, Aleve, Ibuprofen, Meloxicam, etc.) until you have discontinued the Xarelto.   HOME CARE INSTRUCTIONS  Remove items at home which could result in a fall. This includes throw rugs or furniture in walking pathways.  ICE to the affected hip as frequently as 20-30 minutes an hour and then as needed for pain and swelling. Continue to use ice on the hip for pain and swelling from surgery. You may notice swelling that will progress down to the foot and ankle. This is normal after surgery. Elevate the leg when you are not up walking on it.   Continue to use the breathing machine which will help keep your temperature down.  It is common for your temperature to cycle up and down following surgery, especially at night when you are not up moving around and exerting yourself.  The breathing machine keeps your lungs expanded and your temperature down.  DIET You may resume your previous home diet once your are discharged from the hospital.  DRESSING / WOUND CARE / SHOWERING You have an adhesive waterproof bandage over the  incision. Leave this in place until your first follow-up appointment. Once you remove this you will not need to place another bandage.  You may begin showering 3 days following surgery, but do not submerge the incision under water.  ACTIVITY For the first 3-5 days, it is important to rest and keep the operative leg elevated. You should, as a general rule, rest for 50 minutes and walk/stretch for 10 minutes per hour. After 5 days, you may slowly increase activity as tolerated.  Perform the exercises you were provided twice a day for about 15-20 minutes each session. Begin these 2 days following surgery. Walk with your walker as instructed. Use the walker until you are comfortable transitioning to a cane. Walk with the cane in the opposite hand of the operative leg. You may discontinue the cane once you are comfortable and walking steadily. Avoid periods of inactivity such as sitting longer than an hour when not asleep. This helps prevent blood clots.  Do not drive a car for 6 weeks or until released by your surgeon.  Do not drive while taking narcotics.  TED HOSE STOCKINGS Wear the elastic stockings on both legs for three weeks following surgery during the day. You may remove them at night while sleeping.  WEIGHT BEARING Weight bearing as tolerated with assist device (walker, cane, etc) as directed, use it as long as suggested by your surgeon or therapist, typically at least 4-6 weeks.  POSTOPERATIVE CONSTIPATION PROTOCOL Constipation - defined medically as fewer than three stools per week and severe constipation as less than one stool per week.    One of the most common issues patients have following surgery is constipation.  Even if you have a regular bowel pattern at home, your normal regimen is likely to be disrupted due to multiple reasons following surgery.  Combination of anesthesia, postoperative narcotics, change in appetite and fluid intake all can affect your bowels.  In order to avoid  complications following surgery, here are some recommendations in order to help you during your recovery period.  Colace (docusate) - Pick up an over-the-counter form of Colace or another stool softener and take twice a day as long as you are requiring postoperative pain medications.  Take with a full glass of water daily.  If you experience loose stools or diarrhea, hold the colace until you stool forms back up.  If your symptoms do not get better within 1 week or if they get worse, check with your doctor. Dulcolax (bisacodyl) - Pick up over-the-counter and take as directed by the product packaging as needed to assist with the movement of your bowels.  Take with a full glass of water.  Use this product as needed if not relieved by Colace only.  MiraLax (polyethylene glycol) - Pick up over-the-counter to have on hand.  MiraLax is a solution that will increase the amount of water in your bowels to assist with bowel movements.  Take as directed and can mix with a glass of water, juice, soda, coffee, or tea.  Take if you go more than two days without a movement.Do not use MiraLax more than once per day. Call your doctor if you are still constipated or irregular after using this medication for 7 days in a row.  If you continue to have problems with postoperative constipation, please contact the office for further assistance and recommendations.  If you experience "the worst abdominal pain ever" or develop nausea or vomiting, please contact the office immediatly for further recommendations for treatment.  ITCHING  If you experience itching with your medications, try taking only a single pain pill, or even half a pain pill at a time.  You can also use Benadryl over the counter for itching or also to help with sleep.   MEDICATIONS See your medication summary on the "After Visit Summary" that the nursing staff will review with you prior to discharge.  You may have some home medications which will be placed on  hold until you complete the course of blood thinner medication.  It is important for you to complete the blood thinner medication as prescribed by your surgeon.  Continue your approved medications as instructed at time of discharge.  PRECAUTIONS If you experience chest pain or shortness of breath - call 911 immediately for transfer to the hospital emergency department.  If you develop a fever greater that 101 F, purulent drainage from wound, increased redness or drainage from wound, foul odor from the wound/dressing, or calf pain - CONTACT YOUR SURGEON.                                                   FOLLOW-UP APPOINTMENTS Make sure you keep all of your appointments after your operation with your surgeon and caregivers. You should call the office at the above phone number and make an appointment for approximately two weeks after the date of your surgery or on the date instructed by your surgeon outlined in   the "After Visit Summary".  RANGE OF MOTION AND STRENGTHENING EXERCISES  These exercises are designed to help you keep full movement of your hip joint. Follow your caregiver's or physical therapist's instructions. Perform all exercises about fifteen times, three times per day or as directed. Exercise both hips, even if you have had only one joint replacement. These exercises can be done on a training (exercise) mat, on the floor, on a table or on a bed. Use whatever works the best and is most comfortable for you. Use music or television while you are exercising so that the exercises are a pleasant break in your day. This will make your life better with the exercises acting as a break in routine you can look forward to.  Lying on your back, slowly slide your foot toward your buttocks, raising your knee up off the floor. Then slowly slide your foot back down until your leg is straight again.  Lying on your back spread your legs as far apart as you can without causing discomfort.  Lying on your side,  raise your upper leg and foot straight up from the floor as far as is comfortable. Slowly lower the leg and repeat.  Lying on your back, tighten up the muscle in the front of your thigh (quadriceps muscles). You can do this by keeping your leg straight and trying to raise your heel off the floor. This helps strengthen the largest muscle supporting your knee.  Lying on your back, tighten up the muscles of your buttocks both with the legs straight and with the knee bent at a comfortable angle while keeping your heel on the floor.   POST-OPERATIVE OPIOID TAPER INSTRUCTIONS: It is important to wean off of your opioid medication as soon as possible. If you do not need pain medication after your surgery it is ok to stop day one. Opioids include: Codeine, Hydrocodone(Norco, Vicodin), Oxycodone(Percocet, oxycontin) and hydromorphone amongst others.  Long term and even short term use of opiods can cause: Increased pain response Dependence Constipation Depression Respiratory depression And more.  Withdrawal symptoms can include Flu like symptoms Nausea, vomiting And more Techniques to manage these symptoms Hydrate well Eat regular healthy meals Stay active Use relaxation techniques(deep breathing, meditating, yoga) Do Not substitute Alcohol to help with tapering If you have been on opioids for less than two weeks and do not have pain than it is ok to stop all together.  Plan to wean off of opioids This plan should start within one week post op of your joint replacement. Maintain the same interval or time between taking each dose and first decrease the dose.  Cut the total daily intake of opioids by one tablet each day Next start to increase the time between doses. The last dose that should be eliminated is the evening dose.   IF YOU ARE TRANSFERRED TO A SKILLED REHAB FACILITY If the patient is transferred to a skilled rehab facility following release from the hospital, a list of the current  medications will be sent to the facility for the patient to continue.  When discharged from the skilled rehab facility, please have the facility set up the patient's Home Health Physical Therapy prior to being released. Also, the skilled facility will be responsible for providing the patient with their medications at time of release from the facility to include their pain medication, the muscle relaxants, and their blood thinner medication. If the patient is still at the rehab facility at time of the two week follow   up appointment, the skilled rehab facility will also need to assist the patient in arranging follow up appointment in our office and any transportation needs.  MAKE SURE YOU:  Understand these instructions.  Get help right away if you are not doing well or get worse.    DENTAL ANTIBIOTICS:  In most cases prophylactic antibiotics for Dental procdeures after total joint surgery are not necessary.  Exceptions are as follows:  1. History of prior total joint infection  2. Severely immunocompromised (Organ Transplant, cancer chemotherapy, Rheumatoid biologic meds such as Humera)  3. Poorly controlled diabetes (A1C &gt; 8.0, blood glucose over 200)  If you have one of these conditions, contact your surgeon for an antibiotic prescription, prior to your dental procedure.    Pick up stool softner and laxative for home use following surgery while on pain medications. Do not submerge incision under water. Please use good hand washing techniques while changing dressing each day. May shower starting three days after surgery. Please use a clean towel to pat the incision dry following showers. Continue to use ice for pain and swelling after surgery. Do not use any lotions or creams on the incision until instructed by your surgeon.  Information on my medicine - XARELTO (Rivaroxaban)    Why was Xarelto prescribed for you? Xarelto was prescribed for you to reduce the risk of blood  clots forming after orthopedic surgery. The medical term for these abnormal blood clots is venous thromboembolism (VTE).  What do you need to know about xarelto ? Take your Xarelto ONCE DAILY at the same time every day. You may take it either with or without food.  If you have difficulty swallowing the tablet whole, you may crush it and mix in applesauce just prior to taking your dose.  Take Xarelto exactly as prescribed by your doctor and DO NOT stop taking Xarelto without talking to the doctor who prescribed the medication.  Stopping without other VTE prevention medication to take the place of Xarelto may increase your risk of developing a clot.  After discharge, you should have regular check-up appointments with your healthcare provider that is prescribing your Xarelto.    What do you do if you miss a dose? If you miss a dose, take it as soon as you remember on the same day then continue your regularly scheduled once daily regimen the next day. Do not take two doses of Xarelto on the same day.   Important Safety Information A possible side effect of Xarelto is bleeding. You should call your healthcare provider right away if you experience any of the following: Bleeding from an injury or your nose that does not stop. Unusual colored urine (red or dark brown) or unusual colored stools (red or black). Unusual bruising for unknown reasons. A serious fall or if you hit your head (even if there is no bleeding).  Some medicines may interact with Xarelto and might increase your risk of bleeding while on Xarelto. To help avoid this, consult your healthcare provider or pharmacist prior to using any new prescription or non-prescription medications, including herbals, vitamins, non-steroidal anti-inflammatory drugs (NSAIDs) and supplements.  This website has more information on Xarelto: www.xarelto.com.    

## 2020-11-12 NOTE — Plan of Care (Signed)
Discussed with patient and wife about plan of care for post-op day 0.   Will continue to monitor patient.    SWhittemore, RN  

## 2020-11-13 DIAGNOSIS — M1612 Unilateral primary osteoarthritis, left hip: Secondary | ICD-10-CM | POA: Diagnosis not present

## 2020-11-13 LAB — BASIC METABOLIC PANEL
Anion gap: 9 (ref 5–15)
BUN: 13 mg/dL (ref 8–23)
CO2: 24 mmol/L (ref 22–32)
Calcium: 8.4 mg/dL — ABNORMAL LOW (ref 8.9–10.3)
Chloride: 103 mmol/L (ref 98–111)
Creatinine, Ser: 0.96 mg/dL (ref 0.61–1.24)
GFR, Estimated: >60 mL/min (ref >60)
Glucose, Bld: 206 mg/dL — ABNORMAL HIGH (ref 70–99)
Potassium: 4.1 mmol/L (ref 3.5–5.1)
Sodium: 136 mmol/L (ref 135–145)

## 2020-11-13 LAB — CBC
HCT: 39.4 % (ref 39.0–52.0)
Hemoglobin: 13.5 g/dL (ref 13.0–17.0)
MCH: 33.2 pg (ref 26.0–34.0)
MCHC: 34.3 g/dL (ref 30.0–36.0)
MCV: 96.8 fL (ref 80.0–100.0)
Platelets: 203 10*3/uL (ref 150–400)
RBC: 4.07 MIL/uL — ABNORMAL LOW (ref 4.22–5.81)
RDW: 12.5 % (ref 11.5–15.5)
WBC: 11.6 10*3/uL — ABNORMAL HIGH (ref 4.0–10.5)
nRBC: 0 % (ref 0.0–0.2)

## 2020-11-13 MED ORDER — HYDROCODONE-ACETAMINOPHEN 5-325 MG PO TABS: 1.0000 | ORAL_TABLET | Freq: Four times a day (QID) | ORAL | 0 refills | Status: DC | PRN

## 2020-11-13 MED ORDER — TRAMADOL HCL 50 MG PO TABS: 50.0000 mg | ORAL_TABLET | Freq: Four times a day (QID) | ORAL | 0 refills | Status: DC | PRN

## 2020-11-13 MED ORDER — RIVAROXABAN 10 MG PO TABS: 10.0000 mg | ORAL_TABLET | Freq: Every day | ORAL | 0 refills | Status: DC

## 2020-11-13 MED ORDER — METHOCARBAMOL 500 MG PO TABS: 500.0000 mg | ORAL_TABLET | Freq: Four times a day (QID) | ORAL | 0 refills | Status: DC | PRN

## 2020-11-13 NOTE — Plan of Care (Signed)
  Problem: Education: Goal: Knowledge of the prescribed therapeutic regimen will improve Outcome: Adequate for Discharge Goal: Understanding of discharge needs will improve Outcome: Adequate for Discharge Goal: Individualized Educational Video(s) Outcome: Adequate for Discharge   Problem: Activity: Goal: Ability to avoid complications of mobility impairment will improve Outcome: Adequate for Discharge Goal: Ability to tolerate increased activity will improve Outcome: Adequate for Discharge   Problem: Clinical Measurements: Goal: Postoperative complications will be avoided or minimized Outcome: Adequate for Discharge   Problem: Pain Management: Goal: Pain level will decrease with appropriate interventions Outcome: Adequate for Discharge   Problem: Skin Integrity: Goal: Will show signs of wound healing Outcome: Adequate for Discharge   Discharged home with wife.  Teaching done with patient and wife.  Written information given.

## 2020-11-13 NOTE — Evaluation (Signed)
Physical Therapy One TIme Evaluation Patient Details Name: Lawrence [name] MRN: [id number] DOB: January 24, 1950 Today's Date: 11/13/2020  History of Present Illness  Pt is a 70 year old male s/p L THA direct anterior approach on 11/12/20.  Clinical Impression  Patient evaluated by Physical Therapy with no further acute PT needs identified. All education has been completed and the patient has no further questions.  Pt ambulated in hallway, practiced safe stair technique, and performed LE exercises.  Pt provided with HEP handout and feels ready for d/c home today. See below for any follow-up Physical Therapy or equipment needs. PT is signing off. Thank you for this referral.      Recommendations for follow up therapy are one component of a multi-disciplinary discharge planning process, led by the attending physician.  Recommendations may be updated based on patient status, additional functional criteria and insurance authorization.  Follow Up Recommendations Follow physician's recommendations for discharge plan and follow up therapies (plan for HEP)    Assistance Recommended at Discharge None  Functional Status Assessment    Equipment Recommendations  None recommended by PT    Recommendations for Other Services       Precautions / Restrictions Precautions Precautions: Fall Restrictions Other Position/Activity Restrictions: WBAT      Mobility  Bed Mobility Overal bed mobility: Needs Assistance Bed Mobility: Supine to Sit     Supine to sit: Supervision;HOB elevated          Transfers Overall transfer level: Needs assistance Equipment used: Rolling walker (2 wheels) Transfers: Sit to/from Stand Sit to Stand: Min guard;Supervision           General transfer comment: verbal cues for UE and LE positioning    Ambulation/Gait Ambulation/Gait assistance: Min guard;Supervision Gait Distance (Feet): 240 Feet Assistive device: Rolling walker (2 wheels) Gait  Pattern/deviations: Step-to pattern;Step-through pattern;Decreased stance time - left     General Gait Details: verbal cues for sequence, pt utilized RW well and reports pain controlled  Stairs Stairs: Yes Stairs assistance: Min guard Stair Management: Forwards;Step to pattern;Two rails Number of Stairs: 3 General stair comments: verbal cues for safety and sequence  Wheelchair Mobility    Modified Rankin (Stroke Patients Only)       Balance                                             Pertinent Vitals/Pain Pain Assessment: 0-10 Pain Score: 3  Pain Location: left hip Pain Descriptors / Indicators: Aching;Sore Pain Intervention(s): Monitored during session;Repositioned    Home Living Family/patient expects to be discharged to:: Private residence Living Arrangements: Spouse/significant other   Type of Home: House Home Access: Stairs to enter Entrance Stairs-Rails: Left;Right;Can reach both Secretary/administrator of Steps: 3   Home Layout: One level Home Equipment: Agricultural consultant (2 wheels)      Prior Function Prior Level of Function : Independent/Modified Independent                     Hand Dominance        Extremity/Trunk Assessment        Lower Extremity Assessment Lower Extremity Assessment: LLE deficits/detail LLE Deficits / Details: anticipated post op hip weakness, grossly 2+/5 hip strength    Cervical / Trunk Assessment Cervical / Trunk Assessment: Normal  Communication   Communication: No difficulties  Cognition Arousal/Alertness: Awake/alert Behavior During  Therapy: WFL for tasks assessed/performed Overall Cognitive Status: Within Functional Limits for tasks assessed                                          General Comments      Exercises Total Joint Exercises Ankle Circles/Pumps: AROM;Both;10 reps Quad Sets: AROM;Both;10 reps Heel Slides: AAROM;Left;10 reps Hip ABduction/ADduction:  AROM;Left;10 reps;Standing;Supine Long Arc Quad: AROM;Left;Seated;10 reps Knee Flexion: AROM;Left;10 reps;Standing Marching in Standing: AROM;Left;Standing;10 reps Standing Hip Extension: AROM;Standing;Left;10 reps   Assessment/Plan    PT Assessment Patient does not need any further PT services  PT Problem List Decreased strength;Decreased activity tolerance;Decreased balance;Decreased mobility;Decreased knowledge of precautions;Pain;Decreased knowledge of use of DME;Decreased range of motion       PT Treatment Interventions      PT Goals (Current goals can be found in the Care Plan section)  Acute Rehab PT Goals PT Goal Formulation: All assessment and education complete, DC therapy    Frequency     Barriers to discharge        Co-evaluation               AM-PAC PT "6 Clicks" Mobility  Outcome Measure Help needed turning from your back to your side while in a flat bed without using bedrails?: None Help needed moving from lying on your back to sitting on the side of a flat bed without using bedrails?: None Help needed moving to and from a bed to a chair (including a wheelchair)?: None Help needed standing up from a chair using your arms (e.g., wheelchair or bedside chair)?: None Help needed to walk in hospital room?: A Little Help needed climbing 3-5 steps with a railing? : A Little 6 Click Score: 22    End of Session Equipment Utilized During Treatment: Gait belt Activity Tolerance: Patient tolerated treatment well Patient left: in chair;with call bell/phone within reach;with chair alarm set Nurse Communication: Mobility status PT Visit Diagnosis: Difficulty in walking, not elsewhere classified (R26.2)    Time: 9509-3267 PT Time Calculation (min) (ACUTE ONLY): 16 min   Charges:   PT Evaluation $PT Eval Low Complexity: 1 Low        Kati PT, DPT Acute Rehabilitation Services Pager: [phone] Office: [phone]   [name] 11/13/2020, 4:27  PM

## 2020-11-13 NOTE — Progress Notes (Signed)
Subjective: 1 Day Post-Op Procedure(s) (LRB): TOTAL HIP ARTHROPLASTY ANTERIOR APPROACH (Left) Patient reports pain as mild.   Patient seen in rounds by Dr. Lequita Halt. Patient is well, and has had no acute complaints or problems. Denies SOB, chest pain, or calf pain. No acute overnight events. Will begin therapy today. Foley pulled this am.    Objective: Vital signs in last 24 hours: Temp:  [97.8 F (36.6 C)-98.7 F (37.1 C)] 98 F (36.7 C) (11/03 0536) Pulse Rate:  [54-84] 84 (11/03 0536) Resp:  [10-20] 16 (11/03 0536) BP: (136-161)/(86-98) 140/97 (11/03 0536) SpO2:  [94 %-100 %] 96 % (11/03 0536) Weight:  [98.4 kg] 98.4 kg (11/02 1137)  Intake/Output from previous day:  Intake/Output Summary (Last 24 hours) at 11/13/2020 0719 Last data filed at 11/13/2020 0546 Gross per 24 hour  Intake 3842.5 ml  Output 2700 ml  Net 1142.5 ml     Intake/Output this shift: No intake/output data recorded.  Labs: Recent Labs    11/13/20 0318  HGB 13.5   Recent Labs    11/13/20 0318  WBC 11.6*  RBC 4.07*  HCT 39.4  PLT 203   Recent Labs    11/13/20 0318  NA 136  K 4.1  CL 103  CO2 24  BUN 13  CREATININE 0.96  GLUCOSE 206*  CALCIUM 8.4*   No results for input(s): LABPT, INR in the last 72 hours.  Exam: General - Patient is Alert and Oriented Extremity - Neurologically intact Neurovascular intact Intact pulses distally Dorsiflexion/Plantar flexion intact Dressing - dressing C/D/I Motor Function - intact, moving foot and toes well on exam.   Past Medical History:  Diagnosis Date   Anxiety    Arthritis    hips   CAD (coronary artery disease)    a. NSTEMI 1/14 - LHC: Ostial/proximal LAD 50%, distal LAD occluded near the apex with faint left to left collaterals, EF 50% with apical akinesis.;  b. Echo 1/14: Severe LVH, EF 50-55%, apical HK, mild LAE;  c. ETT-Myoview 3/14: EF 69%, apical scar, no ischemia;   d. ETT 12/15: no ST ischemic ST changes    Coronary artery  disease involving native heart 11/29/2013   Occluded apical LAD. 50% proximal to mid LAD. Apical infarct January 2014.    GERD (gastroesophageal reflux disease)    OTC   History of MI (myocardial infarction)    HTN (hypertension) 01/23/2012   Hyperlipidemia 01/24/2012   LDL > 160 on admission    Hyperlipidemia LDL goal < 70    Hypertension    Left ventricular aneurysm    NSTEMI. old. 01/23/2012   Obstructive sleep apnea 01/26/2012   Greater than 20 year history    Pneumonia    many years ago   Sleep apnea    on CPAP    Assessment/Plan: 1 Day Post-Op Procedure(s) (LRB): TOTAL HIP ARTHROPLASTY ANTERIOR APPROACH (Left) Principal Problem:   OA (osteoarthritis) of hip Active Problems:   S/P total left hip arthroplasty  Estimated body mass index is 30.27 kg/m as calculated from the following:   Height as of this encounter: 5\' 11"  (1.803 m).   Weight as of this encounter: 98.4 kg. Up with therapy  DVT Prophylaxis - Xarelto and TED hose Weight bearing as tolerated. Continue therapy.  Plan is to go Home after hospital stay.   Plan for two sessions with PT today, and if meeting goals, will plan for discharge this afternoon.   Patient to follow up in two weeks with  Dr. Lequita Halt in clinic.   The PDMP database was reviewed today prior to any opioid medications being prescribed to this patient.Nelia Shi, MBA, PA-C Orthopedic Surgery [phone] 11/13/2020, 7:19 AM

## 2020-11-13 NOTE — TOC Transition Note (Signed)
Transition of Care [facility]) - CM/SW Discharge Note   Patient Details  Name: Lawrence [name] MRN: [id number] Date of Birth: Nov 10, 1950  [facility] CM/SW Contact:  Lennart Pall, LCSW Phone Number: 11/13/2020, 10:42 AM   Clinical Narrative:    Met with pt and confirming he has all needed DME.  Plan for HEP.  No further TOC needs.   Final next level of care: Home/Self Care Barriers to Discharge: No Barriers Identified   Patient Goals and CMS Choice Patient states their goals for this hospitalization and ongoing recovery are:: return home      Discharge Placement                       Discharge Plan and Services                DME Arranged: N/A DME Agency: NA                  Social Determinants of Health (SDOH) Interventions     Readmission Risk Interventions No flowsheet data found.

## 2020-11-17 ENCOUNTER — Encounter (HOSPITAL_COMMUNITY): Payer: Self-pay | Admitting: Orthopedic Surgery

## 2020-11-19 NOTE — Discharge Summary (Signed)
Physician Discharge Summary   Patient ID: Lawrence [name] MRN: [id number] DOB/AGE: Feb 07, 1950 70 y.o.  Admit date: 11/12/2020 Discharge date: 11/13/2020  Primary Diagnosis:  s/p Left THA  Admission Diagnoses:  Past Medical History:  Diagnosis Date   Anxiety    Arthritis    hips   CAD (coronary artery disease)    a. NSTEMI 1/14 - LHC: Ostial/proximal LAD 50%, distal LAD occluded near the apex with faint left to left collaterals, EF 50% with apical akinesis.;  b. Echo 1/14: Severe LVH, EF 50-55%, apical HK, mild LAE;  c. ETT-Myoview 3/14: EF 69%, apical scar, no ischemia;   d. ETT 12/15: no ST ischemic ST changes    Coronary artery disease involving native heart 11/29/2013   Occluded apical LAD. 50% proximal to mid LAD. Apical infarct January 2014.    GERD (gastroesophageal reflux disease)    OTC   History of MI (myocardial infarction)    HTN (hypertension) 01/23/2012   Hyperlipidemia 01/24/2012   LDL > 160 on admission    Hyperlipidemia LDL goal < 70    Hypertension    Left ventricular aneurysm    NSTEMI. old. 01/23/2012   Obstructive sleep apnea 01/26/2012   Greater than 20 year history    Pneumonia    many years ago   Sleep apnea    on CPAP   Discharge Diagnoses:   Principal Problem:   OA (osteoarthritis) of hip Active Problems:   S/P total left hip arthroplasty  Estimated body mass index is 30.27 kg/m as calculated from the following:   Height as of this encounter: 5\' 11"  (1.803 m).   Weight as of this encounter: 98.4 kg.  Procedure:  Procedure(s) (LRB): TOTAL HIP ARTHROPLASTY ANTERIOR APPROACH (Left)   Consults: None  HPI: Lawrence [name] is a 70 y.o. male who has advanced end-  stage arthritis of their Left  hip with progressively worsening pain and  dysfunction.The patient has failed nonoperative management and presents for  total hip arthroplasty.   Laboratory Data: Admission on 11/12/2020, Discharged on 11/13/2020  Component Date Value Ref Range  Status   ABO/RH(D) 11/12/2020    Final                   Value:O POS Performed at [facility], [address] [place], [place] [address]    WBC 11/13/2020 11.6 (A)  4.0 - 10.5 K/uL Final   RBC 11/13/2020 4.07 (A)  4.22 - 5.81 MIL/uL Final   Hemoglobin 11/13/2020 13.5  13.0 - 17.0 g/dL Final   HCT 60/15/6153 39.4  39.0 - 52.0 % Final   MCV 11/13/2020 96.8  80.0 - 100.0 fL Final   MCH 11/13/2020 33.2  26.0 - 34.0 pg Final   MCHC 11/13/2020 34.3  30.0 - 36.0 g/dL Final   RDW 79/43/2761 12.5  11.5 - 15.5 % Final   Platelets 11/13/2020 203  150 - 400 K/uL Final   nRBC 11/13/2020 0.0  0.0 - 0.2 % Final   Performed at [facility], [address] [place], [place] [address]   Sodium 11/13/2020 136  135 - 145 mmol/L Final   Potassium 11/13/2020 4.1  3.5 - 5.1 mmol/L Final   Chloride 11/13/2020 103  98 - 111 mmol/L Final   CO2 11/13/2020 24  22 - 32 mmol/L Final   Glucose, Bld 11/13/2020 206 (A)  70 - 99 mg/dL Final   Glucose reference range applies only to samples taken after fasting  for at least 8 hours.   BUN 11/13/2020 13  8 - 23 mg/dL Final   Creatinine, Ser 11/13/2020 0.96  0.61 - 1.24 mg/dL Final   Calcium 81/19/1478 8.4 (A)  8.9 - 10.3 mg/dL Final   GFR, Estimated 11/13/2020 >60  >60 mL/min Final   Comment: (NOTE) Calculated using the CKD-EPI Creatinine Equation (2021)    Anion gap 11/13/2020 9  5 - 15 Final   Performed at [facility], [address] [place], [place] [address]  Orders Only on 11/10/2020  Component Date Value Ref Range Status   SARS Coronavirus 2 11/10/2020 RESULT: NEGATIVE   Final   Comment: RESULT: NEGATIVESARS-CoV-2 INTERPRETATION:A NEGATIVE  test result means that SARS-CoV-2 RNA was not present in the specimen above the limit of detection of this test. This does not preclude a possible SARS-CoV-2 infection and should not be used as the  sole basis for patient management decisions. Negative results must  be combined with clinical observations, patient history, and epidemiological information. Optimum specimen types and timing for peak viral levels during infections caused by SARS-CoV-2  have not been determined. Collection of multiple specimens or types of specimens may be necessary to detect virus. Improper specimen collection and handling, sequence variability under primers/probes, or organism present below the limit of detection may  lead to false negative results. Positive and negative predictive values of testing are highly dependent on prevalence. False negative test results are more likely when prevalence of disease is high.The expected result is NEGATIVE.Fact S                          heet for  Healthcare Providers: [employer] Sheet for Patients: https://poole-freeman.org/ Reference Range - Negative   Hospital Outpatient Visit on 10/31/2020  Component Date Value Ref Range Status   MRSA, PCR 10/31/2020 NEGATIVE  NEGATIVE Final   Staphylococcus aureus 10/31/2020 NEGATIVE  NEGATIVE Final   Comment: (NOTE) The Xpert SA Assay (FDA approved for NASAL specimens in patients 26 years of age and older), is one component of a comprehensive surveillance program. It is not intended to diagnose infection nor to guide or monitor treatment. Performed at [facility], [address] [place], [place] [address]    WBC 10/31/2020 5.4  4.0 - 10.5 K/uL Final   RBC 10/31/2020 4.65  4.22 - 5.81 MIL/uL Final   Hemoglobin 10/31/2020 15.4  13.0 - 17.0 g/dL Final   HCT 57/84/6962 45.7  39.0 - 52.0 % Final   MCV 10/31/2020 98.3  80.0 - 100.0 fL Final   MCH 10/31/2020 33.1  26.0 - 34.0 pg Final   MCHC 10/31/2020 33.7  30.0 - 36.0 g/dL Final   RDW 95/28/4132 13.0  11.5 - 15.5 % Final   Platelets 10/31/2020 213  150 - 400 K/uL Final   nRBC 10/31/2020 0.0  0.0 - 0.2 % Final   Performed at [facility], [address], [place], [place] [address]   Sodium 10/31/2020 140  135 - 145 mmol/L Final   Potassium 10/31/2020 4.2  3.5 - 5.1 mmol/L Final   Chloride 10/31/2020 105  98 - 111 mmol/L Final   CO2 10/31/2020 27  22 - 32 mmol/L Final   Glucose, Bld 10/31/2020 91  70 - 99 mg/dL Final   Glucose reference range applies only to samples taken after fasting for at least 8 hours.   BUN 10/31/2020 18  8 - 23 mg/dL Final  Creatinine, Ser 10/31/2020 0.91  0.61 - 1.24 mg/dL Final   Calcium 56/97/9480 9.1  8.9 - 10.3 mg/dL Final   Total Protein 16/55/3748 7.3  6.5 - 8.1 g/dL Final   Albumin 27/07/8673 4.0  3.5 - 5.0 g/dL Final   AST 44/92/0100 29  15 - 41 U/L Final   ALT 10/31/2020 27  0 - 44 U/L Final   Alkaline Phosphatase 10/31/2020 59  38 - 126 U/L Final   Total Bilirubin 10/31/2020 0.9  0.3 - 1.2 mg/dL Final   GFR, Estimated 10/31/2020 >60  >60 mL/min Final   Comment: (NOTE) Calculated using the CKD-EPI Creatinine Equation (2021)    Anion gap 10/31/2020 8  5 - 15 Final   Performed at [facility], [address] [place], [place] [address]   ABO/RH(D) 10/31/2020 O POS   Final   Antibody Screen 10/31/2020 NEG   Final   Sample Expiration 10/31/2020 11/14/2020,2359   Final   Extend sample reason 10/31/2020    Final                   Value:NO TRANSFUSIONS OR PREGNANCY IN THE PAST 3 MONTHS Performed at [facility], [address] [place], [place] [address]    Prothrombin Time 10/31/2020 13.3  11.4 - 15.2 seconds Final   INR 10/31/2020 1.0  0.8 - 1.2 Final   Comment: (NOTE) INR goal varies based on device and disease states. Performed at [facility], [address] [place], [place] [address]      X-Rays:DG Pelvis Portable  Result Date: 11/12/2020 CLINICAL DATA:  Status post left hip arthroplasty. EXAM: PORTABLE PELVIS 1-2 VIEWS COMPARISON:  Intraoperative imaging earlier today. FINDINGS: From projection of the pelvis and proximal femurs  demonstrate normal alignment in the frontal projection after left hip arthroplasty. A right hip arthroplasty is also present. The bony pelvis is intact. IMPRESSION: Normal alignment following left hip arthroplasty. Electronically Signed   By: Irish Lack M.D.   On: 11/12/2020 15:26   DG C-Arm 1-60 Min-No Report  Result Date: 11/12/2020 CLINICAL DATA:  Left hip total arthroplasty. EXAM: OPERATIVE LEFT HIP (WITH PELVIS IF PERFORMED) TECHNIQUE: Fluoroscopic spot image(s) were submitted for interpretation post-operatively. COMPARISON:  None. FLUOROSCOPY TIME:  11 seconds FINDINGS: Intraoperative fluoroscopic images demonstrate left hip total arthroplasty. No obvious perihardware fracture or component malpositioning. Pre-existing right hip arthroplasty. IMPRESSION: Intraoperative fluoroscopic images demonstrate left hip total arthroplasty. No obvious perihardware fracture or component malpositioning. Electronically Signed   By: Jearld Lesch M.D.   On: 11/12/2020 14:46   DG C-Arm 1-60 Min-No Report  Result Date: 11/12/2020 CLINICAL DATA:  Left hip total arthroplasty. EXAM: OPERATIVE LEFT HIP (WITH PELVIS IF PERFORMED) TECHNIQUE: Fluoroscopic spot image(s) were submitted for interpretation post-operatively. COMPARISON:  None. FLUOROSCOPY TIME:  11 seconds FINDINGS: Intraoperative fluoroscopic images demonstrate left hip total arthroplasty. No obvious perihardware fracture or component malpositioning. Pre-existing right hip arthroplasty. IMPRESSION: Intraoperative fluoroscopic images demonstrate left hip total arthroplasty. No obvious perihardware fracture or component malpositioning. Electronically Signed   By: Jearld Lesch M.D.   On: 11/12/2020 14:46   DG HIP OPERATIVE UNILAT W OR W/O PELVIS LEFT  Result Date: 11/12/2020 CLINICAL DATA:  Left hip total arthroplasty. EXAM: OPERATIVE LEFT HIP (WITH PELVIS IF PERFORMED) TECHNIQUE: Fluoroscopic spot image(s) were submitted for interpretation post-operatively.  COMPARISON:  None. FLUOROSCOPY TIME:  11 seconds FINDINGS: Intraoperative fluoroscopic images demonstrate left hip total arthroplasty. No obvious perihardware fracture or component malpositioning. Pre-existing  right hip arthroplasty. IMPRESSION: Intraoperative fluoroscopic images demonstrate left hip total arthroplasty. No obvious perihardware fracture or component malpositioning. Electronically Signed   By: Jearld Lesch M.D.   On: 11/12/2020 14:46    EKG: Orders placed or performed in visit on 10/15/20   EKG 12-Lead     Hospital Course: Lawrence [name] is a 70 y.o. who was admitted to [facility]. They were brought to the operating room on 11/12/2020 and underwent Procedure(s): TOTAL HIP ARTHROPLASTY ANTERIOR APPROACH.  Patient tolerated the procedure well and was later transferred to the recovery room and then to the orthopaedic floor for postoperative care. They were given PO and IV analgesics for pain control following their surgery. They were given 24 hours of postoperative antibiotics of  Anti-infectives (From admission, onward)    Start     Dose/Rate Route Frequency Ordered Stop   11/12/20 2000  ceFAZolin (ANCEF) IVPB 2g/100 mL premix        2 g 200 mL/hr over 30 Minutes Intravenous Every 6 hours 11/12/20 1649 11/13/20 0258   11/12/20 1130  ceFAZolin (ANCEF) IVPB 2g/100 mL premix        2 g 200 mL/hr over 30 Minutes Intravenous On call to O.R. 11/12/20 1115 11/12/20 1328      and started on DVT prophylaxis in the form of Xarelto and TED hose.   PT and OT were ordered for total joint protocol. Discharge planning consulted to help with postop disposition and equipment needs.  Patient had an uneventful night on the evening of surgery. They started to get up OOB with therapy on 11/13/2020. Pt was seen during rounds and was ready to go home pending progress with therapy. He worked with therapy on POD #1 and was meeting goals. Pt was discharged to home later that day in stable  condition.  Diet: Regular diet Activity: WBAT Follow-up: in 2 weeks Disposition: Home Discharged Condition: good   Discharge Instructions     Call MD / Call 911   Complete by: As directed    If you experience chest pain or shortness of breath, CALL 911 and be transported to the hospital emergency room.  If you develope a fever above 101 F, pus (white drainage) or increased drainage or redness at the wound, or calf pain, call your surgeon's office.   Change dressing   Complete by: As directed    You have an adhesive waterproof bandage over the incision. Leave this in place until your first follow-up appointment. Once you remove this you will not need to place another bandage.   Constipation Prevention   Complete by: As directed    Drink plenty of fluids.  Prune juice may be helpful.  You may use a stool softener, such as Colace (over the counter) 100 mg twice a day.  Use MiraLax (over the counter) for constipation as needed.   Diet - low sodium heart healthy   Complete by: As directed    Do not sit on low chairs, stoools or toilet seats, as it may be difficult to get up from low surfaces   Complete by: As directed    Driving restrictions   Complete by: As directed    No driving for two weeks   Post-operative opioid taper instructions:   Complete by: As directed    POST-OPERATIVE OPIOID TAPER INSTRUCTIONS: It is important to wean off of your opioid medication as soon as possible. If you do not need pain medication after your surgery it is  ok to stop day one. Opioids include: Codeine, Hydrocodone(Norco, Vicodin), Oxycodone(Percocet, oxycontin) and hydromorphone amongst others.  Long term and even short term use of opiods can cause: Increased pain response Dependence Constipation Depression Respiratory depression And more.  Withdrawal symptoms can include Flu like symptoms Nausea, vomiting And more Techniques to manage these symptoms Hydrate well Eat regular healthy  meals Stay active Use relaxation techniques(deep breathing, meditating, yoga) Do Not substitute Alcohol to help with tapering If you have been on opioids for less than two weeks and do not have pain than it is ok to stop all together.  Plan to wean off of opioids This plan should start within one week post op of your joint replacement. Maintain the same interval or time between taking each dose and first decrease the dose.  Cut the total daily intake of opioids by one tablet each day Next start to increase the time between doses. The last dose that should be eliminated is the evening dose.      TED hose   Complete by: As directed    Use stockings (TED hose) for three weeks on both leg(s).  You may remove them at night for sleeping.   Weight bearing as tolerated   Complete by: As directed       Allergies as of 11/13/2020   No Known Allergies      Medication List     STOP taking these medications    acetaminophen 500 MG tablet Commonly known as: TYLENOL   aspirin EC 81 MG tablet   multivitamin with minerals Tabs tablet   vitamin C 1000 MG tablet   Vitamin D3 125 MCG (5000 UT) Tabs   ZINC 15 PO       TAKE these medications    ALPRAZolam 0.25 MG tablet Commonly known as: XANAX Take 1 tablet (0.25 mg total) by mouth at bedtime as needed for anxiety.   atorvastatin 80 MG tablet Commonly known as: LIPITOR Take 1 tablet (80 mg total) by mouth daily. What changed: when to take this   co-enzyme Q-10 50 MG capsule Take 50 mg by mouth every evening.   hydrochlorothiazide 12.5 MG capsule Commonly known as: MICROZIDE Take 1 capsule (12.5 mg total) by mouth daily.   HYDROcodone-acetaminophen 5-325 MG tablet Commonly known as: NORCO/VICODIN Take 1-2 tablets by mouth every 6 (six) hours as needed for severe pain.   lisinopril 20 MG tablet Commonly known as: ZESTRIL Take 1 tablet (20 mg total) by mouth daily. What changed:  how much to take when to take this    methocarbamol 500 MG tablet Commonly known as: ROBAXIN Take 1 tablet (500 mg total) by mouth every 6 (six) hours as needed for muscle spasms.   metoprolol succinate 50 MG 24 hr tablet Commonly known as: TOPROL-XL TAKE 1 TABLET BY MOUTH EVERY DAY   nitroGLYCERIN 0.4 MG SL tablet Commonly known as: NITROSTAT Place 1 tablet (0.4 mg total) under the tongue every 5 (five) minutes as needed for chest pain.   rivaroxaban 10 MG Tabs tablet Commonly known as: XARELTO Take 1 tablet (10 mg total) by mouth daily with breakfast. Then restart daily 81mg  Aspirin.   traMADol 50 MG tablet Commonly known as: ULTRAM Take 1-2 tablets (50-100 mg total) by mouth every 6 (six) hours as needed for moderate pain.               Discharge Care Instructions  (From admission, onward)  Start     Ordered   11/13/20 0000  Weight bearing as tolerated        11/13/20 0728   11/13/20 0000  Change dressing       Comments: You have an adhesive waterproof bandage over the incision. Leave this in place until your first follow-up appointment. Once you remove this you will not need to place another bandage.   11/13/20 0728            Follow-up Information     Ollen Gross, MD Follow up in 2 week(s).   Specialty: Orthopedic Surgery Contact information: [address] [place] [address] [phone]                 Signed: Nelia Shi, MBA, PA-C Orthopedic Surgery 11/19/2020, 10:42 AM

## 2020-12-10 ENCOUNTER — Other Ambulatory Visit: Payer: Self-pay | Admitting: Interventional Cardiology

## 2021-01-14 ENCOUNTER — Ambulatory Visit: Payer: Medicare Other | Admitting: Physician Assistant

## 2021-01-14 ENCOUNTER — Ambulatory Visit: Payer: Medicare Other | Admitting: Interventional Cardiology

## 2021-01-17 ENCOUNTER — Other Ambulatory Visit: Payer: Self-pay | Admitting: Physician Assistant

## 2021-01-29 ENCOUNTER — Other Ambulatory Visit: Payer: Self-pay | Admitting: Interventional Cardiology

## 2021-02-22 ENCOUNTER — Other Ambulatory Visit: Payer: Self-pay | Admitting: Interventional Cardiology

## 2021-02-24 MED ORDER — LISINOPRIL 20 MG PO TABS: 20.0000 mg | ORAL_TABLET | Freq: Every day | ORAL | 2 refills | Status: DC

## 2021-02-25 ENCOUNTER — Other Ambulatory Visit: Payer: Medicare Other

## 2021-05-15 ENCOUNTER — Telehealth: Payer: Self-pay

## 2021-05-15 NOTE — Telephone Encounter (Signed)
Need more information from requesting provider. The office does not open up until; will try then. ? ?

## 2021-05-15 NOTE — Telephone Encounter (Signed)
Reviewed paperwork and  reached out to the requesting provider. I was informed that medical records are being requested then clearance will be requested. ?

## 2021-05-29 ENCOUNTER — Telehealth: Payer: Self-pay | Admitting: Interventional Cardiology

## 2021-05-29 NOTE — Telephone Encounter (Signed)
ADDENDUM: I RECEIVED THE FAX FOR THE CLEARANCE REQUEST FOR SONO BELLO.    PROCEDURE DATE: ON CLEARANCE REQUEST STATES 07/16/21   ANESTHESIA: LOCAL LIDOCAINE; TUMESCENT SOLUTION OF LIDOCAINE AND EPINEPHRINE INFUSED INTO THE TISSUES.  ANESTHESIA TO BE USED WILL: NOTES ON CLEARANCE STATES:  THIS IS AN AWAKE PROCEDURE. WE TYPICALLY USE ORAL NARCOTIC, ORAL BENZODIAZEPINE, ORAL ANTI-EMETICS AND ORAL ANTIBIOTICS. ON OCCASION, WE MAY USE A MUSCLE RELAXER FOR LONGER PROCEDURES   WE DO NOT USE IV MEDICATION, CONSCIOUS SEDATION OR GENERAL ANESTHESIA.

## 2021-05-29 NOTE — Telephone Encounter (Signed)
   Pre-operative Risk Assessment    Patient Name: Lawrence [name]  DOB: 02/05/1950 MRN: [id number]      Request for Surgical Clearance    Procedure:   lipo suction   Date of Surgery:  Clearance 06/16/21                                 Surgeon:  Dr. Brennan Bailey Surgeon's Group or Practice Name:  [facility] Phone number:  [phone] Fax number:  [phone]   Type of Clearance Requested:   - Medical    Type of Anesthesia:  None    Additional requests/questions:      SignedFilomena Jungling   05/29/2021, 3:09 PM

## 2021-05-29 NOTE — Telephone Encounter (Signed)
    Name: Lawrence [name]  DOB: [birth year]  MRN: [id number]  Primary Cardiologist: [name], MD  Last OV 10/2020.  Preoperative team, please contact this patient and set up a phone call appointment for further preoperative risk assessment. Please obtain consent and complete medication review. Thank you for your help.  Xarelto is holdover on med list from ortho DVT ppx post hip surgery 11/2020, not on this for cardiac reasons, can review if he is still taking at visit.   Laurann Montana, PA-C 05/29/2021, 5:40 PM [facility] [address] [place], [place] [address]

## 2021-06-01 NOTE — Telephone Encounter (Signed)
Left message for the pt to call back to schedule tele pre op appt

## 2021-06-09 NOTE — Telephone Encounter (Signed)
Left message x 2 for pt call back to schedule a tele pre op appt.

## 2021-06-11 NOTE — Telephone Encounter (Signed)
Left message x 3 for tele pre op appt. I will send out a letter to the pt asking him to call the office for a telephone appt for pre op clearance. I will update the requesting office that we are trying to schedule a telephone appt with our pre op team though pt has not called back. I will remove from the pre op call back pool at this time. Will re-address once the pt calls back for appt.

## 2021-06-16 NOTE — Telephone Encounter (Signed)
Returned patient call but no answer. I left a message for the patient to return my call to schedule a pre-op tele visit.

## 2021-06-16 NOTE — Telephone Encounter (Signed)
Patient returning call.

## 2021-06-16 NOTE — Telephone Encounter (Signed)
Spoke with patient who is agreeable to do a pre-op tele visit on 6/12 @ 10 am. Med rec and consent are done.  I will faxed recommendations to requesting provider's office.

## 2021-06-16 NOTE — Telephone Encounter (Signed)
  Patient Consent for Virtual Visit        Lawrence [name] has provided verbal consent on 06/16/2021 for a virtual visit (video or telephone).   CONSENT FOR VIRTUAL VISIT FOR:  Lawrence [name]  By participating in this virtual visit I agree to the following:  I hereby voluntarily request, consent and authorize [facility] and its employed or contracted physicians, physician assistants, nurse practitioners or other licensed health care professionals (the Practitioner), to provide me with telemedicine health care services (the "Services") as deemed necessary by the treating Practitioner. I acknowledge and consent to receive the Services by the Practitioner via telemedicine. I understand that the telemedicine visit Lawrence involve communicating with the Practitioner through live audiovisual communication technology and the disclosure of certain medical information by electronic transmission. I acknowledge that I have been given the opportunity to request an in-person assessment or other available alternative prior to the telemedicine visit and am voluntarily participating in the telemedicine visit.  I understand that I have the right to withhold or withdraw my consent to the use of telemedicine in the course of my care at any time, without affecting my right to future care or treatment, and that the Practitioner or I may terminate the telemedicine visit at any time. I understand that I have the right to inspect all information obtained and/or recorded in the course of the telemedicine visit and may receive copies of available information for a reasonable fee.  I understand that some of the potential risks of receiving the Services via telemedicine include:  Delay or interruption in medical evaluation due to technological equipment failure or disruption; Information transmitted may not be sufficient (e.g. poor resolution of images) to allow for appropriate medical decision making by the Practitioner;  and/or  In rare instances, security protocols could fail, causing a breach of personal health information.  Furthermore, I acknowledge that it is my responsibility to provide information about my medical history, conditions and care that is complete and accurate to the best of my ability. I acknowledge that Practitioner's advice, recommendations, and/or decision may be based on factors not within their control, such as incomplete or inaccurate data provided by me or distortions of diagnostic images or specimens that may result from electronic transmissions. I understand that the practice of medicine is not an exact science and that Practitioner makes no warranties or guarantees regarding treatment outcomes. I acknowledge that a copy of this consent can be made available to me via my patient portal [facility] MyChart), or I can request a printed copy by calling the office of [facility].    I understand that my insurance Lawrence be billed for this visit.   I have read or had this consent read to me. I understand the contents of this consent, which adequately explains the benefits and risks of the Services being provided via telemedicine.  I have been provided ample opportunity to ask questions regarding this consent and the Services and have had my questions answered to my satisfaction. I give my informed consent for the services to be provided through the use of telemedicine in my medical care

## 2021-06-16 NOTE — Telephone Encounter (Signed)
Patient is returning call.  °

## 2021-06-22 ENCOUNTER — Encounter: Payer: Medicare Other | Admitting: Nurse Practitioner

## 2021-06-22 ENCOUNTER — Telehealth: Payer: Self-pay | Admitting: Nurse Practitioner

## 2021-06-22 NOTE — Telephone Encounter (Signed)
Patient called and wants to know if he is cleared for the procedure he is having on 06/25/21. Please call back

## 2021-06-22 NOTE — Telephone Encounter (Signed)
I called the pt back and informed him that he missed the telephone pre op appt that we had set up this morning for him. Pt stated he had it on his calendar and just forgot. Pt was asking if he was cleared. I informed no not yet as we will still need to have the tele pre op appt. Pt agreeable to plan of care and has been scheduled for tele appt 06/23/21 @ 4 pm. I cancelled the check in for the appt that was supposed to be today.   I will update the pre op provider that the pt has been rescheduled for tomorrow.

## 2021-06-22 NOTE — Telephone Encounter (Signed)
I called the pt back and informed him that he missed the telephone pre op appt that we had set up this morning for him. Pt stated he had it on his calendar and just forgot. Pt was asking if he was cleared. I informed no not yet as we will still need to have the tele pre op appt. Pt agreeable to plan of care and has been scheduled for tele appt 06/23/21 @ 4 pm. I cancelled the check in for the appt that was supposed to be today.    I will update the pre op provider that the pt has been rescheduled for tomorrow.    I will also update the surgeon office pt missed his telephone appt today for pre op clearance and has been rescheduled for 06/23/21 @ 4 pm.

## 2021-06-22 NOTE — Progress Notes (Signed)
This encounter was created in error - please disregard.  This encounter was created in error - please disregard.

## 2021-06-23 ENCOUNTER — Encounter: Payer: Self-pay | Admitting: Nurse Practitioner

## 2021-06-23 ENCOUNTER — Ambulatory Visit (INDEPENDENT_AMBULATORY_CARE_PROVIDER_SITE_OTHER): Payer: Medicare Other | Admitting: Nurse Practitioner

## 2021-06-23 DIAGNOSIS — Z01810 Encounter for preprocedural cardiovascular examination: Secondary | ICD-10-CM

## 2021-06-23 NOTE — Progress Notes (Signed)
Virtual Visit via Telephone Note   Because of Lawrence [name]'s co-morbid illnesses, he is at least at moderate risk for complications without adequate follow up.  This format is felt to be most appropriate for this patient at this time.  The patient did not have access to video technology/had technical difficulties with video requiring transitioning to audio format only (telephone).  All issues noted in this document were discussed and addressed.  No physical exam could be performed with this format.  Please refer to the patient's chart for his consent to telehealth for [facility].  Evaluation Performed:  Preoperative cardiovascular risk assessment _____________   Date:  06/23/2021   Patient ID:  Lawrence [name], DOB 11/01/1950, MRN [id number] Patient Location:  Home Provider location:   Office  Primary Care Provider:  Andi Devon, MD Primary Cardiologist:  [name], MD  Chief Complaint / Patient Profile   71 y.o. y/o male with a h/o NSTEMI 01/2012 s/p POBA of dLAD, hypertension, OSA, hyperlipidemia who is pending liposuction and presents today for telephonic preoperative cardiovascular risk assessment.  Past Medical History    Past Medical History:  Diagnosis Date   Anxiety    Arthritis    hips   CAD (coronary artery disease)    a. NSTEMI 1/14 - LHC: Ostial/proximal LAD 50%, distal LAD occluded near the apex with faint left to left collaterals, EF 50% with apical akinesis.;  b. Echo 1/14: Severe LVH, EF 50-55%, apical HK, mild LAE;  c. ETT-Myoview 3/14: EF 69%, apical scar, no ischemia;   d. ETT 12/15: no ST ischemic ST changes    Coronary artery disease involving native heart 11/29/2013   Occluded apical LAD. 50% proximal to mid LAD. Apical infarct January 2014.    GERD (gastroesophageal reflux disease)    OTC   History of MI (myocardial infarction)    HTN (hypertension) 01/23/2012   Hyperlipidemia 01/24/2012   LDL > 160 on admission    Hyperlipidemia LDL  goal < 70    Hypertension    Left ventricular aneurysm    NSTEMI. old. 01/23/2012   Obstructive sleep apnea 01/26/2012   Greater than 20 year history    Pneumonia    many years ago   Sleep apnea    on CPAP   Past Surgical History:  Procedure Laterality Date   CARDIAC CATHETERIZATION     CORONARY STENT PLACEMENT     HERNIA REPAIR Left    inguinal    LEFT HEART CATHETERIZATION WITH CORONARY ANGIOGRAM N/A 01/23/2012   Procedure: LEFT HEART CATHETERIZATION WITH CORONARY ANGIOGRAM;  Surgeon: [name], MD;  Location: [facility] CATH LAB;  Service: Cardiovascular;  Laterality: N/A;   TOTAL HIP ARTHROPLASTY Right 2018   TOTAL HIP ARTHROPLASTY Left 11/12/2020   Procedure: TOTAL HIP ARTHROPLASTY ANTERIOR APPROACH;  Surgeon: Ollen Gross, MD;  Location: [facility] ORS;  Service: Orthopedics;  Laterality: Left;    Allergies  No Known Allergies  History of Present Illness    Lawrence [name] is a 71 y.o. male who presents via audio/video conferencing for a telehealth visit today.  Pt was last seen in cardiology clinic on 10/15/20 by Tereso Newcomer, PA.  At that time Lawrence [name] was doing well. The patient is now pending procedure as outlined above. Since his last visit, he denies chest pain, shortness of breath, lower extremity edema, fatigue, palpitations, melena, hematuria, hemoptysis, diaphoresis, weakness, presyncope, syncope, orthopnea, and PND.  Home Medications    Prior  to Admission medications   Medication Sig Start Date End Date Taking? Authorizing Provider  ALPRAZolam (XANAX) 0.25 MG tablet Take 1 tablet (0.25 mg total) by mouth at bedtime as needed for anxiety. Patient not taking: Reported on 06/16/2021 03/05/15   Tereso Newcomer T, PA-C  atorvastatin (LIPITOR) 80 MG tablet Take 1 tablet (80 mg total) by mouth daily. Patient taking differently: Take 80 mg by mouth every evening. 11/11/20 11/11/21  Tereso Newcomer T, PA-C  co-enzyme Q-10 50 MG capsule Take 50 mg by mouth every evening.     [provider]  hydrochlorothiazide (MICROZIDE) 12.5 MG capsule TAKE 1 CAPSULE BY MOUTH EVERY DAY 01/19/21   Tereso Newcomer T, PA-C  HYDROcodone-acetaminophen (NORCO/VICODIN) 5-325 MG tablet Take 1-2 tablets by mouth every 6 (six) hours as needed for severe pain. Patient not taking: Reported on 06/16/2021 11/13/20   Nelia Shi D, PA-C  lisinopril (ZESTRIL) 20 MG tablet Take 1 tablet (20 mg total) by mouth daily. 02/24/21   Lyn Records, MD  methocarbamol (ROBAXIN) 500 MG tablet Take 1 tablet (500 mg total) by mouth every 6 (six) hours as needed for muscle spasms. Patient not taking: Reported on 06/16/2021 11/13/20   Nelia Shi D, PA-C  metoprolol succinate (TOPROL-XL) 50 MG 24 hr tablet TAKE 1 TABLET BY MOUTH EVERY DAY Patient not taking: Reported on 06/16/2021 12/10/20   Lyn Records, MD  nitroGLYCERIN (NITROSTAT) 0.4 MG SL tablet Place 1 tablet (0.4 mg total) under the tongue every 5 (five) minutes as needed for chest pain. 07/17/19   Lyn Records, MD  rivaroxaban (XARELTO) 10 MG TABS tablet Take 1 tablet (10 mg total) by mouth daily with breakfast. Then restart daily 81mg  Aspirin. Patient not taking: Reported on 06/16/2021 11/13/20   13/3/22 D, PA-C  traMADol (ULTRAM) 50 MG tablet Take 1-2 tablets (50-100 mg total) by mouth every 6 (six) hours as needed for moderate pain. Patient not taking: Reported on 06/16/2021 11/13/20   13/3/22, PA-C    Physical Exam    Vital Signs:  Lawrence [name] does not have vital signs available for review today.  Given telephonic nature of communication, physical exam is limited. AAOx3. NAD. Normal affect.  Speech and respirations are unlabored.  Accessory Clinical Findings    None  Assessment & Plan    1.  Preoperative Cardiovascular Risk Assessment: He is doing well from a cardiac perspective and may proceed without further testing.According to the Revised Cardiac Risk Index (RCRI), his Perioperative Risk of Major Cardiac Event is  (%): 0.4. His Functional Capacity in METs is: 6.61 according to the [facility] Activity Status Index (DASI).   A copy of this note will be routed to requesting surgeon.  Time:   Today, I have spent 10 minutes with the patient with telehealth technology discussing medical history, symptoms, and management plan.     [name], NP-C    06/23/2021, 4:05 PM [facility] [address] Office [phone] Fax [phone]

## 2021-06-26 ENCOUNTER — Telehealth: Payer: Self-pay | Admitting: Interventional Cardiology

## 2021-06-26 NOTE — Telephone Encounter (Signed)
Calling to f/u on Clearance that was sent over. Please advise

## 2021-06-26 NOTE — Telephone Encounter (Signed)
I returned a call to [place] with Suzzanne Cloud. I have informed her that the pt has been cleared and with notes faxed over on 06/23/21 to fax # [phone]. Lanora Manis states the fax # is [phone]. I will re-fax notes to [facility].

## 2021-06-26 NOTE — Telephone Encounter (Signed)
See clearance notes  

## 2021-06-29 NOTE — Telephone Encounter (Signed)
Lawrence [name] called the office and said she still has not yet received the clearance notes. I assured her that the office notes were faxed over 06/26/21 after our conversation. Faxed to [phone]. Lawrence [name] asked if we would be able to email it to her as well.   [email or web address]  I assured Lawrence [name] that I will re-fax as well as email the clearance notes.   Lawrence [name] thanked me for the help.

## 2021-09-13 ENCOUNTER — Other Ambulatory Visit: Payer: Self-pay | Admitting: Interventional Cardiology

## 2021-10-14 NOTE — Progress Notes (Deleted)
Cardiology Office Note:    Date:  10/14/2021   ID:  Lawrence [name], DOB 12-04-1950, MRN [id number]  PCP:  Willey Blade, MD  Cardiologist:  Sinclair Grooms, MD   Referring MD: Willey Blade, MD   No chief complaint on file.   History of Present Illness:    Lawrence [name] is a 71 y.o. male with a hx of NSTEMI 01/2012 s/p POBA of dLAD, hypertension, OSA with CPAP, hyperlipidemia, and bilateral hip osteoarthritis.   ***  Past Medical History:  Diagnosis Date   Anxiety    Arthritis    hips   CAD (coronary artery disease)    a. NSTEMI 1/14 - LHC: Ostial/proximal LAD 50%, distal LAD occluded near the apex with faint left to left collaterals, EF 50% with apical akinesis.;  b. Echo 1/14: Severe LVH, EF 50-55%, apical HK, mild LAE;  c. ETT-Myoview 3/14: EF 69%, apical scar, no ischemia;   d. ETT 12/15: no ST ischemic ST changes    Coronary artery disease involving native heart 11/29/2013   Occluded apical LAD. 50% proximal to mid LAD. Apical infarct January 2014.    GERD (gastroesophageal reflux disease)    OTC   History of MI (myocardial infarction)    HTN (hypertension) 01/23/2012   Hyperlipidemia 01/24/2012   LDL > 160 on admission    Hyperlipidemia LDL goal < 70    Hypertension    Left ventricular aneurysm    NSTEMI. old. 01/23/2012   Obstructive sleep apnea 01/26/2012   Greater than 20 year history    Pneumonia    many years ago   Sleep apnea    on CPAP    Past Surgical History:  Procedure Laterality Date   CARDIAC CATHETERIZATION     CORONARY STENT PLACEMENT     HERNIA REPAIR Left    inguinal    LEFT HEART CATHETERIZATION WITH CORONARY ANGIOGRAM N/A 01/23/2012   Procedure: LEFT HEART CATHETERIZATION WITH CORONARY ANGIOGRAM;  Surgeon: Sinclair Grooms, MD;  Location: [facility] CATH LAB;  Service: Cardiovascular;  Laterality: N/A;   TOTAL HIP ARTHROPLASTY Right 2018   TOTAL HIP ARTHROPLASTY Left 11/12/2020   Procedure: TOTAL HIP ARTHROPLASTY ANTERIOR APPROACH;   Surgeon: Gaynelle Arabian, MD;  Location: [facility] ORS;  Service: Orthopedics;  Laterality: Left;    Current Medications: No outpatient medications have been marked as taking for the 10/15/21 encounter (Appointment) with Belva Crome, MD.     Allergies:   Patient has no known allergies.   Social History   Socioeconomic History   Marital status: Married    Spouse name: Not on file   Number of children: Not on file   Years of education: Not on file   Highest education level: Not on file  Occupational History   Occupation: Brewing technologist  Tobacco Use   Smoking status: Former    Types: Cigars    Quit date: 01/12/2012    Years since quitting: 9.7   Smokeless tobacco: Never  Vaping Use   Vaping Use: Never used  Substance and Sexual Activity   Alcohol use: No   Drug use: No   Sexual activity: Not Currently  Other Topics Concern   Not on file  Social History Narrative   Not on file   Social Determinants of Health   Financial Resource Strain: Not on file  Food Insecurity: Not on file  Transportation Needs: Not on file  Physical Activity: Not on file  Stress: Not on file  Social Connections: Not on file     Family History: The patient's family history includes Hypertension in his mother. There is no history of Fainting, Heart attack, Heart disease, Heart failure, Hyperlipidemia, Anemia, Asthma, or Clotting disorder.  ROS:   Please see the history of present illness.    *** All other systems reviewed and are negative.  EKGs/Labs/Other Studies Reviewed:    The following studies were reviewed today: Myocardial perfusion imaging 2020: Study Highlights    Nuclear stress EF: 60%. There was no ST segment deviation noted during stress. The study is normal. This is a low risk study. The left ventricular ejection fraction is normal (55-65%).   Normal pharmacologic nuclear stress test with no evidence for prior infarct or ischemia.  EKG:  EKG ***  Recent  Labs: 10/31/2020: ALT 27 11/13/2020: BUN 13; Creatinine, Ser 0.96; Hemoglobin 13.5; Platelets 203; Potassium 4.1; Sodium 136  Recent Lipid Panel    Component Value Date/Time   CHOL 189 06/25/2016 1113   TRIG 191 (H) 06/25/2016 1113   HDL 49 06/25/2016 1113   CHOLHDL 3.9 06/25/2016 1113   CHOLHDL 4 12/20/2013 0840   VLDL 22.6 12/20/2013 0840   LDLCALC 102 (H) 06/25/2016 1113    Physical Exam:    VS:  There were no vitals taken for this visit.    Wt Readings from Last 3 Encounters:  11/12/20 217 lb (98.4 kg)  10/31/20 217 lb (98.4 kg)  10/15/20 219 lb 3.2 oz (99.4 kg)     GEN: ***. No acute distress HEENT: Normal NECK: No JVD. LYMPHATICS: No lymphadenopathy CARDIAC: *** murmur. RRR *** gallop, or edema. VASCULAR: *** Normal Pulses. No bruits. RESPIRATORY:  Clear to auscultation without rales, wheezing or rhonchi  ABDOMEN: Soft, non-tender, non-distended, No pulsatile mass, MUSCULOSKELETAL: No deformity  SKIN: Warm and dry NEUROLOGIC:  Alert and oriented x 3 PSYCHIATRIC:  Normal affect   ASSESSMENT:    1. Coronary artery disease involving native coronary artery of native heart without angina pectoris   2. Mixed hyperlipidemia   3. Essential hypertension    PLAN:    In order of problems listed above:  ***   Medication Adjustments/Labs and Tests Ordered: Current medicines are reviewed at length with the patient today.  Concerns regarding medicines are outlined above.  No orders of the defined types were placed in this encounter.  No orders of the defined types were placed in this encounter.   There are no Patient Instructions on file for this visit.   Signed, Sinclair Grooms, MD  10/14/2021 8:08 AM    [place]

## 2021-10-15 ENCOUNTER — Ambulatory Visit: Payer: Medicare Other | Attending: Interventional Cardiology | Admitting: Interventional Cardiology

## 2021-10-15 DIAGNOSIS — I2510 Atherosclerotic heart disease of native coronary artery without angina pectoris: Secondary | ICD-10-CM

## 2021-10-15 DIAGNOSIS — I10 Essential (primary) hypertension: Secondary | ICD-10-CM

## 2021-10-15 DIAGNOSIS — E782 Mixed hyperlipidemia: Secondary | ICD-10-CM

## 2021-10-24 ENCOUNTER — Other Ambulatory Visit: Payer: Self-pay | Admitting: Physician Assistant

## 2021-11-19 ENCOUNTER — Other Ambulatory Visit: Payer: Self-pay | Admitting: Physician Assistant

## 2021-12-13 ENCOUNTER — Other Ambulatory Visit: Payer: Self-pay | Admitting: Interventional Cardiology

## 2021-12-18 IMAGING — DX DG PORTABLE PELVIS
1 series · 1 of 1 positions shown · non-contrast
Comparison: Intraoperative imaging earlier today.

CLINICAL DATA: Status post left hip arthroplasty.

EXAM:
PORTABLE PELVIS 1-2 VIEWS

[pelvis ap]
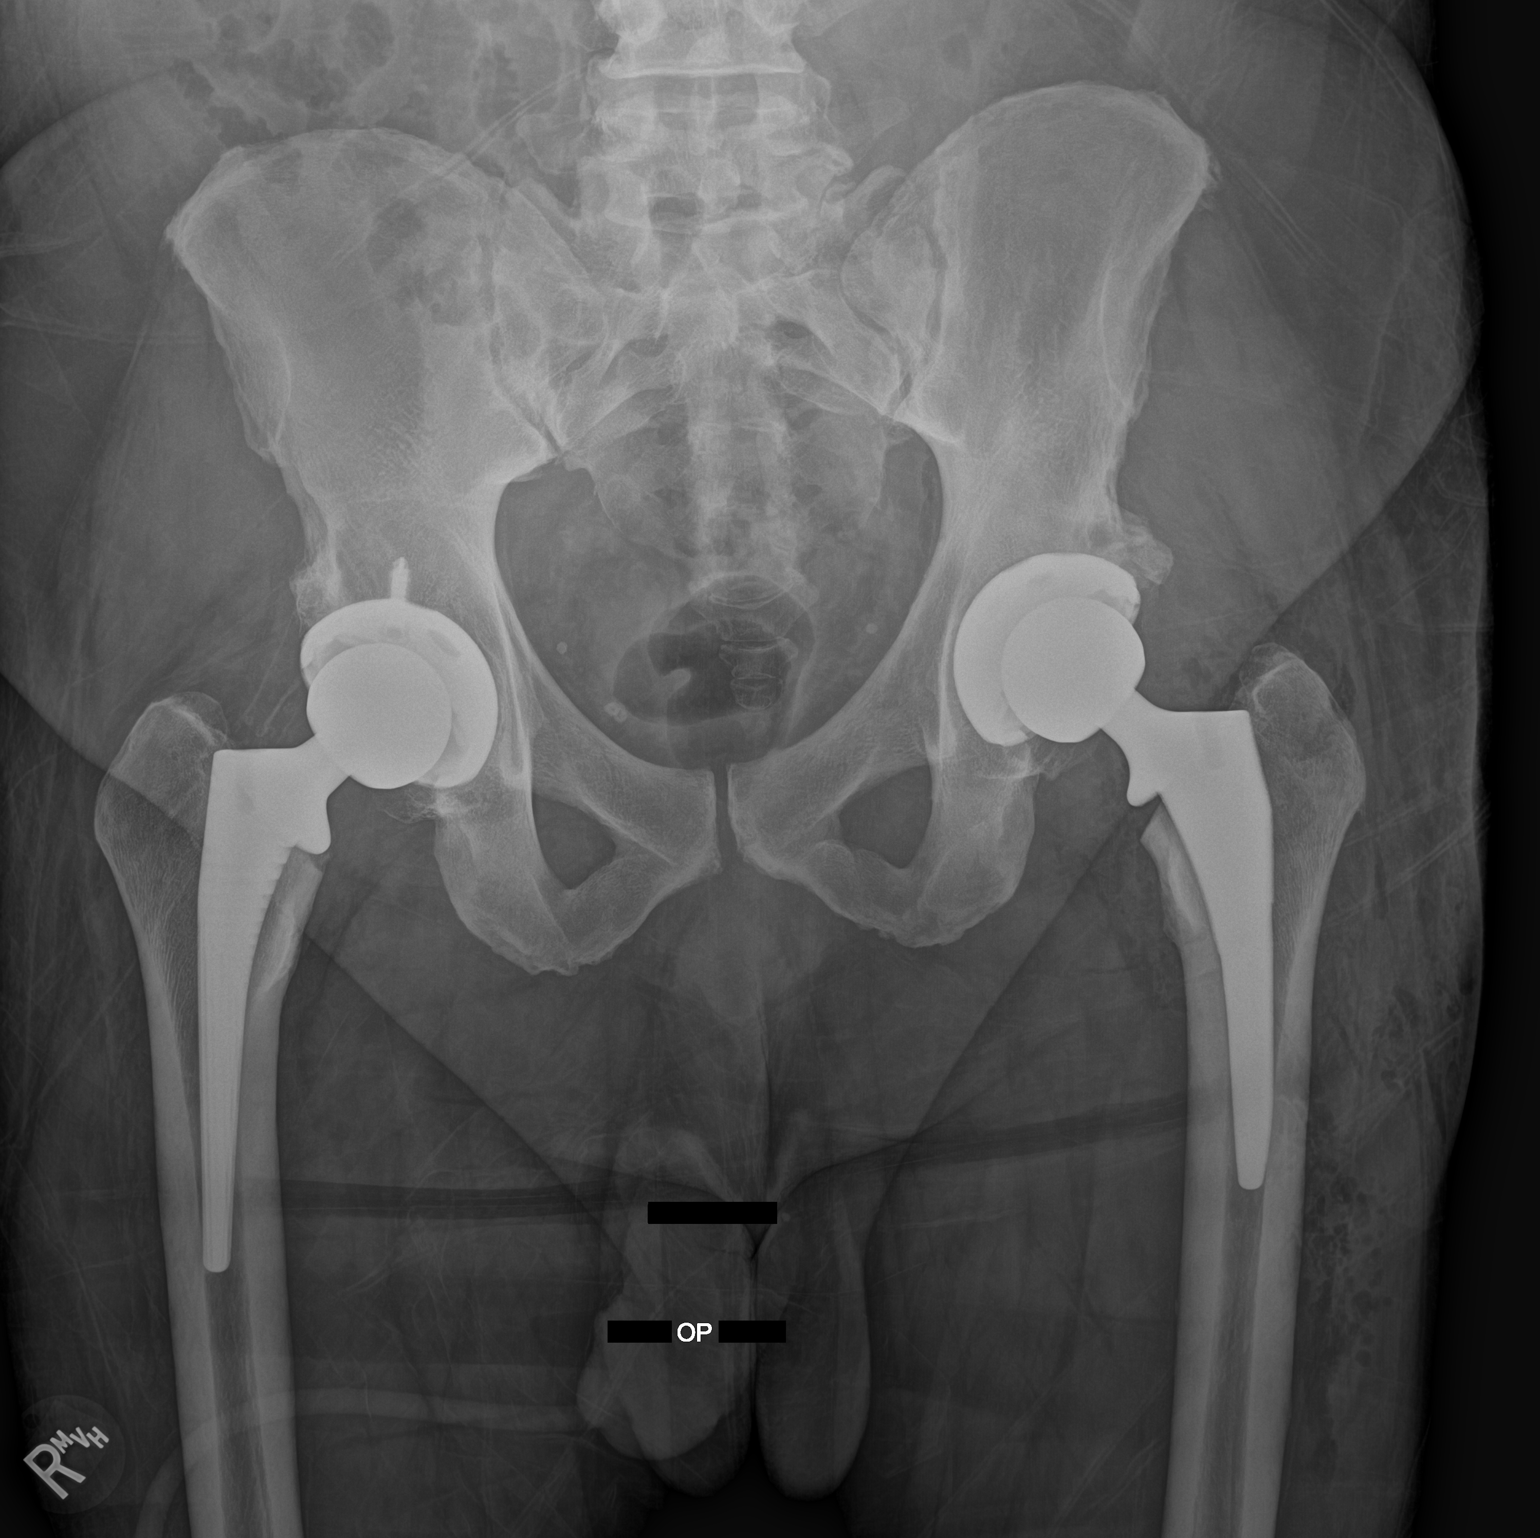

[1 of 1 positions shown; findings below may reference images not displayed]

FINDINGS: From projection of the pelvis and proximal femurs demonstrate normal
alignment in the frontal projection after left hip arthroplasty. A
right hip arthroplasty is also present. The bony pelvis is intact.
IMPRESSION: Normal alignment following left hip arthroplasty.

## 2021-12-18 IMAGING — RF DG HIP (WITH PELVIS) OPERATIVE*L*
1 series · 6 of 6 positions shown · non-contrast
Comparison: None.

FLUOROSCOPY TIME:  11 seconds

CLINICAL DATA: Left hip total arthroplasty.

EXAM:
OPERATIVE LEFT HIP (WITH PELVIS IF PERFORMED)
TECHNIQUE: Fluoroscopic spot image(s) were submitted for interpretation
post-operatively.

[Series 1: unknown protocol · 0.20mm/px · 6 of 6 slices shown]
[im 1/6]
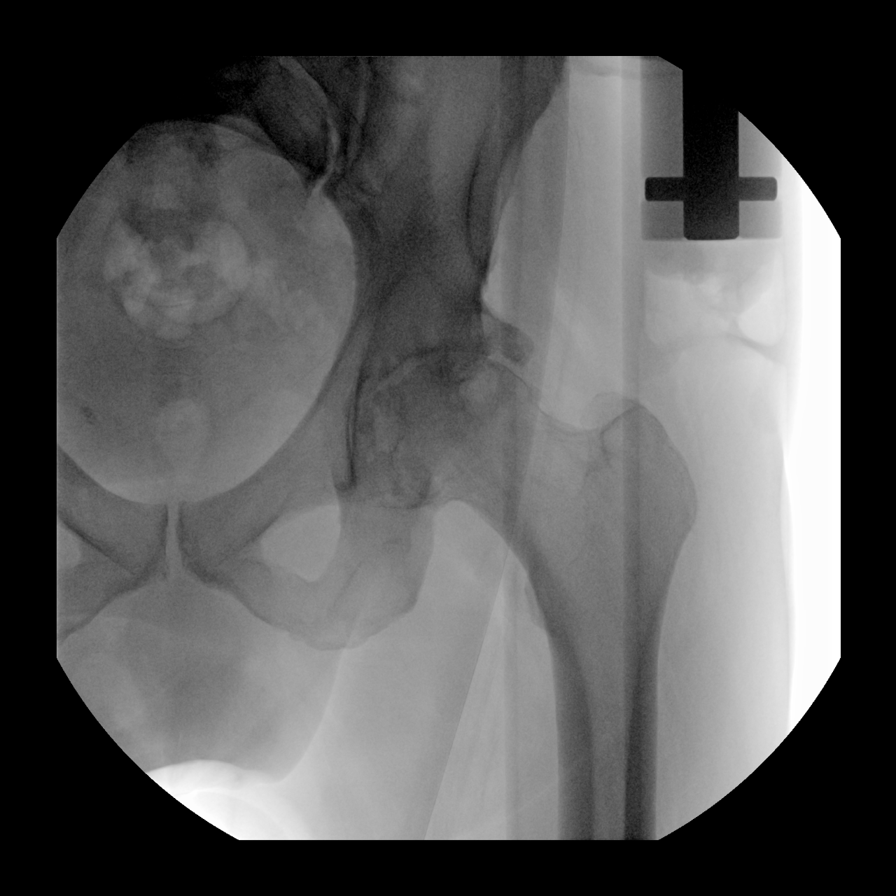
[im 2/6]
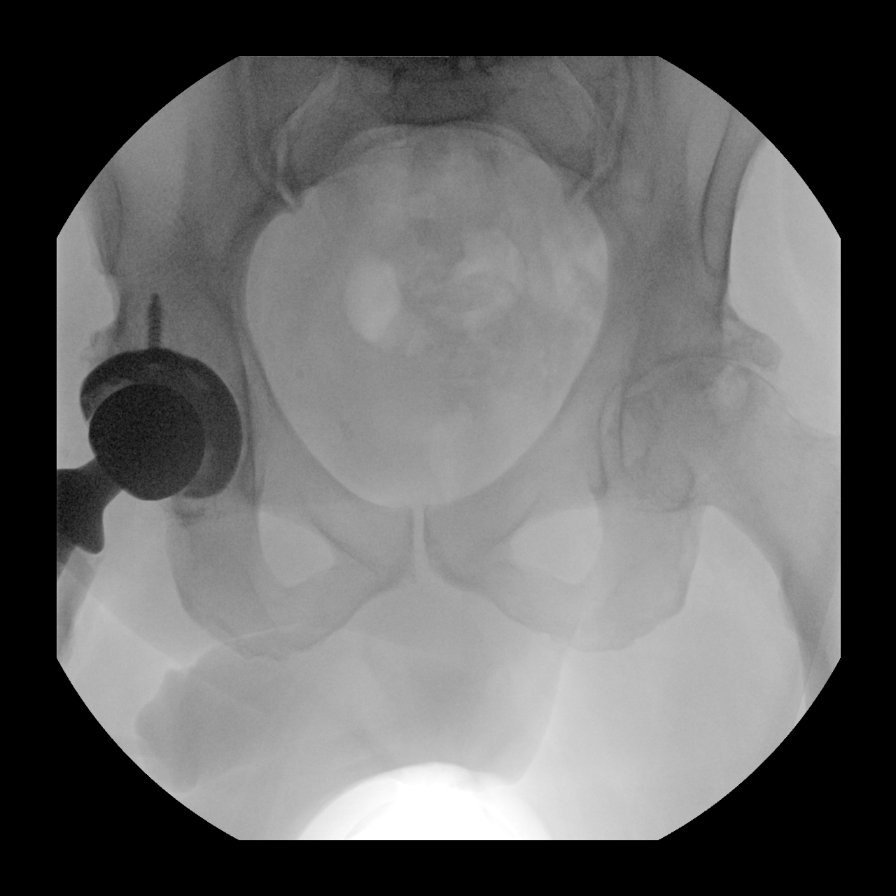
[im 3/6]
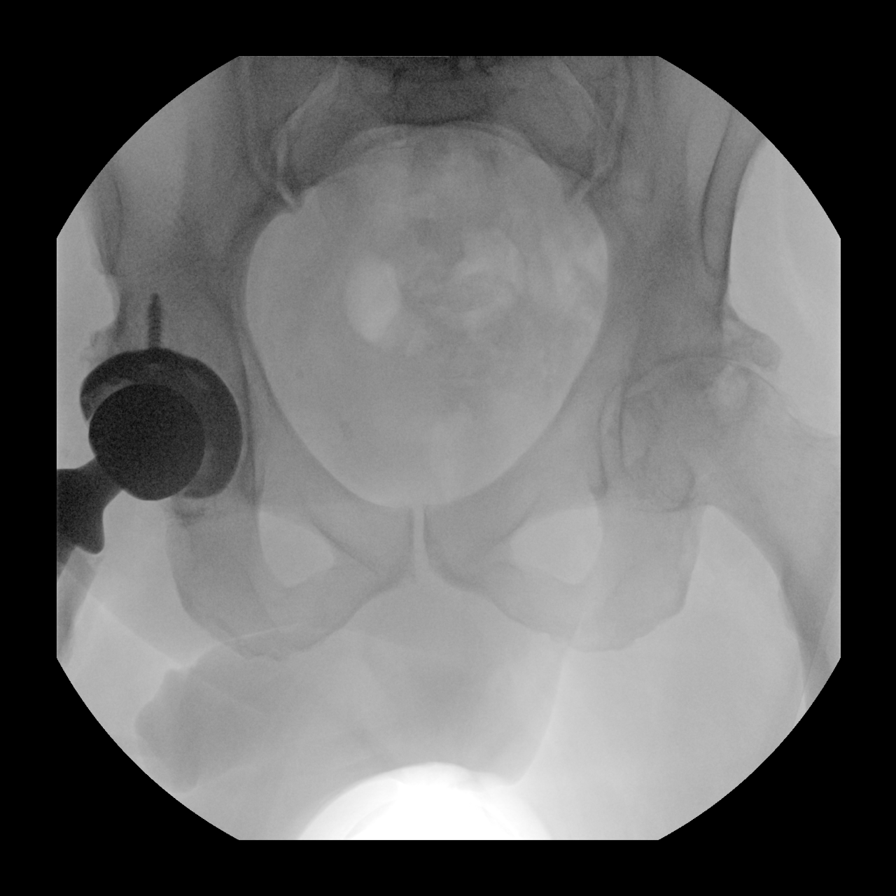
[im 4/6]
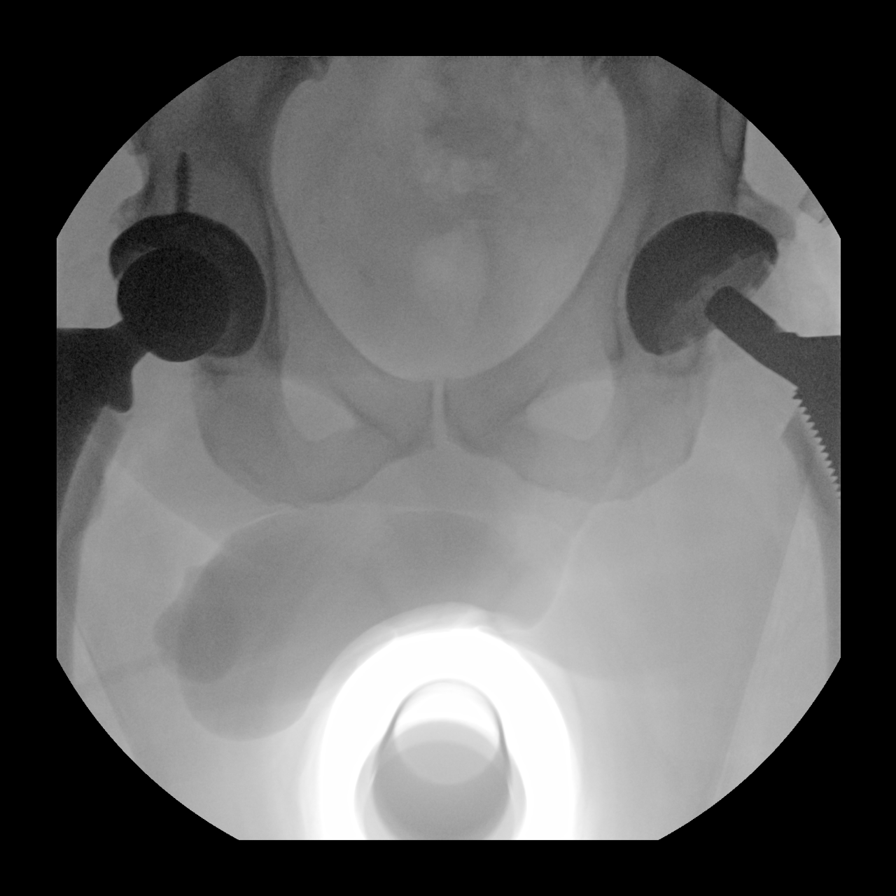
[im 5/6]
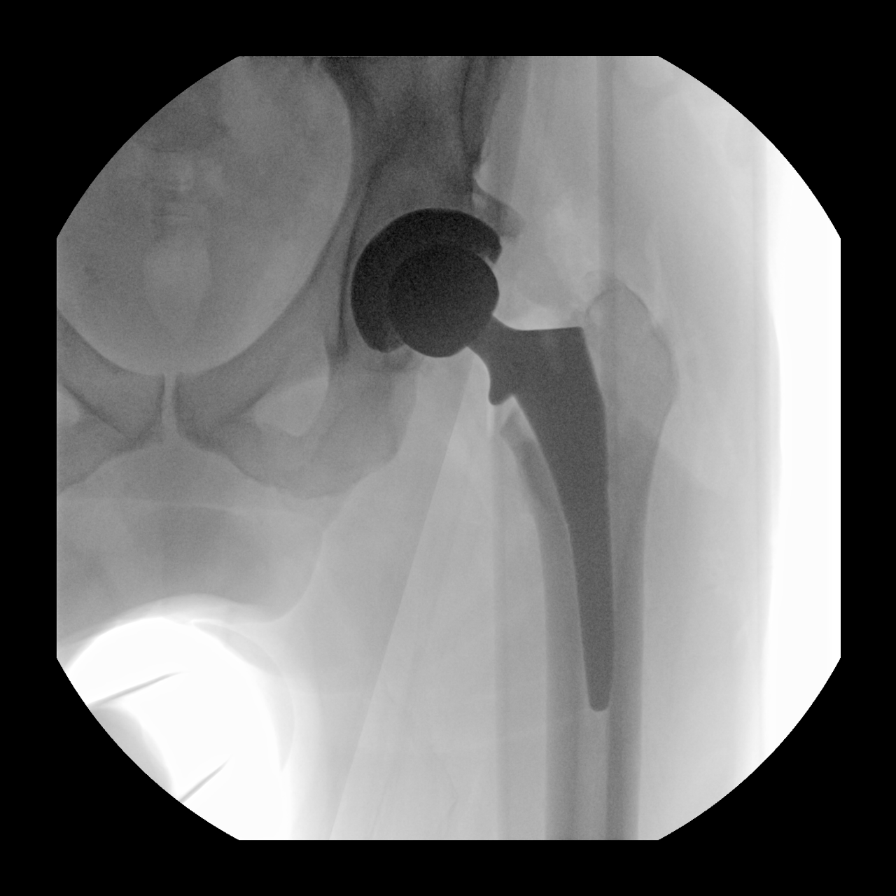
[im 6/6]
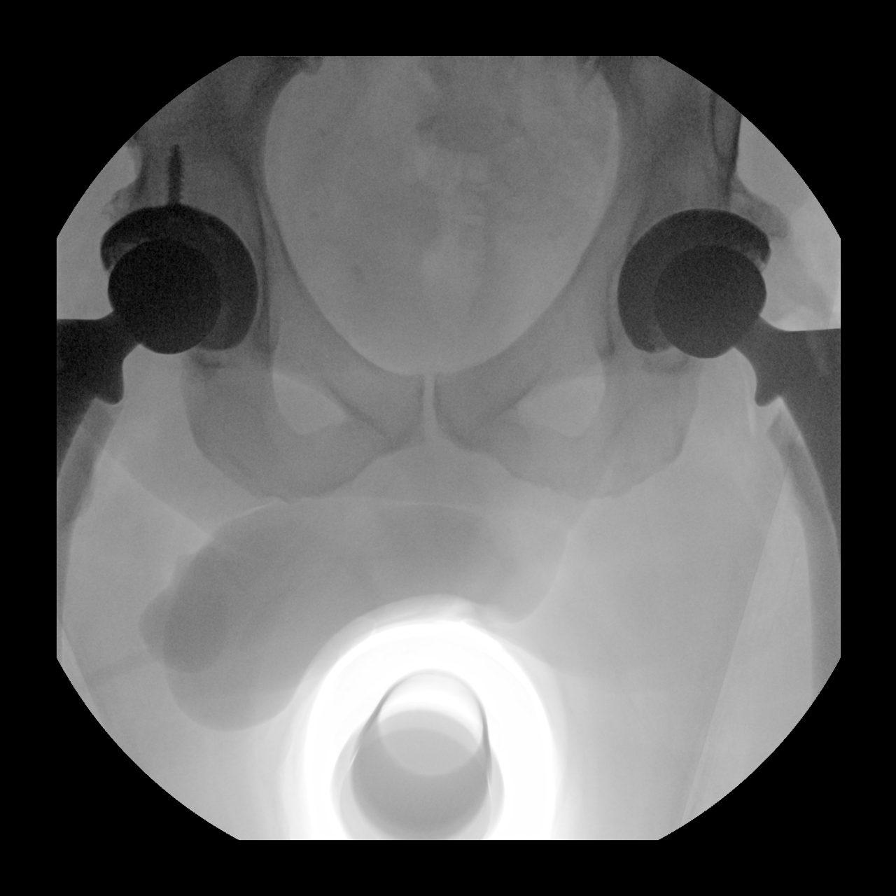

[6 of 6 positions shown; findings below may reference images not displayed]

FINDINGS: Intraoperative fluoroscopic images demonstrate left hip total
arthroplasty. No obvious perihardware fracture or component
malpositioning. Pre-existing right hip arthroplasty.
IMPRESSION: Intraoperative fluoroscopic images demonstrate left hip total
arthroplasty. No obvious perihardware fracture or component
malpositioning.

## 2021-12-28 ENCOUNTER — Other Ambulatory Visit: Payer: Self-pay | Admitting: Interventional Cardiology

## 2022-01-02 ENCOUNTER — Other Ambulatory Visit: Payer: Self-pay | Admitting: Interventional Cardiology

## 2022-01-18 ENCOUNTER — Other Ambulatory Visit: Payer: Self-pay | Admitting: Internal Medicine

## 2022-01-18 ENCOUNTER — Ambulatory Visit
Admission: RE | Admit: 2022-01-18 | Discharge: 2022-01-18 | Disposition: A | Discharge status: Home / Self Care | Payer: Medicare Other | Source: Ambulatory Visit | Attending: Internal Medicine | Admitting: Internal Medicine

## 2022-01-18 DIAGNOSIS — R059 Cough, unspecified: Secondary | ICD-10-CM

## 2022-02-02 ENCOUNTER — Other Ambulatory Visit: Payer: Self-pay | Admitting: Interventional Cardiology

## 2022-02-23 ENCOUNTER — Telehealth: Payer: Self-pay | Admitting: *Deleted

## 2022-02-23 NOTE — Telephone Encounter (Signed)
   Pre-operative Risk Assessment    Patient Name: ISAC LINCKS  DOB: Sep 16, 1950 MRN: [id number]      Request for Surgical Clearance    Procedure:   LEFT RING FINGER/PALM DUPUYTREN'S FASCIECTOMY  Date of Surgery:  Clearance 03/12/22                                 Surgeon:  DR. Orene Desanctis Surgeon's Group or Practice Name:  [facility] Phone number:  [phone]; [facility] Fax number:  [phone]   Type of Clearance Requested:   - Medical ; ASA INSTRUCTION BEING REQUESTED, THOUGH I DO NOT SEE ASA ON MED LIST  5. What type of anesthesia will be used?  Press F2 and select the anesthesia to be used for the procedure.  :1}  Type of Anesthesia:   CHOICE   Additional requests/questions:    Jiles Prows   02/23/2022, 11:13 AM

## 2022-02-23 NOTE — Telephone Encounter (Signed)
Left message to call back to schedule an in office appt for pre op clearance. Pt can see APP.

## 2022-02-23 NOTE — Telephone Encounter (Signed)
   Name: Lawrence [name]  DOB: 10/10/1950  MRN: [id number]  Primary Cardiologist: Sinclair Grooms, MD (Inactive)  Chart reviewed as part of pre-operative protocol coverage. Because of Lawrence [name]'s past medical history and time since last visit, he will require a follow-up in-office visit in order to better assess preoperative cardiovascular risk.  Pre-op covering staff: - Please schedule appointment and call patient to inform them. If patient already had an upcoming appointment within acceptable timeframe, please add "pre-op clearance" to the appointment notes so provider is aware. - Please contact requesting surgeon's office via preferred method (i.e, phone, fax) to inform them of need for appointment prior to surgery.  Request stated aspirin to be held however not on patient's med list.  Please clarify at follow-up visit.  Mable Fill, Marissa Nestle, NP  02/23/2022, 11:22 AM

## 2022-02-24 NOTE — Telephone Encounter (Signed)
Left message for the patient to contact office.

## 2022-03-01 NOTE — Telephone Encounter (Signed)
Left message to call back for in office appt. Our office has tried x 3 to reach the pt for in office appt. I will send FYI to requesting office pt needs in office appt. Will remove from the pre op call back pool at this time.

## 2022-06-08 ENCOUNTER — Encounter (HOSPITAL_BASED_OUTPATIENT_CLINIC_OR_DEPARTMENT_OTHER): Payer: Self-pay | Admitting: Orthopedic Surgery

## 2022-06-08 ENCOUNTER — Other Ambulatory Visit: Payer: Self-pay

## 2022-06-08 NOTE — Progress Notes (Signed)
Chart reviewed with Dr. Mal Amabile, anesthesiologist, including cardiac hx. Okay to proceed with sx at [facility] pending any significant change to pt health.

## 2022-06-15 ENCOUNTER — Ambulatory Visit (HOSPITAL_BASED_OUTPATIENT_CLINIC_OR_DEPARTMENT_OTHER): Admit: 2022-06-15 | Payer: Medicare Other | Admitting: Orthopedic Surgery

## 2022-06-15 DIAGNOSIS — Z01818 Encounter for other preprocedural examination: Secondary | ICD-10-CM

## 2022-06-15 DIAGNOSIS — I10 Essential (primary) hypertension: Secondary | ICD-10-CM

## 2022-06-15 DIAGNOSIS — Z79899 Other long term (current) drug therapy: Secondary | ICD-10-CM

## 2022-06-15 SURGERY — RELEASE, CUBITAL TUNNEL
Anesthesia: Regional | Laterality: Left

## 2022-08-22 ENCOUNTER — Emergency Department (HOSPITAL_COMMUNITY): Payer: Medicare Other

## 2022-08-22 ENCOUNTER — Emergency Department (HOSPITAL_COMMUNITY)
Admission: EM | Admit: 2022-08-22 | Discharge: 2022-08-22 | Disposition: A | Discharge status: Home / Self Care | Payer: Medicare Other | Attending: Emergency Medicine | Admitting: Emergency Medicine

## 2022-08-22 DIAGNOSIS — W19XXXA Unspecified fall, initial encounter: Secondary | ICD-10-CM

## 2022-08-22 DIAGNOSIS — Z23 Encounter for immunization: Secondary | ICD-10-CM | POA: Diagnosis not present

## 2022-08-22 DIAGNOSIS — S0990XA Unspecified injury of head, initial encounter: Secondary | ICD-10-CM

## 2022-08-22 DIAGNOSIS — S0101XA Laceration without foreign body of scalp, initial encounter: Secondary | ICD-10-CM | POA: Insufficient documentation

## 2022-08-22 DIAGNOSIS — W01198A Fall on same level from slipping, tripping and stumbling with subsequent striking against other object, initial encounter: Secondary | ICD-10-CM | POA: Insufficient documentation

## 2022-08-22 DIAGNOSIS — Z79899 Other long term (current) drug therapy: Secondary | ICD-10-CM | POA: Insufficient documentation

## 2022-08-22 DIAGNOSIS — I10 Essential (primary) hypertension: Secondary | ICD-10-CM | POA: Insufficient documentation

## 2022-08-22 MED ORDER — LIDOCAINE-EPINEPHRINE-TETRACAINE (LET) TOPICAL GEL
3.0000 mL | Freq: Once | TOPICAL | Status: AC
  Administered 2022-08-22: 3 mL via TOPICAL
  Filled 2022-08-22: qty 3

## 2022-08-22 MED ORDER — TETANUS-DIPHTH-ACELL PERTUSSIS 5-2.5-18.5 LF-MCG/0.5 IM SUSY
0.5000 mL | PREFILLED_SYRINGE | Freq: Once | INTRAMUSCULAR | Status: AC
  Administered 2022-08-22: 0.5 mL via INTRAMUSCULAR
  Filled 2022-08-22: qty 0.5

## 2022-08-22 NOTE — ED Provider Notes (Signed)
[place] EMERGENCY DEPARTMENT AT [facility] Provider Note   CSN: [id number] Arrival date & time: 08/22/22  0022     History  Chief Complaint  Patient presents with   Lawrence [name] is a 72 y.o. male.  72 year old male with history of hypertension who presents ER today after mechanical fall.  Patient try to put on his pants when he did he slipped and fell hitting the back of his head on a wine cooler.  No loss of consciousness.  Acting normally since then.  No change in neurologic function.  Has mild pain in the area.  No blood thinners but does take a baby aspirin daily.  No Plavix.  Has been acting normally since then and no emesis.   Fall       Home Medications Prior to Admission medications   Medication Sig Start Date End Date Taking? Authorizing Provider  atorvastatin (LIPITOR) 80 MG tablet TAKE 1 TABLET BY MOUTH EVERY DAY 11/20/21   Swinyer, Zachary George, NP  co-enzyme Q-10 50 MG capsule Take 50 mg by mouth every evening.    [provider]  hydrochlorothiazide (MICROZIDE) 12.5 MG capsule TAKE 1 CAPSULE BY MOUTH EVERY DAY 10/26/21   Lyn Records, MD  lisinopril (ZESTRIL) 20 MG tablet TAKE 1 TABLET DAILY. PLEASE CALL TO SCHEDULE AN OVERDUE APPOINTMENT 02/03/22   Swinyer, Zachary George, NP  metoprolol succinate (TOPROL-XL) 50 MG 24 hr tablet TAKE 1 TABLET BY MOUTH EVERY DAY 01/05/22   Lyn Records, MD  nitroGLYCERIN (NITROSTAT) 0.4 MG SL tablet PLACE 1 TABLET (0.4 MG TOTAL) UNDER THE TONGUE EVERY 5 (FIVE) MINUTES AS NEEDED FOR CHEST PAIN. 09/15/21   Lyn Records, MD      Allergies    Patient has no known allergies.    Review of Systems   Review of Systems  Physical Exam Updated Vital Signs BP 104/64 (BP Location: Right Arm)   Pulse 91   Temp 98.6 F (37 C) (Oral)   Resp (!) 23   SpO2 93%  Physical Exam Vitals and nursing note reviewed.  Constitutional:      Appearance: He is well-developed.  HENT:     Head: Normocephalic.      Comments: Hemostatic, straight, well-approximated 3.5 cm laceration just above the occiput without underlying deformity. Cardiovascular:     Rate and Rhythm: Normal rate.  Pulmonary:     Effort: Pulmonary effort is normal. No respiratory distress.  Abdominal:     General: There is no distension.  Musculoskeletal:        General: Normal range of motion.     Cervical back: Normal range of motion.  Neurological:     Mental Status: He is alert.     ED Results / Procedures / Treatments   Labs (all labs ordered are listed, but only abnormal results are displayed) Labs Reviewed - No data to display  EKG None  Radiology CT Head Wo Contrast  Result Date: 08/22/2022 CLINICAL DATA:  Neck trauma (Age >= 65y); Head trauma, minor (Age >= 65y) EXAM: CT HEAD WITHOUT CONTRAST CT CERVICAL SPINE WITHOUT CONTRAST TECHNIQUE: Multidetector CT imaging of the head and cervical spine was performed following the standard protocol without intravenous contrast. Multiplanar CT image reconstructions of the cervical spine were also generated. RADIATION DOSE REDUCTION: This exam was performed according to the departmental dose-optimization program which includes automated exposure control, adjustment of the mA and/or kV according to patient size and/or use of  iterative reconstruction technique. COMPARISON:  CT 07/12/2015 FINDINGS: CT HEAD FINDINGS Brain: No evidence of large-territorial acute infarction. No parenchymal hemorrhage. No mass lesion. No extra-axial collection. No mass effect or midline shift. No hydrocephalus. Basilar cisterns are patent. Vascular: No hyperdense vessel. Atherosclerotic calcifications are present within the cavernous internal carotid arteries. Skull: No acute fracture or focal lesion. Sinuses/Orbits: Paranasal sinuses and mastoid air cells are clear. The orbits are unremarkable. Other: None. CT CERVICAL SPINE FINDINGS Alignment: Normal. Skull base and vertebrae: Multilevel mild degenerative  changes of the spine. No associated severe osseous neural foraminal or central canal stenosis. No acute fracture. No aggressive appearing focal osseous lesion or focal pathologic process. Soft tissues and spinal canal: No prevertebral fluid or swelling. No visible canal hematoma. Upper chest: Unremarkable. Other: None. IMPRESSION: 1. No acute intracranial abnormality. 2. No acute displaced fracture or traumatic listhesis of the cervical spine. Electronically Signed   By: Tish Frederickson M.D.   On: 08/22/2022 01:31   CT Cervical Spine Wo Contrast  Result Date: 08/22/2022 CLINICAL DATA:  Neck trauma (Age >= 65y); Head trauma, minor (Age >= 65y) EXAM: CT HEAD WITHOUT CONTRAST CT CERVICAL SPINE WITHOUT CONTRAST TECHNIQUE: Multidetector CT imaging of the head and cervical spine was performed following the standard protocol without intravenous contrast. Multiplanar CT image reconstructions of the cervical spine were also generated. RADIATION DOSE REDUCTION: This exam was performed according to the departmental dose-optimization program which includes automated exposure control, adjustment of the mA and/or kV according to patient size and/or use of iterative reconstruction technique. COMPARISON:  CT 07/12/2015 FINDINGS: CT HEAD FINDINGS Brain: No evidence of large-territorial acute infarction. No parenchymal hemorrhage. No mass lesion. No extra-axial collection. No mass effect or midline shift. No hydrocephalus. Basilar cisterns are patent. Vascular: No hyperdense vessel. Atherosclerotic calcifications are present within the cavernous internal carotid arteries. Skull: No acute fracture or focal lesion. Sinuses/Orbits: Paranasal sinuses and mastoid air cells are clear. The orbits are unremarkable. Other: None. CT CERVICAL SPINE FINDINGS Alignment: Normal. Skull base and vertebrae: Multilevel mild degenerative changes of the spine. No associated severe osseous neural foraminal or central canal stenosis. No acute  fracture. No aggressive appearing focal osseous lesion or focal pathologic process. Soft tissues and spinal canal: No prevertebral fluid or swelling. No visible canal hematoma. Upper chest: Unremarkable. Other: None. IMPRESSION: 1. No acute intracranial abnormality. 2. No acute displaced fracture or traumatic listhesis of the cervical spine. Electronically Signed   By: Tish Frederickson M.D.   On: 08/22/2022 01:31    Procedures .Marland KitchenLaceration Repair  Date/Time: 08/22/2022 2:39 AM  Performed by: Marily Memos, MD Authorized by: Marily Memos, MD   Consent:    Consent obtained:  Verbal   Consent given by:  Patient and spouse   Risks, benefits, and alternatives were discussed: yes     Risks discussed:  Infection, pain, poor cosmetic result, poor wound healing, nerve damage and need for additional repair   Alternatives discussed:  No treatment Universal protocol:    Procedure explained and questions answered to patient or proxy's satisfaction: yes     Relevant documents present and verified: yes     Test results available: yes     Imaging studies available: yes     Patient identity confirmed:  Verbally with patient and arm band Anesthesia:    Anesthesia method:  Local infiltration   Local anesthetic:  Lidocaine 1% WITH epi Laceration details:    Location:  Scalp   Scalp location:  Crown   Length (  cm):  3.5   Depth (mm):  4 Pre-procedure details:    Preparation:  Patient was prepped and draped in usual sterile fashion and imaging obtained to evaluate for foreign bodies Exploration:    Limited defect created (wound extended): no     Imaging outcome: foreign body not noted     Wound exploration: wound explored through full range of motion     Contaminated: no   Treatment:    Area cleansed with:  Saline and chlorhexidine   Amount of cleaning:  Standard   Irrigation solution:  Sterile water   Irrigation volume:  75   Irrigation method:  Syringe   Visualized foreign bodies/material  removed: no     Debridement:  None Skin repair:    Repair method:  Staples   Number of staples:  5 Approximation:    Approximation:  Close Repair type:    Repair type:  Simple Post-procedure details:    Dressing:  Antibiotic ointment     Medications Ordered in ED Medications  lidocaine-EPINEPHrine-tetracaine (LET) topical gel (3 mLs Topical Given 08/22/22 0059)  Tdap (BOOSTRIX) injection 0.5 mL (0.5 mLs Intramuscular Given 08/22/22 0059)    ED Course/ Medical Decision Making/ A&P                                 Medical Decision Making Amount and/or Complexity of Data Reviewed Radiology: ordered.  Risk Prescription drug management.   Wound repaired as above.  CT scan of head and neck done interpreted by myself without obvious fractures or bleed.  Baseline mental status.  Concussion and delayed bleed precautions given to wife.  Will return to PCP or urgent care in 10 to 14 days to get staples removed.  If not will come back here.  Final Clinical Impression(s) / ED Diagnoses Final diagnoses:  Fall, initial encounter  Laceration of scalp, initial encounter  Injury of head, initial encounter    Rx / DC Orders ED Discharge Orders     None         Lielle Vandervort, Barbara Cower, MD 08/22/22 [phone]

## 2022-08-22 NOTE — ED Triage Notes (Signed)
Pt BIB EMS. Fall on thinners. Trying to get out of a chair and tripped over himself, striking head on the corner of a refrigerator. No LOC or complaints of dizziness prior to fall. Small superficial laceration to the right occiput. Bleeding controlled

## 2022-08-24 NOTE — Progress Notes (Deleted)
  Cardiology Office Note:  .    Date:  08/24/2022  ID:  Lawrence [name], DOB 12/31/1950, MRN [id number] PCP: Andi Devon, MD  [facility] Providers Cardiologist:  None { Click to update primary MD,subspecialty MD or APP then REFRESH:1}    CC: Transition to new cardiologist, s/p fall   History of Present Illness: .    Lawrence [name] is a 72 y.o. male with a history of CAD (unable to POBA dLAD), HLD, HTN, and OSA.  Patient notes that he is doing ***.   Since last visit notes *** . There are no*** interval hospital/ED visit.   EKG showed ***  No chest pain or pressure ***.  No SOB/DOE*** and no PND/Orthopnea***.  No weight gain or leg swelling***.  No palpitations or syncope ***.  Ambulatory blood pressure ***.   Relevant histories: .  [facility] patient (2018) ROS: As per HPI.   Studies Reviewed: .   Cardiac Studies & Procedures     STRESS TESTS  MYOCARDIAL PERFUSION IMAGING 10/18/2018  Narrative  Nuclear stress EF: 60%.  There was no ST segment deviation noted during stress.  The study is normal.  This is a low risk study.  The left ventricular ejection fraction is normal (55-65%).  Normal pharmacologic nuclear stress test with no evidence for prior infarct or ischemia.              *** Risk Assessment/Calculations:    {Does this patient have ATRIAL FIBRILLATION?:[id number]}       Physical Exam:    VS:  There were no vitals taken for this visit.   Wt Readings from Last 3 Encounters:  11/12/20 217 lb (98.4 kg)  10/31/20 217 lb (98.4 kg)  10/15/20 219 lb 3.2 oz (99.4 kg)    Gen: *** distress, *** obese/well nourished/malnourished   Neck: No JVD, *** carotid bruit Ears: *** Frank Sign Cardiac: No Rubs or Gallops, *** Murmur, ***cardia, *** radial pulses Respiratory: Clear to auscultation bilaterally, *** effort, ***  respiratory rate GI: Soft, nontender, non-distended *** MS: No *** edema; *** moves all  extremities Integument: Skin feels *** Neuro:  At time of evaluation, alert and oriented to person/place/time/situation *** Psych: Normal affect, patient feels ***   ASSESSMENT AND PLAN: .    Coronary Artery Disease; Obstructive HLD - symptomatic on *** with stable/unstable angina on therapy *** - anatomy: dLAD  with failed 2014 intervention - continue ASA 81 mg; Continue *** until *** - continue statin, goal LDL < 55 - sending Lp(a) *** - continue BB - continue nitrates; *** PDEi - continue ACEi - discussed cardiac rehab  HTN and OSA     Riley Lam, MD FASE [facility] Cardiologist [facility]  [address] [place], [place] [address] [phone]  6:08 PM

## 2022-08-26 ENCOUNTER — Encounter: Payer: Self-pay | Admitting: Internal Medicine

## 2022-08-26 ENCOUNTER — Ambulatory Visit: Payer: Medicare Other | Attending: Internal Medicine | Admitting: Internal Medicine

## 2022-08-30 ENCOUNTER — Other Ambulatory Visit: Payer: Self-pay

## 2022-08-30 MED ORDER — HYDROCHLOROTHIAZIDE 12.5 MG PO CAPS: 12.5000 mg | ORAL_CAPSULE | Freq: Every day | ORAL | 3 refills | Status: DC

## 2022-09-02 ENCOUNTER — Emergency Department (HOSPITAL_BASED_OUTPATIENT_CLINIC_OR_DEPARTMENT_OTHER)
Admission: EM | Admit: 2022-09-02 | Discharge: 2022-09-02 | Disposition: A | Discharge status: Home / Self Care | Payer: Medicare Other | Attending: Emergency Medicine | Admitting: Emergency Medicine

## 2022-09-02 ENCOUNTER — Other Ambulatory Visit: Payer: Self-pay

## 2022-09-02 ENCOUNTER — Encounter (HOSPITAL_BASED_OUTPATIENT_CLINIC_OR_DEPARTMENT_OTHER): Payer: Self-pay | Admitting: Emergency Medicine

## 2022-09-02 DIAGNOSIS — S0101XD Laceration without foreign body of scalp, subsequent encounter: Secondary | ICD-10-CM | POA: Diagnosis not present

## 2022-09-02 DIAGNOSIS — X58XXXD Exposure to other specified factors, subsequent encounter: Secondary | ICD-10-CM | POA: Insufficient documentation

## 2022-09-02 DIAGNOSIS — Z4802 Encounter for removal of sutures: Secondary | ICD-10-CM | POA: Diagnosis present

## 2022-09-02 NOTE — ED Triage Notes (Signed)
Pt arrives to ED to have staples removed from back of scalp.

## 2022-09-02 NOTE — ED Provider Notes (Signed)
[name] EMERGENCY DEPARTMENT AT [facility] Provider Note   CSN: [id number] Arrival date & time: 09/02/22  0707     History  Chief Complaint  Patient presents with   Suture / Staple Removal    Lawrence [name] is a 72 y.o. male.  Patient here for staple removal to top of his head.  He has been doing well.  No issues otherwise.  Denies any fevers or chills.  Placed maybe 10 days ago.  No issues with the wound.  The history is provided by the patient.       Home Medications Prior to Admission medications   Medication Sig Start Date End Date Taking? Authorizing Provider  atorvastatin (LIPITOR) 80 MG tablet TAKE 1 TABLET BY MOUTH EVERY DAY 11/20/21   Swinyer, Zachary George, NP  co-enzyme Q-10 50 MG capsule Take 50 mg by mouth every evening.    [provider]  hydrochlorothiazide (MICROZIDE) 12.5 MG capsule Take 1 capsule (12.5 mg total) by mouth daily. Please keep upcoming appt.with Cardiologist on 9/26 to receive future refills. 08/30/22   Swinyer, Zachary George, NP  lisinopril (ZESTRIL) 20 MG tablet TAKE 1 TABLET DAILY. PLEASE CALL TO SCHEDULE AN OVERDUE APPOINTMENT 02/03/22   Swinyer, Zachary George, NP  metoprolol succinate (TOPROL-XL) 50 MG 24 hr tablet TAKE 1 TABLET BY MOUTH EVERY DAY 01/05/22   Lyn Records, MD  nitroGLYCERIN (NITROSTAT) 0.4 MG SL tablet PLACE 1 TABLET (0.4 MG TOTAL) UNDER THE TONGUE EVERY 5 (FIVE) MINUTES AS NEEDED FOR CHEST PAIN. 09/15/21   Lyn Records, MD      Allergies    Patient has no known allergies.    Review of Systems   Review of Systems  Physical Exam Updated Vital Signs BP (!) 152/98 (BP Location: Right Arm)   Pulse 80   Temp 97.8 F (36.6 C) (Oral)   Resp 15   SpO2 98%  Physical Exam Skin:    Comments: Staples on scalp with wound looks clean dry and intact no purulent drainage  Neurological:     Mental Status: He is alert.     ED Results / Procedures / Treatments   Labs (all labs ordered are listed, but only  abnormal results are displayed) Labs Reviewed - No data to display  EKG None  Radiology No results found.  Procedures .Suture Removal  Date/Time: 09/02/2022 7:20 AM  Performed by: Virgina Norfolk, DO Authorized by: Virgina Norfolk, DO   Consent:    Consent obtained:  Verbal   Consent given by:  Patient   Risks, benefits, and alternatives were discussed: yes     Risks discussed:  Bleeding, pain and wound separation Universal protocol:    Procedure explained and questions answered to patient or proxy's satisfaction: yes     Immediately prior to procedure, a time out was called: yes     Patient identity confirmed:  Verbally with patient Location:    Location:  Head/neck   Head/neck location:  Scalp Procedure details:    Wound appearance:  No signs of infection, good wound healing and clean   Number of staples removed:  5 Post-procedure details:    Post-removal:  No dressing applied   Procedure completion:  Tolerated     Medications Ordered in ED Medications - No data to display  ED Course/ Medical Decision Making/ A&P  Medical Decision Making  Lawrence [name] is here for staple removal.  Placed about 10 days ago.  Had 5 staples placed to his scalp after fall.  Wound is clean dry and intact.  Staples were removed and tolerated well.  Wound care instructions given.  Discharged in good condition.  Understands return precautions.  This chart was dictated using voice recognition software.  Despite best efforts to proofread,  errors can occur which can change the documentation meaning.         Final Clinical Impression(s) / ED Diagnoses Final diagnoses:  Encounter for staple removal    Rx / DC Orders ED Discharge Orders     None         Virgina Norfolk, DO 09/02/22 0720

## 2022-10-05 NOTE — Progress Notes (Unsigned)
Cardiology Clinic Note   Patient Name: Lawrence [name] Date of Encounter: 10/07/2022  Primary Care Provider:  Andi Devon, MD Primary Cardiologist: Dr. Duke [name] (formerly Dr. Charisse [name] established for follow up's).   Patient Profile    72 year old male with history of coronary artery disease, NSTEMI in 2014 (apical infarct) with unsuccessful POBA of distal LAD, follow-up Myoview and 10/31/2018 low risk, hypertension, hyperlipidemia, and OSA.  Last seen in the office on 10/15/2020 by Lawrence Newcomer, PA, for preoperative cardiac clearance to have left total hip replacement.   Past Medical History    Past Medical History:  Diagnosis Date   Anxiety    Arthritis    hips   CAD (coronary artery disease)    a. NSTEMI 1/14 - LHC: Ostial/proximal LAD 50%, distal LAD occluded near the apex with faint left to left collaterals, EF 50% with apical akinesis.;  b. Echo 1/14: Severe LVH, EF 50-55%, apical HK, mild LAE;  c. ETT-Myoview 3/14: EF 69%, apical scar, no ischemia;   d. ETT 12/15: no ST ischemic ST changes    Coronary artery disease involving native heart 11/29/2013   Occluded apical LAD. 50% proximal to mid LAD. Apical infarct January 2014.    GERD (gastroesophageal reflux disease)    OTC   History of MI (myocardial infarction)    HTN (hypertension) 01/23/2012   Hyperlipidemia 01/24/2012   LDL > 160 on admission    Hyperlipidemia LDL goal < 70    Hypertension    Left ventricular aneurysm    NSTEMI. old. 01/23/2012   Obstructive sleep apnea 01/26/2012   Greater than 20 year history    Pneumonia    many years ago   Sleep apnea    on CPAP   Past Surgical History:  Procedure Laterality Date   CARDIAC CATHETERIZATION     2014   CORONARY STENT PLACEMENT     HERNIA REPAIR Left    inguinal    LEFT HEART CATHETERIZATION WITH CORONARY ANGIOGRAM N/A 01/23/2012   Procedure: LEFT HEART CATHETERIZATION WITH CORONARY ANGIOGRAM;  Surgeon: Lawrence Noe, MD;  Location: [facility] CATH LAB;   Service: Cardiovascular;  Laterality: N/A;   TOTAL HIP ARTHROPLASTY Right 2018   TOTAL HIP ARTHROPLASTY Left 11/12/2020   Procedure: TOTAL HIP ARTHROPLASTY ANTERIOR APPROACH;  Surgeon: Lawrence Gross, MD;  Location: [facility] ORS;  Service: Orthopedics;  Laterality: Left;    Allergies  No Known Allergies  History of Present Illness    Lawrence [name] returns today after not being seen for 2 years for ongoing assessment and management of coronary artery disease, history of NSTEMI with unsuccessful POBA of the distal LAD.  He is last seen by Lawrence [name] in 2022 and needs to be established with new cardiologist since Dr. Michaelle [name] retirement.  Since being seen last the patient has had no new diagnoses, allergies, or hospitalizations.  The patient states that he walks 3 to 4 miles 3 times a week, without any angina symptoms, dyspnea on exertion, fatigue, or inability to complete daily tasks or exercise regimen due to symptoms.  He has been medically compliant with medications provided by his primary care provider Dr. Andi [name].  Labs have also been completed by primary care provider.   Home Medications    Current Outpatient Medications  Medication Sig Dispense Refill   atorvastatin (LIPITOR) 80 MG tablet TAKE 1 TABLET BY MOUTH EVERY DAY 90 tablet 2   co-enzyme Q-10 50 MG capsule Take 50 mg by mouth every evening.  hydrochlorothiazide (MICROZIDE) 12.5 MG capsule Take 1 capsule (12.5 mg total) by mouth daily. Please keep upcoming appt.with Cardiologist on 9/26 to receive future refills. 90 capsule 3   lisinopril (ZESTRIL) 20 MG tablet TAKE 1 TABLET DAILY. PLEASE CALL TO SCHEDULE AN OVERDUE APPOINTMENT 90 tablet 1   metoprolol succinate (TOPROL-XL) 50 MG 24 hr tablet TAKE 1 TABLET BY MOUTH EVERY DAY 90 tablet 3   nitroGLYCERIN (NITROSTAT) 0.4 MG SL tablet PLACE 1 TABLET (0.4 MG TOTAL) UNDER THE TONGUE EVERY 5 (FIVE) MINUTES AS NEEDED FOR CHEST PAIN. 25 tablet 9   No current facility-administered  medications for this visit.     Family History    Family History  Problem Relation Age of Onset   Hypertension Mother    Fainting Neg Hx    Heart attack Neg Hx    Heart disease Neg Hx    Heart failure Neg Hx    Hyperlipidemia Neg Hx    Anemia Neg Hx    Asthma Neg Hx    Clotting disorder Neg Hx    He indicated that his mother is deceased. He indicated that his father is deceased. He indicated that his maternal grandmother is deceased. He indicated that his maternal grandfather is deceased. He indicated that his paternal grandmother is deceased. He indicated that his paternal grandfather is deceased. He indicated that the status of his neg hx is unknown.  Social History    Social History   Socioeconomic History   Marital status: Married    Spouse name: Not on file   Number of children: Not on file   Years of education: Not on file   Highest education level: Not on file  Occupational History   Occupation: Estate agent  Tobacco Use   Smoking status: Former    Types: Cigars    Quit date: 01/12/2012    Years since quitting: 10.7   Smokeless tobacco: Never  Vaping Use   Vaping status: Never Used  Substance and Sexual Activity   Alcohol use: No   Drug use: No   Sexual activity: Not Currently  Other Topics Concern   Not on file  Social History Narrative   Not on file   Social Determinants of Health   Financial Resource Strain: Not on file  Food Insecurity: Not on file  Transportation Needs: Not on file  Physical Activity: Not on file  Stress: Not on file  Social Connections: Not on file  Intimate Partner Violence: Not on file     Review of Systems    General:  No chills, fever, night sweats or weight changes.  Cardiovascular:  No chest pain, dyspnea on exertion, edema, orthopnea, palpitations, paroxysmal nocturnal dyspnea. Dermatological: No rash, lesions/masses Respiratory: No cough, dyspnea Urologic: No hematuria, dysuria Abdominal:   No nausea,  vomiting, diarrhea, bright red blood per rectum, melena, or hematemesis Neurologic:  No visual changes, wkns, changes in mental status. All other systems reviewed and are otherwise negative except as noted above.  EKG Interpretation Date/Time:  Thursday October 07 2022 08:31:12 EDT Ventricular Rate:  82 PR Interval:  198 QRS Duration:  92 QT Interval:  378 QTC Calculation: 441 R Axis:   -66  Text Interpretation: Normal sinus rhythm Left anterior fascicular block Cannot rule out Inferior infarct (masked by fascicular block?) , age undetermined Possible Anterolateral infarct , age undetermined When compared with ECG of 26-Jul-2016 16:19, PREVIOUS ECG IS PRESENT Confirmed by Joni Reining [phone]) on 10/07/2022 8:48:26 AM    Physical  Exam    VS:  BP 122/70   Pulse 86   Ht 5\' 11"  (1.803 m)   Wt 216 lb 9.6 oz (98.2 kg)   SpO2 96%   BMI 30.21 kg/m  , BMI Body mass index is 30.21 kg/m.     GEN: Well nourished, well developed, in no acute distress. HEENT: normal. Neck: Supple, no JVD, carotid bruits, or masses. Cardiac: RRR, no murmurs, rubs, or gallops. No clubbing, cyanosis, edema.  Radials/DP/PT 2+ and equal bilaterally.  Respiratory:  Respirations regular and unlabored, clear to auscultation bilaterally. GI: Soft, nontender, nondistended, BS + x 4. MS: no deformity or atrophy. Skin: warm and dry, no rash. Neuro:  Strength and sensation are intact. Psych: Normal affect.  EKG Interpretation Date/Time:  Thursday October 07 2022 08:31:12 EDT Ventricular Rate:  82 PR Interval:  198 QRS Duration:  92 QT Interval:  378 QTC Calculation: 441 R Axis:   -66  Text Interpretation: Normal sinus rhythm Left anterior fascicular block Cannot rule out Inferior infarct (masked by fascicular block?) , age undetermined Possible Anterolateral infarct , age undetermined When compared with ECG of 26-Jul-2016 16:19, PREVIOUS ECG IS PRESENT Confirmed by Joni Reining [phone]) on 10/07/2022  8:48:26 AM   Lab Results  Component Value Date   WBC 11.6 (H) 11/13/2020   HGB 13.5 11/13/2020   HCT 39.4 11/13/2020   MCV 96.8 11/13/2020   PLT 203 11/13/2020   Lab Results  Component Value Date   CREATININE 0.96 11/13/2020   BUN 13 11/13/2020   NA 136 11/13/2020   K 4.1 11/13/2020   CL 103 11/13/2020   CO2 24 11/13/2020   Lab Results  Component Value Date   ALT 27 10/31/2020   AST 29 10/31/2020   ALKPHOS 59 10/31/2020   BILITOT 0.9 10/31/2020   Lab Results  Component Value Date   CHOL 189 06/25/2016   HDL 49 06/25/2016   LDLCALC 102 (H) 06/25/2016   TRIG 191 (H) 06/25/2016   CHOLHDL 3.9 06/25/2016    Lab Results  Component Value Date   HGBA1C 5.6 01/24/2012     Review of Prior Studies EKG Interpretation Date/Time:  Thursday October 07 2022 08:31:12 EDT Ventricular Rate:  82 PR Interval:  198 QRS Duration:  92 QT Interval:  378 QTC Calculation: 441 R Axis:   -66  Text Interpretation: Normal sinus rhythm Left anterior fascicular block Cannot rule out Inferior infarct (masked by fascicular block?) , age undetermined Possible Anterolateral infarct , age undetermined When compared with ECG of 26-Jul-2016 16:19, PREVIOUS ECG IS PRESENT Confirmed by Joni Reining [phone]) on 10/07/2022 8:48:26 AM    GATED SPECT MYO PERF W/LEXISCAN STRESS 1D 10/18/2018 Narrative  Nuclear stress EF: 60%.  There was no ST segment deviation noted during stress.  The study is normal.  This is a low risk study.  The left ventricular ejection fraction is normal (55-65%). Normal pharmacologic nuclear stress test with no evidence for prior infarct or ischemia.   ETT 12/15 No evidence of ischemia by ST analysis   LHC (01/2012):  Ostial/proximal LAD 50%, distal LAD occluded near the apex with faint left to left collaterals, EF 50% with apical akinesis.   PCI: Balloon angioplasty of the distal LAD unsuccessful.   Echocardiogram (01/26/12):  Severe LVH, EF 50-55%, severe  hypokinesis of the apical myocardium, mild LAE.   ETT-Myoview (03/2012):  Apical scar, no ischemia, EF 69%.  Assessment & Plan   1.  Coronary artery disease: History of NSTEMI in  2014 Francies apical infarct, POBA of the distal LAD with unsuccessful he is now being treated medically.  He is currently asymptomatic and has had a follow-up Myoview in 2020 which was low risk.  Remains on metoprolol succinate 50 mg daily.  Continue secondary prevention with blood pressure control, lipid management, continue purposeful exercise, and low-cholesterol diet.  2.  Hypertension: Blood pressure is excellently controlled today.  Continue hydrochlorothiazide 12.5 mg daily lisinopril 20 mg daily as directed.  3.  Hypercholesterolemia: Goal of LDL less than 70.  Labs are completed by primary care provider Dr. Renae Gloss with [facility].  On review of care everywhere I do not have access to recent labs.  Will request these from primary care provider for our own documentation.       Patient is being reassigned to Dr. Chilton Si in the setting of Lawrence [name] two thirds retirement.  Follow-up appointment will be made with her to have introduction and establishment as his cardiologist.  Signed, Bettey Mare. Liborio Nixon, ANP, AACC   10/07/2022 9:45 AM      Office [phone] Fax [phone]  Notice: This dictation was prepared with Dragon dictation along with smaller phrase technology. Any transcriptional errors that result from this process are unintentional and may not be corrected upon review.

## 2022-10-06 ENCOUNTER — Other Ambulatory Visit: Payer: Self-pay | Admitting: Nurse Practitioner

## 2022-10-07 ENCOUNTER — Ambulatory Visit: Payer: Medicare Other | Attending: Adult Health | Admitting: Adult Health

## 2022-10-07 ENCOUNTER — Encounter: Payer: Self-pay | Admitting: Adult Health

## 2022-10-07 VITALS — BP 122/70 | HR 86 | Ht 71.0 in | Wt 216.6 lb

## 2022-10-07 DIAGNOSIS — I2510 Atherosclerotic heart disease of native coronary artery without angina pectoris: Secondary | ICD-10-CM | POA: Diagnosis not present

## 2022-10-07 DIAGNOSIS — E7800 Pure hypercholesterolemia, unspecified: Secondary | ICD-10-CM | POA: Insufficient documentation

## 2022-10-07 DIAGNOSIS — I10 Essential (primary) hypertension: Secondary | ICD-10-CM | POA: Insufficient documentation

## 2022-10-07 NOTE — Patient Instructions (Signed)
Medication Instructions:  NO CHANGES    Lab Work: NONE   Testing/Procedures: NONE   Follow-Up: At [facility], you and your health needs are our priority.  As part of our continuing mission to provide you with exceptional heart care, we have created designated Provider Care Teams.  These Care Teams include your primary Cardiologist (physician) and Advanced Practice Providers (APPs -  Physician Assistants and Nurse Practitioners) who all work together to provide you with the care you need, when you need it.   Your next appointment:   1 year(s)  Provider:   Joni Reining

## 2022-11-02 ENCOUNTER — Encounter: Payer: Self-pay | Admitting: Primary Care

## 2022-11-02 ENCOUNTER — Ambulatory Visit (INDEPENDENT_AMBULATORY_CARE_PROVIDER_SITE_OTHER): Payer: Medicare Other | Admitting: Primary Care

## 2022-11-02 VITALS — BP 130/80 | HR 65 | Ht 70.0 in | Wt 217.0 lb

## 2022-11-02 DIAGNOSIS — G4733 Obstructive sleep apnea (adult) (pediatric): Secondary | ICD-10-CM | POA: Diagnosis not present

## 2022-11-02 NOTE — Patient Instructions (Signed)
Orders: New CPAP- setting 8cm h20  Follow-up: 3 months with Waynetta Sandy NP virtual visit

## 2022-11-02 NOTE — Progress Notes (Signed)
@Patient  ID: Lawrence [name], male    DOB: [birth year], 72 y.o.   MRN: [id number]  Chief Complaint  Patient presents with   Follow-up    Referring provider: Andi Devon, MD  HPI: 72 year old male, former smoker. PMH significant for HTN, NSTEMI, CAD, OSA, hyperlipidemia. Former Dr. Craige Cotta patient.   11/02/2022 Patient presents today for OSA follow-up. He is doing well. He remains compliant with CPAP use, download showed patient has worn CPAP 100% of the time > 4 hours over the last 30 days. He reports benefit from use. He needs an order for replacement CPAP machine. Current machine is >71 years old. He originally received machine from [facility], he has been getting supplies through [facility].   [facility] download 10/02/22-10/31/22 30/30 days (100%) greater than 4 hours Average usage 8 hours 24 minutes Pressure 8 cm H2O Air leaks 48.1 L/min (95%) AHI 0.6   No Known Allergies  Immunization History  Administered Date(s) Administered   Influenza,inj,Quad PF,6+ Mos 11/18/2016   Moderna Sars-Covid-2 Vaccination 03/13/2019, 04/10/2019   PFIZER(Purple Top)SARS-COV-2 Vaccination 09/04/2019   Pneumococcal Polysaccharide-23 11/18/2016   Tdap 08/22/2022    Past Medical History:  Diagnosis Date   Anxiety    Arthritis    hips   CAD (coronary artery disease)    a. NSTEMI 1/14 - LHC: Ostial/proximal LAD 50%, distal LAD occluded near the apex with faint left to left collaterals, EF 50% with apical akinesis.;  b. Echo 1/14: Severe LVH, EF 50-55%, apical HK, mild LAE;  c. ETT-Myoview 3/14: EF 69%, apical scar, no ischemia;   d. ETT 12/15: no ST ischemic ST changes    Coronary artery disease involving native heart 11/29/2013   Occluded apical LAD. 50% proximal to mid LAD. Apical infarct January 2014.    GERD (gastroesophageal reflux disease)    OTC   History of MI (myocardial infarction)    HTN (hypertension) 01/23/2012   Hyperlipidemia 01/24/2012   LDL > 160 on admission     Hyperlipidemia LDL goal < 70    Hypertension    Left ventricular aneurysm    NSTEMI. old. 01/23/2012   Obstructive sleep apnea 01/26/2012   Greater than 20 year history    Pneumonia    many years ago   Sleep apnea    on CPAP    Tobacco History: Social History   Tobacco Use  Smoking Status Former   Types: Cigars   Quit date: 01/12/2012   Years since quitting: 10.8  Smokeless Tobacco Never   Counseling given: Not Answered   Outpatient Medications Prior to Visit  Medication Sig Dispense Refill   atorvastatin (LIPITOR) 80 MG tablet TAKE 1 TABLET BY MOUTH EVERY DAY 90 tablet 2   co-enzyme Q-10 50 MG capsule Take 50 mg by mouth every evening.     hydrochlorothiazide (MICROZIDE) 12.5 MG capsule Take 1 capsule (12.5 mg total) by mouth daily. Please keep upcoming appt.with Cardiologist on 9/26 to receive future refills. 90 capsule 3   lisinopril (ZESTRIL) 20 MG tablet TAKE 1 TABLET BY MOUTH EVERY DAY. NEED APPT 90 tablet 3   metoprolol succinate (TOPROL-XL) 50 MG 24 hr tablet TAKE 1 TABLET BY MOUTH EVERY DAY 90 tablet 3   nitroGLYCERIN (NITROSTAT) 0.4 MG SL tablet PLACE 1 TABLET (0.4 MG TOTAL) UNDER THE TONGUE EVERY 5 (FIVE) MINUTES AS NEEDED FOR CHEST PAIN. 25 tablet 9   No facility-administered medications prior to visit.      Review of Systems  Review of Systems  Constitutional:  Negative.   HENT: Negative.    Respiratory: Negative.    Cardiovascular: Negative.      Physical Exam  BP 130/80 (BP Location: Left Arm, Cuff Size: Large)   Pulse 65   Ht 5\' 10"  (1.778 m)   Wt 217 lb (98.4 kg)   SpO2 98%   BMI 31.14 kg/m  Physical Exam Constitutional:      Appearance: Normal appearance.  HENT:     Head: Normocephalic and atraumatic.  Cardiovascular:     Rate and Rhythm: Normal rate and regular rhythm.  Pulmonary:     Effort: Pulmonary effort is normal.     Breath sounds: Normal breath sounds.  Neurological:     General: No focal deficit present.     Mental Status:  He is alert and oriented to person, place, and time. Mental status is at baseline.  Psychiatric:        Mood and Affect: Mood normal.        Behavior: Behavior normal.        Thought Content: Thought content normal.        Judgment: Judgment normal.      Lab Results:  CBC    Component Value Date/Time   WBC 11.6 (H) 11/13/2020 0318   RBC 4.07 (L) 11/13/2020 0318   HGB 13.5 11/13/2020 0318   HCT 39.4 11/13/2020 0318   PLT 203 11/13/2020 0318   MCV 96.8 11/13/2020 0318   MCH 33.2 11/13/2020 0318   MCHC 34.3 11/13/2020 0318   RDW 12.5 11/13/2020 0318   LYMPHSABS 3.1 07/12/2015 1655   MONOABS 0.4 07/12/2015 1655   EOSABS 0.2 07/12/2015 1655   BASOSABS 0.0 07/12/2015 1655    BMET    Component Value Date/Time   NA 136 11/13/2020 0318   K 4.1 11/13/2020 0318   CL 103 11/13/2020 0318   CO2 24 11/13/2020 0318   GLUCOSE 206 (H) 11/13/2020 0318   BUN 13 11/13/2020 0318   CREATININE 0.96 11/13/2020 0318   CALCIUM 8.4 (L) 11/13/2020 0318   GFRNONAA >60 11/13/2020 0318   GFRAA >60 07/26/2016 1632    BNP No results found for: "BNP"  ProBNP    Component Value Date/Time   PROBNP 128.6 (H) 01/23/2012 1159    Imaging: No results found.   Assessment & Plan:   Obstructive sleep apnea - Patient has a long standing hx sleep apnea, maintained on CPAP therapy. Current machine is >18 years old. He needs order for replacement. He remains complaint with use, using 100% of the time >4 hours over the last 30 days. Current pressure 8cm h20; Residual AHI 0.6/hour. No changes. DME order placed. FU in 31-90 days for CPAP compliance.    Orders: New CPAP- setting 8cm h20  Follow-up: 3 months with Waynetta Sandy NP virtual visit   Glenford Bayley, NP 11/21/2022

## 2022-11-18 ENCOUNTER — Ambulatory Visit: Payer: Medicare Other | Admitting: Internal Medicine

## 2022-11-21 NOTE — Assessment & Plan Note (Signed)
-   Patient has a long standing hx sleep apnea, maintained on CPAP therapy. Current machine is >70 years old. He needs order for replacement. He remains complaint with use, using 100% of the time >4 hours over the last 30 days. Current pressure 8cm h20; Residual AHI 0.6/hour. No changes. DME order placed. FU in 31-90 days for CPAP compliance.

## 2022-12-31 ENCOUNTER — Other Ambulatory Visit: Payer: Self-pay

## 2023-01-01 ENCOUNTER — Other Ambulatory Visit: Payer: Self-pay | Admitting: Nurse Practitioner

## 2023-02-02 ENCOUNTER — Telehealth: Payer: Self-pay

## 2023-02-02 NOTE — Telephone Encounter (Signed)
I called [facility] in regards to a recent dl for the pt tomorrow. Pt has a new cpap machine that has not been set up yet, and the last recent dl is from [phone].

## 2023-02-03 ENCOUNTER — Encounter: Payer: Medicare Other | Admitting: Primary Care

## 2023-02-03 NOTE — Telephone Encounter (Signed)
Appt. From today was to f/u with a download from new CPAP cancelled due to new CPAP not working yet. Called pt. To reschedule for f/u in 8 wks. Left message on voicemail to return call for making his appt.

## 2023-02-09 ENCOUNTER — Other Ambulatory Visit: Payer: Self-pay

## 2023-02-09 MED ORDER — METOPROLOL SUCCINATE ER 50 MG PO TB24: 50.0000 mg | ORAL_TABLET | Freq: Every day | ORAL | 2 refills | Status: DC

## 2023-05-16 NOTE — Progress Notes (Signed)
 Subjective Patient ID: Lawrence [name]. is a 73 y.o. male.  Chief Complaint  Patient presents with  . Eye Problem    Pt reports right eye has been red for 1 week. Not itchy or painful. Oozing and crusty this morning. No changes to vision.  . Cough    Cough for a few days that is worse at night. States he normally gets bronchitis this time of year.     The following information was reviewed by members of the visit team:  Tobacco  Allergies  Meds  Med Hx  Surg Hx  Fam Hx  Soc Hx     73 year old male presents for evaluation of cough, nasal congestion, rhinorrhea for the past 3 to 4 days.  Cough has been mildly productive of sputum.  Is seemingly worse at night.  No associated shortness of breath or chest tightness.  He does report history of bronchitis in the past.  Over the same time.  He has also noticed some redness and irritation to the right eye.  This morning he noticed some drainage and crusting on the eyelashes upon awakening.  Denies injury to the eye.  No ocular pain.  No change in vision.  There have been no recent fevers, chills, nausea, vomiting.  Patient reports recent travel and would like COVID test for evaluation    Review of Systems  Constitutional:  Negative for chills and fever.  HENT:  Positive for congestion and rhinorrhea. Negative for ear pain, sinus pressure, sinus pain and sore throat.   Eyes:  Positive for discharge and redness. Negative for photophobia, pain and visual disturbance.  Respiratory:  Positive for cough. Negative for shortness of breath and wheezing.   Cardiovascular:  Negative for chest pain.  Gastrointestinal:  Negative for diarrhea, nausea and vomiting.  Musculoskeletal:  Negative for arthralgias and myalgias.  Skin:  Negative for rash.  Neurological:  Negative for dizziness and headaches.  Hematological:  Negative for adenopathy. Does not bruise/bleed easily.    Objective Physical Exam Vitals reviewed.  Constitutional:       General: He is not in acute distress.    Appearance: Normal appearance. He is not ill-appearing, toxic-appearing or diaphoretic.  HENT:     Right Ear: Tympanic membrane, ear canal and external ear normal.     Left Ear: Tympanic membrane, ear canal and external ear normal.     Nose: Congestion present. No rhinorrhea.     Mouth/Throat:     Mouth: Mucous membranes are moist.     Pharynx: No oropharyngeal exudate or posterior oropharyngeal erythema.  Eyes:     General:        Right eye: Discharge present.     Extraocular Movements: Extraocular movements intact.     Pupils: Pupils are equal, round, and reactive to light.     Comments: Mild conjunctival injection on right  Cardiovascular:     Rate and Rhythm: Normal rate and regular rhythm.  Pulmonary:     Effort: Pulmonary effort is normal. No respiratory distress.  Musculoskeletal:     Cervical back: Normal range of motion.  Skin:    General: Skin is warm and dry.     Findings: No rash.  Neurological:     Mental Status: He is alert and oriented to person, place, and time.  Psychiatric:        Mood and Affect: Mood normal.     Assessment/Plan 73 year old male with congestion, cough, right eye redness and drainage for the past  3 to 4 days POC COVID test performed per patient request, result is negative Symptoms and exam most consistent with viral illness and associated viral conjunctivitis. No obvious signs of bacterial infection on physical exam Recommend continued use of over-the-counter decongestants.  Will prescribe Phenergan DM due to cough.  Will also provide prescription for prednisone as patient states this has been helpful for his symptoms in the past. Will also send prescription for Polytrim drops per request though patient advised that ductulitis symptoms are likely viral in nature.  Recommend follow-up with PCP as needed for persistent symptoms Urgent Care Disposition:  Home Care    Electronically signed: Franky Floria Finder, PA-C 05/16/2023  10:49 AM

## 2023-07-24 ENCOUNTER — Other Ambulatory Visit: Payer: Self-pay

## 2023-07-24 ENCOUNTER — Emergency Department (HOSPITAL_BASED_OUTPATIENT_CLINIC_OR_DEPARTMENT_OTHER)
Admission: EM | Admit: 2023-07-24 | Discharge: 2023-07-24 | Disposition: A | Discharge status: Home / Self Care | Source: Ambulatory Visit

## 2023-07-24 DIAGNOSIS — Z79899 Other long term (current) drug therapy: Secondary | ICD-10-CM | POA: Insufficient documentation

## 2023-07-24 DIAGNOSIS — F410 Panic disorder [episodic paroxysmal anxiety] without agoraphobia: Secondary | ICD-10-CM | POA: Insufficient documentation

## 2023-07-24 LAB — CBC WITH DIFFERENTIAL/PLATELET
Abs Immature Granulocytes: 0.01 K/uL (ref 0.00–0.07)
Basophils Absolute: 0.1 K/uL (ref 0.0–0.1)
Basophils Relative: 1 %
Eosinophils Absolute: 0.1 K/uL (ref 0.0–0.5)
Eosinophils Relative: 3 %
HCT: 42.5 % (ref 39.0–52.0)
Hemoglobin: 14.5 g/dL (ref 13.0–17.0)
Immature Granulocytes: 0 %
Lymphocytes Relative: 42 %
Lymphs Abs: 2.3 K/uL (ref 0.7–4.0)
MCH: 33 pg (ref 26.0–34.0)
MCHC: 34.1 g/dL (ref 30.0–36.0)
MCV: 96.8 fL (ref 80.0–100.0)
Monocytes Absolute: 0.6 K/uL (ref 0.1–1.0)
Monocytes Relative: 11 %
Neutro Abs: 2.4 K/uL (ref 1.7–7.7)
Neutrophils Relative %: 43 %
Platelets: 195 K/uL (ref 150–400)
RBC: 4.39 MIL/uL (ref 4.22–5.81)
RDW: 12.8 % (ref 11.5–15.5)
WBC: 5.6 K/uL (ref 4.0–10.5)
nRBC: 0 % (ref 0.0–0.2)

## 2023-07-24 LAB — COMPREHENSIVE METABOLIC PANEL WITH GFR
ALT: 26 U/L (ref 0–44)
AST: 33 U/L (ref 15–41)
Albumin: 4.7 g/dL (ref 3.5–5.0)
Alkaline Phosphatase: 78 U/L (ref 38–126)
Anion gap: 11 (ref 5–15)
BUN: 14 mg/dL (ref 8–23)
CO2: 26 mmol/L (ref 22–32)
Calcium: 9.4 mg/dL (ref 8.9–10.3)
Chloride: 101 mmol/L (ref 98–111)
Creatinine, Ser: 1.16 mg/dL (ref 0.61–1.24)
GFR, Estimated: >60 mL/min (ref >60)
Glucose, Bld: 102 mg/dL — ABNORMAL HIGH (ref 70–99)
Potassium: 3.6 mmol/L (ref 3.5–5.1)
Sodium: 139 mmol/L (ref 135–145)
Total Bilirubin: 0.5 mg/dL (ref 0.0–1.2)
Total Protein: 7.6 g/dL (ref 6.5–8.1)

## 2023-07-24 LAB — URINALYSIS, ROUTINE W REFLEX MICROSCOPIC
Bilirubin Urine: NEGATIVE
Glucose, UA: NEGATIVE mg/dL
Hgb urine dipstick: NEGATIVE
Ketones, ur: NEGATIVE mg/dL
Leukocytes,Ua: NEGATIVE
Nitrite: NEGATIVE
Protein, ur: NEGATIVE mg/dL
Specific Gravity, Urine: 1.014 (ref 1.005–1.030)
pH: 6 (ref 5.0–8.0)

## 2023-07-24 LAB — TROPONIN T, HIGH SENSITIVITY
Troponin T High Sensitivity: <15 ng/L (ref <19)
Troponin T High Sensitivity: <15 ng/L (ref <19)

## 2023-07-24 LAB — TSH: TSH: 3.1 u[IU]/mL (ref 0.350–4.500)

## 2023-07-24 NOTE — ED Provider Notes (Signed)
 [place] EMERGENCY DEPARTMENT AT [facility] Provider Note   CSN: [id number] Arrival date & time: 07/24/23  8046     Patient presents with: No chief complaint on file.   Lawrence [name] is a 73 y.o. male.   HPI     Patient presents due to concern for possible panic attack.  Patient states this happened now twice in the past week.  Patient states he had a remote history of panic attack that happened about 10 years ago but otherwise has not had any kind of previous panic attack since then.  Patient states that he was sitting in a movie theater today with people send around him he started feeling slightly claustrophobic.  Subsequently had to step out of the movie theater.  Went to urgent care today.  They referred him to the ED because of his previous history of cardiac pathology.  Patient currently denies any, chest pain or shortness of breath.  Denies any nausea vomit diarrhea.  Denies any headaches or vision changes.  No speech difficulty.  Patient states he is feeling back to baseline at this point in time.  He has absolutely no complaints.  No recent exertional chest pain.  Has been compliant with all of his medication.   Previous medical history reviewed :  Last seen in the ED 09/02/22 due to suture removal   Prior to Admission medications   Medication Sig Start Date End Date Taking? Authorizing Provider  atorvastatin  (LIPITOR ) 80 MG tablet TAKE 1 TABLET BY MOUTH EVERY DAY 01/03/23   Swinyer, Rosaline HERO, NP  co-enzyme Q-10 50 MG capsule Take 50 mg by mouth every evening.    [provider]  hydrochlorothiazide  (MICROZIDE ) 12.5 MG capsule Take 1 capsule (12.5 mg total) by mouth daily. Please keep upcoming appt.with Cardiologist on 9/26 to receive future refills. 08/30/22   Swinyer, Rosaline HERO, NP  lisinopril  (ZESTRIL ) 20 MG tablet TAKE 1 TABLET BY MOUTH EVERY DAY. NEED APPT 10/07/22   Jerilynn Lamarr HERO, NP  metoprolol  succinate (TOPROL -XL) 50 MG 24 hr tablet Take 1  tablet (50 mg total) by mouth daily. Take with or immediately following a meal. 02/09/23   Jerilynn Lamarr HERO, NP  nitroGLYCERIN  (NITROSTAT ) 0.4 MG SL tablet PLACE 1 TABLET (0.4 MG TOTAL) UNDER THE TONGUE EVERY 5 (FIVE) MINUTES AS NEEDED FOR CHEST PAIN. 09/15/21   Claudene Victory ORN, MD    Allergies: Patient has no known allergies.    Review of Systems  Constitutional:  Negative for chills and fever.  HENT:  Negative for ear pain and sore throat.   Eyes:  Negative for pain and visual disturbance.  Respiratory:  Negative for cough and shortness of breath.   Cardiovascular:  Negative for chest pain and palpitations.  Gastrointestinal:  Negative for abdominal pain and vomiting.  Genitourinary:  Negative for dysuria and hematuria.  Musculoskeletal:  Negative for arthralgias and back pain.  Skin:  Negative for color change and rash.  Neurological:  Negative for seizures and syncope.  All other systems reviewed and are negative.   Updated Vital Signs BP (!) 158/96   Pulse 73   Temp 98.4 F (36.9 C)   Resp 16   SpO2 95%   Physical Exam Vitals and nursing note reviewed.  Constitutional:      General: He is not in acute distress.    Appearance: He is well-developed.  HENT:     Head: Normocephalic and atraumatic.  Eyes:     Conjunctiva/sclera: Conjunctivae normal.  Cardiovascular:     Rate and Rhythm: Normal rate and regular rhythm.     Heart sounds: No murmur heard. Pulmonary:     Effort: Pulmonary effort is normal. No respiratory distress.     Breath sounds: Normal breath sounds.  Abdominal:     Palpations: Abdomen is soft.     Tenderness: There is no abdominal tenderness.  Musculoskeletal:        General: No swelling.     Cervical back: Neck supple.  Skin:    General: Skin is warm and dry.     Capillary Refill: Capillary refill takes less than 2 seconds.  Neurological:     Mental Status: He is alert.  Psychiatric:        Mood and Affect: Mood normal.     (all labs  ordered are listed, but only abnormal results are displayed) Labs Reviewed  COMPREHENSIVE METABOLIC PANEL WITH GFR - Abnormal; Notable for the following components:      Result Value   Glucose, Bld 102 (*)    All other components within normal limits  CBC WITH DIFFERENTIAL/PLATELET  URINALYSIS, ROUTINE W REFLEX MICROSCOPIC  TSH  TROPONIN T, HIGH SENSITIVITY  TROPONIN T, HIGH SENSITIVITY    EKG: EKG Interpretation Date/Time:  Sunday July 24 2023 20:07:02 EDT Ventricular Rate:  78 PR Interval:  187 QRS Duration:  95 QT Interval:  392 QTC Calculation: 447 R Axis:   -72  Text Interpretation: Sinus rhythm Left anterior fascicular block Confirmed by Simon Rea [phone]) on 07/24/2023 8:16:54 PM  Radiology: No results found.   Procedures   Medications Ordered in the ED - No data to display                                  Medical Decision Making Amount and/or Complexity of Data Reviewed Labs: ordered.   Previous medical history reviewed :  Last seen in the ED 09/02/22 due to suture removal    Patient presents due to concern for possible panic attack.  Patient states this happened now twice in the past week.  Patient states he had a remote history of panic attack that happened about 10 years ago but otherwise has not had any kind of previous panic attack since then.  Patient states that he was sitting in a movie theater today with people send around him he started feeling slightly claustrophobic.  Subsequently had to step out of the movie theater.  Went to urgent care today.  They referred him to the ED because of his previous history of cardiac pathology.  Patient currently denies any, chest pain or shortness of breath.  Denies any nausea vomit diarrhea.  Denies any headaches or vision changes.  No speech difficulty.  Patient states he is feeling back to baseline at this point in time.  He has absolutely no complaints.  No recent exertional chest pain.  Has been compliant with all  of his medication.   Previous medical history reviewed :  Last seen in the ED 09/02/22 due to suture removal    Upon exam, patient imaging was stable.  ANO x 3 GCS 15.  NIH is 0.  Cranial nerves II through XII intact.  No focal deficit.  No concerns for CVA or other intracranial pathology.  No indication for CT imaging or other imaging modality of patient's brain.   EKG reviewed.  Sinus rhythm.  No STEMI arrhythmia.  Troponin x  2 undetectable.  No concerns for ACS pathology.  Cardiac telemetry reviewed by me.  No arrhythmia.  Electrolytes normal.  TSH normal.  No UTI.   I do think this is likely anxiety/panic attack.  He can follow-up with PCP regarding this.       Final diagnoses:  Panic attack    ED Discharge Orders     None          Simon Lavonia SAILOR, MD 07/24/23 2311

## 2023-07-24 NOTE — ED Triage Notes (Signed)
 Pt c/o fatigue currently; advises that he was at the movies earlier this evening, felt claustrophobic, anxious. Went to UC for same, hx of panic attacks rare, but happened in the past- this is the 2nd in 1wk. Elevated BP noted approx ago at [facility], sent for further eval r/t cardiac hx.

## 2023-07-24 NOTE — Discharge Instructions (Addendum)
 As we discussed, your workup today was unremarkable.  I do think this is most likely your a panic attack.  As we discussed, if this becomes more of an issue and you keep having these attacks, please follow-up with her primary care physician.  They can start you on medication as well as refer you to behavioral health therapy.   If you ever have any, chest pain or shortness of breath and please come back to the ED for further evaluation.

## 2023-08-16 NOTE — Progress Notes (Signed)
 This encounter was created in error - please disregard.

## 2023-09-13 ENCOUNTER — Encounter (HOSPITAL_BASED_OUTPATIENT_CLINIC_OR_DEPARTMENT_OTHER): Payer: Self-pay | Admitting: Family

## 2023-09-13 ENCOUNTER — Other Ambulatory Visit: Payer: Self-pay | Admitting: Nurse Practitioner

## 2023-09-13 ENCOUNTER — Ambulatory Visit (HOSPITAL_BASED_OUTPATIENT_CLINIC_OR_DEPARTMENT_OTHER): Admitting: Family

## 2023-09-13 VITALS — BP 138/88 | HR 62 | Ht 70.0 in | Wt 216.5 lb

## 2023-09-13 DIAGNOSIS — I10 Essential (primary) hypertension: Secondary | ICD-10-CM

## 2023-09-13 DIAGNOSIS — E785 Hyperlipidemia, unspecified: Secondary | ICD-10-CM

## 2023-09-13 DIAGNOSIS — I25118 Atherosclerotic heart disease of native coronary artery with other forms of angina pectoris: Secondary | ICD-10-CM

## 2023-09-13 MED ORDER — OLMESARTAN MEDOXOMIL-HCTZ 40-12.5 MG PO TABS: 1.0000 | ORAL_TABLET | Freq: Every day | ORAL | 3 refills | Status: AC

## 2023-09-13 NOTE — Progress Notes (Signed)
  Cardiology Office Note   Date:  09/13/2023  ID:  Lawrence [name], DOB [birth year], MRN [id number] PCP: Theo Iha, MD  [facility] Providers Cardiologist:  Annabella Scarce, MD     History of Present Illness Lawrence [name] is a 73 y.o. male with history of CAD s/p NSTEMI with unsuccessful POBA of distal LAD recommended for medical management, HTN, HLD. Follow up myoview  2020 low risk study.  Presents todasy for follow up with his wife. Notes in June blood pressure started to elevate, prior to that time 120s/80s. PCP transitioned from Lisinopril  and hydrochlorothiazide  to combo tablet but has accidentally been taking extra doses of hydrochlorothiazide . Discussed these extra doses are likely cause of his more frequent urination. Discussed causes of elevated BP.  Does note eating out frequently. Walking 3-4 miles twice per week. Does note more stressors. Reports no shortness of breath nor dyspnea on exertion. Reports no chest pain, pressure, or tightness. No edema, orthopnea, PND. Reports no palpitations.    ROS: Please see the history of present illness.    All other systems reviewed and are negative.   Studies Reviewed      Cardiac Studies & Procedures   ______________________________________________________________________________________________   STRESS TESTS  MYOCARDIAL PERFUSION IMAGING 10/18/2018  Interpretation Summary  Nuclear stress EF: 60%.  There was no ST segment deviation noted during stress.  The study is normal.  This is a low risk study.  The left ventricular ejection fraction is normal (55-65%).  Normal pharmacologic nuclear stress test with no evidence for prior infarct or ischemia.            ______________________________________________________________________________________________      Risk Assessment/Calculations           Physical Exam VS:  BP 138/88   Pulse 62   Ht 5' 10 (1.778 m)   Wt 216 lb 8 oz (98.2 kg)   SpO2 97%    BMI 31.06 kg/m        Wt Readings from Last 3 Encounters:  09/13/23 216 lb 8 oz (98.2 kg)  11/02/22 217 lb (98.4 kg)  10/07/22 216 lb 9.6 oz (98.2 kg)    GEN: Well nourished, well developed in no acute distress NECK: No JVD; No carotid bruits CARDIAC: RRR, no murmurs, rubs, gallops RESPIRATORY:  Clear to auscultation without rales, wheezing or rhonchi  ABDOMEN: Soft, non-tender, non-distended EXTREMITIES:  No edema; No deformity   ASSESSMENT AND PLAN  CAD / HLD, LDL goal <70 - Stable with no anginal symptoms. No indication for ischemic evaluation.  GDMT aspirin  81 mg daily, atorvastatin  80 mg daily, Toprol  50 mg daily, PRN nitroglycerin . Heart healthy diet and regular cardiovascular exercise encouraged.    HTN -BP in goal of less than 130/80.  Stop lisinopril -HCTZ start olmesartan -HCTZ 40-12.5 mg daily.  Continue Toprol  50 mg daily.  Recommended to check BP at least an hour after his medications at least 3 times per week.  He has upcoming visit with PCP and anticipating renal function can be rechecked at that time.  If BP not at goal less than 130/80 could consider transition to Olmesartan -hydrochlorothiazide  40-25mg  daily.       Dispo: follow up in 1 year with Dr. Scarce  Signed, Reche GORMAN Finder, NP

## 2023-09-13 NOTE — Patient Instructions (Addendum)
 Medication Instructions:   STOP Lisinopril -hydrochlorothiazide   START Olmesartan -hydrochlorothiazide  40-12.5mg  daily in the morning  CONTINUE Metoprol in the morning  CONTINUE Atorvastatin  every evening  *If you need a refill on your cardiac medications before your next appointment, please call your pharmacy*  Testing/Procedures: Your EKG in July looked good.   Follow-Up: At [facility], you and your health needs are our priority.  As part of our continuing mission to provide you with exceptional heart care, our providers are all part of one team.  This team includes your primary Cardiologist (physician) and Advanced Practice Providers or APPs (Physician Assistants and Nurse Practitioners) who all work together to provide you with the care you need, when you need it.  Your next appointment:   1 year(s)  Provider:   Annabella Scarce, MD, Rosaline Bane, NP, or Reche Finder, NP    We recommend signing up for the patient portal called MyChart.  Sign up information is provided on this After Visit Summary.  MyChart is used to connect with patients for Virtual Visits (Telemedicine).  Patients are able to view lab/test results, encounter notes, upcoming appointments, etc.  Non-urgent messages can be sent to your provider as well.   To learn more about what you can do with MyChart, go to [email or web address].   Other Instructions  Heart Healthy Diet Recommendations: A low-salt diet is recommended. Meats should be grilled, baked, or boiled. Avoid fried foods. Focus on lean protein sources like fish or chicken with vegetables and fruits. The American Heart Association is a Chief Technology Officer!  American Heart Association Diet and Lifeystyle Recommendations   Exercise recommendations: The American Heart Association recommends 150 minutes of moderate intensity exercise weekly. Try 30 minutes of moderate intensity exercise 4-5 times per week. This could include walking, jogging,  or swimming.   Tips to Measure your Blood Pressure Correctly   Here's what you can do to ensure a correct reading:  Don't drink a caffeinated beverage or smoke during the 30 minutes before the test.  Sit quietly for five minutes before the test begins.  During the measurement, sit in a chair with your feet on the floor and your arm supported so your elbow is at about heart level.  The inflatable part of the cuff should completely cover at least 80% of your upper arm, and the cuff should be placed on bare skin, not over a shirt.  Don't talk during the measurement.  Have your blood pressure measured twice, with a brief break in between. If the readings are different by 5 points or more, have it done a third time.  Blood pressure categories  Blood pressure category SYSTOLIC (upper number)  DIASTOLIC (lower number)  Normal Less than 120 mm Hg and Less than 80 mm Hg  Elevated 120-129 mm Hg and Less than 80 mm Hg  High blood pressure: Stage 1 hypertension 130-139 mm Hg or 80-89 mm Hg  High blood pressure: Stage 2 hypertension 140 mm Hg or higher or 90 mm Hg or higher  Hypertensive crisis (consult your doctor immediately) Higher than 180 mm Hg and/or Higher than 120 mm Hg  Source: American Heart Association and American Stroke Association. For more on getting your blood pressure under control, buy Controlling Your Blood Pressure, a Special Health Report from [facility].

## 2023-10-25 ENCOUNTER — Other Ambulatory Visit: Payer: Self-pay | Admitting: Adult Health

## 2024-01-20 ENCOUNTER — Other Ambulatory Visit: Payer: Self-pay | Admitting: Nurse Practitioner

## 2024-01-20 MED ORDER — ATORVASTATIN CALCIUM 80 MG PO TABS: 80.0000 mg | ORAL_TABLET | Freq: Every day | ORAL | 2 refills | Status: AC
